# Patient Record
Sex: Female | Born: 1950 | Race: White | Hispanic: No | Smoking: Never smoker
Health system: Southern US, Community
[De-identification: ages and names within clinical notes are randomized; demographics above are authoritative.]

## PROBLEM LIST (undated history)

## (undated) DIAGNOSIS — T8859XA Other complications of anesthesia, initial encounter: Secondary | ICD-10-CM

## (undated) DIAGNOSIS — K5792 Diverticulitis of intestine, part unspecified, without perforation or abscess without bleeding: Secondary | ICD-10-CM

## (undated) DIAGNOSIS — M1990 Unspecified osteoarthritis, unspecified site: Secondary | ICD-10-CM

## (undated) DIAGNOSIS — Z780 Asymptomatic menopausal state: Secondary | ICD-10-CM

## (undated) DIAGNOSIS — R011 Cardiac murmur, unspecified: Secondary | ICD-10-CM

## (undated) DIAGNOSIS — Z8719 Personal history of other diseases of the digestive system: Secondary | ICD-10-CM

## (undated) DIAGNOSIS — M48061 Spinal stenosis, lumbar region without neurogenic claudication: Secondary | ICD-10-CM

## (undated) DIAGNOSIS — E669 Obesity, unspecified: Secondary | ICD-10-CM

## (undated) DIAGNOSIS — L719 Rosacea, unspecified: Secondary | ICD-10-CM

## (undated) DIAGNOSIS — I350 Nonrheumatic aortic (valve) stenosis: Secondary | ICD-10-CM

## (undated) DIAGNOSIS — G8929 Other chronic pain: Secondary | ICD-10-CM

## (undated) DIAGNOSIS — M25569 Pain in unspecified knee: Secondary | ICD-10-CM

## (undated) DIAGNOSIS — I2510 Atherosclerotic heart disease of native coronary artery without angina pectoris: Secondary | ICD-10-CM

## (undated) DIAGNOSIS — M797 Fibromyalgia: Secondary | ICD-10-CM

## (undated) HISTORY — PX: TONSILLECTOMY: SUR1361

## (undated) HISTORY — PX: OTHER SURGICAL HISTORY: SHX169

## (undated) HISTORY — PX: ORTHOPEDIC SURGERY: SHX850

## (undated) HISTORY — DX: Rosacea, unspecified: L71.9

## (undated) HISTORY — DX: Obesity, unspecified: E66.9

## (undated) HISTORY — DX: Fibromyalgia: M79.7

## (undated) HISTORY — DX: Other chronic pain: G89.29

## (undated) HISTORY — PX: ROTATOR CUFF REPAIR: SHX139

## (undated) HISTORY — DX: Unspecified osteoarthritis, unspecified site: M19.90

## (undated) HISTORY — PX: TOTAL KNEE ARTHROPLASTY: SHX125

## (undated) HISTORY — DX: Pain in unspecified knee: M25.569

## (undated) HISTORY — DX: Asymptomatic menopausal state: Z78.0

## (undated) HISTORY — DX: Spinal stenosis, lumbar region without neurogenic claudication: M48.061

## (undated) HISTORY — DX: Diverticulitis of intestine, part unspecified, without perforation or abscess without bleeding: K57.92

---

## 1982-05-18 HISTORY — PX: DILATION AND CURETTAGE OF UTERUS: SHX78

## 1985-05-18 HISTORY — PX: ANKLE SURGERY: SHX546

## 1999-12-24 ENCOUNTER — Other Ambulatory Visit: Admission: RE | Admit: 1999-12-24 | Discharge: 1999-12-24 | Payer: Self-pay | Admitting: *Deleted

## 2004-12-15 ENCOUNTER — Ambulatory Visit: Payer: Self-pay | Admitting: Internal Medicine

## 2005-03-16 ENCOUNTER — Ambulatory Visit: Payer: Self-pay | Admitting: Internal Medicine

## 2005-04-29 ENCOUNTER — Ambulatory Visit: Payer: Self-pay | Admitting: Internal Medicine

## 2005-06-03 ENCOUNTER — Other Ambulatory Visit: Admission: RE | Admit: 2005-06-03 | Discharge: 2005-06-03 | Payer: Self-pay | Admitting: Obstetrics and Gynecology

## 2005-06-26 ENCOUNTER — Ambulatory Visit: Payer: Self-pay | Admitting: Internal Medicine

## 2005-10-26 ENCOUNTER — Ambulatory Visit: Payer: Self-pay | Admitting: Internal Medicine

## 2005-10-29 ENCOUNTER — Ambulatory Visit: Payer: Self-pay | Admitting: Internal Medicine

## 2006-03-02 ENCOUNTER — Ambulatory Visit: Payer: Self-pay | Admitting: Internal Medicine

## 2006-09-03 ENCOUNTER — Ambulatory Visit: Payer: Self-pay | Admitting: Internal Medicine

## 2006-09-05 ENCOUNTER — Encounter: Admission: RE | Admit: 2006-09-05 | Discharge: 2006-09-05 | Payer: Self-pay | Admitting: Internal Medicine

## 2007-03-16 DIAGNOSIS — Z8739 Personal history of other diseases of the musculoskeletal system and connective tissue: Secondary | ICD-10-CM | POA: Insufficient documentation

## 2007-03-16 DIAGNOSIS — N951 Menopausal and female climacteric states: Secondary | ICD-10-CM | POA: Insufficient documentation

## 2007-03-16 DIAGNOSIS — L719 Rosacea, unspecified: Secondary | ICD-10-CM | POA: Insufficient documentation

## 2007-03-21 ENCOUNTER — Ambulatory Visit: Payer: Self-pay | Admitting: Internal Medicine

## 2007-03-21 DIAGNOSIS — M25569 Pain in unspecified knee: Secondary | ICD-10-CM | POA: Insufficient documentation

## 2007-09-14 ENCOUNTER — Telehealth: Payer: Self-pay | Admitting: Internal Medicine

## 2007-11-09 ENCOUNTER — Ambulatory Visit: Payer: Self-pay | Admitting: Internal Medicine

## 2007-11-09 DIAGNOSIS — M1990 Unspecified osteoarthritis, unspecified site: Secondary | ICD-10-CM | POA: Insufficient documentation

## 2007-12-29 ENCOUNTER — Encounter: Payer: Self-pay | Admitting: Internal Medicine

## 2007-12-29 ENCOUNTER — Telehealth: Payer: Self-pay | Admitting: Internal Medicine

## 2008-01-03 ENCOUNTER — Ambulatory Visit: Payer: Self-pay | Admitting: Internal Medicine

## 2008-01-04 ENCOUNTER — Telehealth (INDEPENDENT_AMBULATORY_CARE_PROVIDER_SITE_OTHER): Payer: Self-pay | Admitting: *Deleted

## 2008-01-11 ENCOUNTER — Ambulatory Visit: Payer: Self-pay | Admitting: Internal Medicine

## 2008-01-11 ENCOUNTER — Ambulatory Visit: Payer: Self-pay | Admitting: Cardiovascular Disease

## 2008-01-11 LAB — CONVERTED CEMR LAB
BUN: 16 mg/dL (ref 6–23)
CO2: 27 meq/L (ref 19–32)
Calcium: 9.4 mg/dL (ref 8.4–10.5)
Chloride: 108 meq/L (ref 96–112)
Creatinine, Ser: 0.6 mg/dL (ref 0.4–1.2)
GFR calc Af Amer: 133 mL/min
GFR calc non Af Amer: 110 mL/min
Glucose, Bld: 73 mg/dL (ref 70–99)
Potassium: 4.4 meq/L (ref 3.5–5.1)
Sodium: 142 meq/L (ref 135–145)

## 2008-01-12 ENCOUNTER — Telehealth (INDEPENDENT_AMBULATORY_CARE_PROVIDER_SITE_OTHER): Payer: Self-pay | Admitting: *Deleted

## 2008-01-24 ENCOUNTER — Ambulatory Visit: Payer: Self-pay | Admitting: Internal Medicine

## 2008-01-25 ENCOUNTER — Encounter: Admission: RE | Admit: 2008-01-25 | Discharge: 2008-04-24 | Payer: Self-pay | Admitting: Internal Medicine

## 2008-02-02 ENCOUNTER — Telehealth (INDEPENDENT_AMBULATORY_CARE_PROVIDER_SITE_OTHER): Payer: Self-pay | Admitting: *Deleted

## 2008-02-03 ENCOUNTER — Encounter: Payer: Self-pay | Admitting: Internal Medicine

## 2008-02-10 ENCOUNTER — Telehealth (INDEPENDENT_AMBULATORY_CARE_PROVIDER_SITE_OTHER): Payer: Self-pay | Admitting: *Deleted

## 2008-03-13 ENCOUNTER — Telehealth (INDEPENDENT_AMBULATORY_CARE_PROVIDER_SITE_OTHER): Payer: Self-pay | Admitting: *Deleted

## 2008-03-14 ENCOUNTER — Encounter: Payer: Self-pay | Admitting: Internal Medicine

## 2008-04-02 ENCOUNTER — Ambulatory Visit: Payer: Self-pay | Admitting: Internal Medicine

## 2008-04-02 DIAGNOSIS — K219 Gastro-esophageal reflux disease without esophagitis: Secondary | ICD-10-CM | POA: Insufficient documentation

## 2008-04-18 ENCOUNTER — Telehealth (INDEPENDENT_AMBULATORY_CARE_PROVIDER_SITE_OTHER): Payer: Self-pay | Admitting: *Deleted

## 2008-04-26 ENCOUNTER — Encounter: Payer: Self-pay | Admitting: Internal Medicine

## 2008-04-26 ENCOUNTER — Encounter: Admission: RE | Admit: 2008-04-26 | Discharge: 2008-06-11 | Payer: Self-pay | Admitting: Internal Medicine

## 2008-04-30 ENCOUNTER — Encounter: Payer: Self-pay | Admitting: Internal Medicine

## 2008-05-04 ENCOUNTER — Encounter: Payer: Self-pay | Admitting: Internal Medicine

## 2008-05-09 ENCOUNTER — Ambulatory Visit: Payer: Self-pay | Admitting: Internal Medicine

## 2008-05-10 ENCOUNTER — Ambulatory Visit: Payer: Self-pay | Admitting: Internal Medicine

## 2008-05-14 ENCOUNTER — Encounter (INDEPENDENT_AMBULATORY_CARE_PROVIDER_SITE_OTHER): Payer: Self-pay | Admitting: *Deleted

## 2008-05-17 ENCOUNTER — Telehealth (INDEPENDENT_AMBULATORY_CARE_PROVIDER_SITE_OTHER): Payer: Self-pay | Admitting: *Deleted

## 2008-05-17 LAB — CONVERTED CEMR LAB
ALT: 15 units/L (ref 0–35)
AST: 21 units/L (ref 0–37)
Albumin: 3.5 g/dL (ref 3.5–5.2)
Alkaline Phosphatase: 69 units/L (ref 39–117)
Basophils Absolute: 0 10*3/uL (ref 0.0–0.1)
Basophils Relative: 0.2 % (ref 0.0–3.0)
Bilirubin, Direct: 0.1 mg/dL (ref 0.0–0.3)
Cholesterol: 187 mg/dL (ref 0–200)
Eosinophils Absolute: 0.2 10*3/uL (ref 0.0–0.7)
Eosinophils Relative: 2.9 % (ref 0.0–5.0)
HCT: 41.5 % (ref 36.0–46.0)
HDL: 51.9 mg/dL (ref >39.0)
Hemoglobin: 14.4 g/dL (ref 12.0–15.0)
LDL Cholesterol: 114 mg/dL — ABNORMAL HIGH (ref 0–99)
Lymphocytes Relative: 48.6 % — ABNORMAL HIGH (ref 12.0–46.0)
MCHC: 34.8 g/dL (ref 30.0–36.0)
MCV: 88.9 fL (ref 78.0–100.0)
Monocytes Absolute: 0.4 10*3/uL (ref 0.1–1.0)
Monocytes Relative: 6.9 % (ref 3.0–12.0)
Neutro Abs: 2.5 10*3/uL (ref 1.4–7.7)
Neutrophils Relative %: 41.4 % — ABNORMAL LOW (ref 43.0–77.0)
Platelets: 195 10*3/uL (ref 150–400)
RBC: 4.67 M/uL (ref 3.87–5.11)
RDW: 12 % (ref 11.5–14.6)
TSH: 2.45 microintl units/mL (ref 0.35–5.50)
Total Bilirubin: 0.7 mg/dL (ref 0.3–1.2)
Total CHOL/HDL Ratio: 3.6
Total Protein: 6.4 g/dL (ref 6.0–8.3)
Triglycerides: 106 mg/dL (ref 0–149)
VLDL: 21 mg/dL (ref 0–40)
Vit D, 1,25-Dihydroxy: 15 — ABNORMAL LOW (ref 30–89)
WBC: 6.1 10*3/uL (ref 4.5–10.5)

## 2008-05-25 ENCOUNTER — Telehealth (INDEPENDENT_AMBULATORY_CARE_PROVIDER_SITE_OTHER): Payer: Self-pay | Admitting: *Deleted

## 2008-05-28 ENCOUNTER — Encounter (INDEPENDENT_AMBULATORY_CARE_PROVIDER_SITE_OTHER): Payer: Self-pay | Admitting: *Deleted

## 2008-06-01 ENCOUNTER — Telehealth (INDEPENDENT_AMBULATORY_CARE_PROVIDER_SITE_OTHER): Payer: Self-pay | Admitting: *Deleted

## 2008-06-08 ENCOUNTER — Ambulatory Visit: Payer: Self-pay | Admitting: Internal Medicine

## 2008-07-06 ENCOUNTER — Telehealth (INDEPENDENT_AMBULATORY_CARE_PROVIDER_SITE_OTHER): Payer: Self-pay | Admitting: *Deleted

## 2008-08-14 ENCOUNTER — Ambulatory Visit: Payer: Self-pay | Admitting: Internal Medicine

## 2008-09-28 ENCOUNTER — Telehealth (INDEPENDENT_AMBULATORY_CARE_PROVIDER_SITE_OTHER): Payer: Self-pay | Admitting: *Deleted

## 2008-10-04 ENCOUNTER — Telehealth: Payer: Self-pay | Admitting: Internal Medicine

## 2008-10-31 ENCOUNTER — Telehealth (INDEPENDENT_AMBULATORY_CARE_PROVIDER_SITE_OTHER): Payer: Self-pay | Admitting: *Deleted

## 2008-12-06 ENCOUNTER — Telehealth (INDEPENDENT_AMBULATORY_CARE_PROVIDER_SITE_OTHER): Payer: Self-pay | Admitting: *Deleted

## 2009-01-15 ENCOUNTER — Telehealth (INDEPENDENT_AMBULATORY_CARE_PROVIDER_SITE_OTHER): Payer: Self-pay | Admitting: *Deleted

## 2009-03-22 ENCOUNTER — Telehealth (INDEPENDENT_AMBULATORY_CARE_PROVIDER_SITE_OTHER): Payer: Self-pay | Admitting: *Deleted

## 2009-04-19 ENCOUNTER — Telehealth (INDEPENDENT_AMBULATORY_CARE_PROVIDER_SITE_OTHER): Payer: Self-pay | Admitting: *Deleted

## 2009-05-03 ENCOUNTER — Ambulatory Visit: Payer: Self-pay | Admitting: Internal Medicine

## 2009-07-23 ENCOUNTER — Telehealth (INDEPENDENT_AMBULATORY_CARE_PROVIDER_SITE_OTHER): Payer: Self-pay | Admitting: *Deleted

## 2009-08-27 ENCOUNTER — Telehealth: Payer: Self-pay | Admitting: Internal Medicine

## 2009-09-13 ENCOUNTER — Ambulatory Visit: Payer: Self-pay | Admitting: Internal Medicine

## 2009-09-24 ENCOUNTER — Telehealth: Payer: Self-pay | Admitting: Internal Medicine

## 2009-10-28 ENCOUNTER — Encounter: Admission: RE | Admit: 2009-10-28 | Discharge: 2010-01-07 | Payer: Self-pay | Admitting: Sports Medicine

## 2009-10-31 ENCOUNTER — Telehealth: Payer: Self-pay | Admitting: Internal Medicine

## 2010-01-17 ENCOUNTER — Telehealth: Payer: Self-pay | Admitting: Internal Medicine

## 2010-02-24 ENCOUNTER — Telehealth: Payer: Self-pay | Admitting: Internal Medicine

## 2010-04-14 ENCOUNTER — Telehealth: Payer: Self-pay | Admitting: Internal Medicine

## 2010-05-05 ENCOUNTER — Encounter: Payer: Self-pay | Admitting: Internal Medicine

## 2010-05-05 ENCOUNTER — Ambulatory Visit: Payer: Self-pay | Admitting: Internal Medicine

## 2010-05-05 DIAGNOSIS — E559 Vitamin D deficiency, unspecified: Secondary | ICD-10-CM | POA: Insufficient documentation

## 2010-05-07 ENCOUNTER — Encounter (INDEPENDENT_AMBULATORY_CARE_PROVIDER_SITE_OTHER): Payer: Self-pay | Admitting: *Deleted

## 2010-05-13 ENCOUNTER — Ambulatory Visit
Admission: RE | Admit: 2010-05-13 | Discharge: 2010-05-13 | Payer: Self-pay | Source: Home / Self Care | Attending: Internal Medicine | Admitting: Internal Medicine

## 2010-05-14 ENCOUNTER — Ambulatory Visit: Payer: Self-pay | Admitting: Internal Medicine

## 2010-05-20 LAB — CONVERTED CEMR LAB: Fecal Occult Bld: NEGATIVE

## 2010-05-26 ENCOUNTER — Encounter: Payer: Self-pay | Admitting: Internal Medicine

## 2010-05-26 ENCOUNTER — Other Ambulatory Visit: Payer: Self-pay | Admitting: Internal Medicine

## 2010-05-26 LAB — BASIC METABOLIC PANEL
BUN: 14 mg/dL (ref 6–23)
CO2: 29 mEq/L (ref 19–32)
Calcium: 9.6 mg/dL (ref 8.4–10.5)
Chloride: 106 mEq/L (ref 96–112)
Creatinine, Ser: 0.5 mg/dL (ref 0.4–1.2)
GFR: 131.2 mL/min (ref >60.00)
Glucose, Bld: 100 mg/dL — ABNORMAL HIGH (ref 70–99)
Potassium: 4.7 mEq/L (ref 3.5–5.1)
Sodium: 142 mEq/L (ref 135–145)

## 2010-05-26 LAB — LIPID PANEL
Cholesterol: 204 mg/dL — ABNORMAL HIGH (ref 0–200)
HDL: 59.2 mg/dL (ref >39.00)
Total CHOL/HDL Ratio: 3
Triglycerides: 68 mg/dL (ref 0.0–149.0)
VLDL: 13.6 mg/dL (ref 0.0–40.0)

## 2010-05-26 LAB — CBC WITH DIFFERENTIAL/PLATELET
Basophils Absolute: 0 10*3/uL (ref 0.0–0.1)
Basophils Relative: 0.3 % (ref 0.0–3.0)
Eosinophils Absolute: 0.2 10*3/uL (ref 0.0–0.7)
Eosinophils Relative: 2.6 % (ref 0.0–5.0)
HCT: 45.7 % (ref 36.0–46.0)
Hemoglobin: 15.3 g/dL — ABNORMAL HIGH (ref 12.0–15.0)
Lymphocytes Relative: 27.3 % (ref 12.0–46.0)
Lymphs Abs: 1.8 10*3/uL (ref 0.7–4.0)
MCHC: 33.4 g/dL (ref 30.0–36.0)
MCV: 90.6 fl (ref 78.0–100.0)
Monocytes Absolute: 0.4 10*3/uL (ref 0.1–1.0)
Monocytes Relative: 5.9 % (ref 3.0–12.0)
Neutro Abs: 4.3 10*3/uL (ref 1.4–7.7)
Neutrophils Relative %: 63.9 % (ref 43.0–77.0)
Platelets: 205 10*3/uL (ref 150.0–400.0)
RBC: 5.04 Mil/uL (ref 3.87–5.11)
RDW: 13.1 % (ref 11.5–14.6)
WBC: 6.7 10*3/uL (ref 4.5–10.5)

## 2010-05-26 LAB — CONVERTED CEMR LAB: Vit D, 25-Hydroxy: 27 ng/mL — ABNORMAL LOW (ref 30–89)

## 2010-05-26 LAB — LDL CHOLESTEROL, DIRECT: Direct LDL: 143.4 mg/dL

## 2010-06-09 ENCOUNTER — Ambulatory Visit (HOSPITAL_COMMUNITY)
Admission: RE | Admit: 2010-06-09 | Discharge: 2010-06-09 | Payer: Self-pay | Source: Home / Self Care | Attending: Internal Medicine | Admitting: Internal Medicine

## 2010-06-09 ENCOUNTER — Encounter: Payer: Self-pay | Admitting: Internal Medicine

## 2010-06-17 NOTE — Letter (Signed)
Summary: Handicapped Placard/[facility]CDMV  Handicapped Placard/[facility]CD[facility]   Imported By: Lanelle Bal 09/18/2009 09:28:11  _____________________________________________________________________  External Attachment:    Type:   Image     Comment:   External Document

## 2010-06-17 NOTE — Letter (Signed)
Summary: Disability Statement for IRS  Disability Statement for IRS   Imported By: Lanelle Bal 09/18/2009 09:26:15  _____________________________________________________________________  External Attachment:    Type:   Image     Comment:   External Document

## 2010-06-17 NOTE — Progress Notes (Signed)
Summary: Refill Request  Phone Note Refill Request Call back at [phone] Message from:  Pharmacy on April 14, 2010 8:49 AM  Refills Requested: Medication #1:  FLEXERIL 10 MG  TABS take 1 tablet two times a day as needed by mouth   Dosage confirmed as above?Dosage Confirmed   Supply Requested: 1 month   Last Refilled: 02/24/2010  Medication #2:  HYDROCODONE-IBUPROFEN 7.5-200 MG  TABS 1 by mouth q 4hour as needed   Dosage confirmed as above?Dosage Confirmed   Supply Requested: 1 month   Last Refilled: [facility]/14/2010 [facility] [address]   Next Appointment Scheduled: 12.19.11 Initial call taken by: Harold Barban,  April 14, 2010 8:51 AM  Follow-up for Phone Call        ok 60 and 3 RF advise patient: she is due for a followup, please arrange  Follow-up by: [facility] E. Paz MD,  April 14, 2010 3:32 PM  Additional Follow-up for Phone Call Additional follow up Details #1::        I have not faxed yet, do you want me to do 60 and 3 rf on the Hydrocodone also? Additional Follow-up by: [name] CMA,  April 14, 2010 3:35 PM    Additional Follow-up for Phone Call Additional follow up Details #2::    60 and 1 RF on hydrocodone Jose E. Paz MD  April 14, 2010 5:01 PM    Prescriptions: FLEXERIL 10 MG  TABS (CYCLOBENZAPRINE HCL) take 1 tablet two times a day as needed by mouth  #60 x 3   Entered by:   [name] CMA   Authorized by:   Nolon Rod. Paz MD   Signed by:   [name] CMA on 04/15/2010   Method used:   Reprint   RxID:   [id number] HYDROCODONE-IBUPROFEN 7.5-200 MG  TABS (HYDROCODONE-IBUPROFEN) 1 by mouth q 4hour as needed  #60 x 1   Entered by:   [name] CMA   Authorized by:   Nolon Rod. Paz MD   Signed by:   [name] CMA on 04/15/2010   Method used:   Reprint   RxID:   [id number] HYDROCODONE-IBUPROFEN 7.5-200 MG  TABS (HYDROCODONE-IBUPROFEN) 1 by mouth q 4hour as needed  #60 x 3   Entered by:   [name] CMA  Authorized by:   Nolon Rod. Paz MD   Signed by:   [name] CMA on 04/14/2010   Method used:   Printed then faxed to ...       [facility] (retail)       [address]       [place], [place]  [address]       Ph: [phone]       Fax: [phone]   RxID:   [id number] FLEXERIL 10 MG  TABS (CYCLOBENZAPRINE HCL) take 1 tablet two times a day as needed by mouth  #60 x 3   Entered by:   [name] CMA   Authorized by:   Nolon Rod. Paz MD   Signed by:   [name] CMA on 04/14/2010   Method used:   Printed then faxed to ...       [facility] (retail)       [address]       [place], [place]  [address]  Ph: [phone]       Fax: [phone]   RxID:   [id number]

## 2010-06-17 NOTE — Progress Notes (Signed)
Summary: Refill Request  Phone Note Refill Request Call back at [phone] Message from:  Pharmacy on Sep 24, 2009 10:50 AM  Refills Requested: Medication #1:  HYDROCODONE-IBUPROFEN 7.5-200 MG  TABS 1 by mouth q 4hour as needed   Dosage confirmed as above?Dosage Confirmed   Supply Requested: 3 months   Last Refilled: 08/27/2009 [name] [address].   Next Appointment Scheduled: 10.31.11 Initial call taken by: Harold Barban,  Sep 24, 2009 10:54 AM  Follow-up for Phone Call        #100 witn no refills on 08/27/09 Shary Decamp  Sep 24, 2009 11:51 AM ok 100, no Rf [name] E. Burnie Hank MD  Sep 24, 2009 1:16 PM     Prescriptions: HYDROCODONE-IBUPROFEN 7.5-200 MG  TABS (HYDROCODONE-IBUPROFEN) 1 by mouth q 4hour as needed  #100 x 0   Entered by:   Shary Decamp   Authorized by:   Nolon Rod. Harlene Petralia MD   Signed by:   Shary Decamp on 09/24/2009   Method used:   Printed then faxed to ...       [name] (retail)       [address]       [place], [place]  [address]       Ph: [phone]       Fax: [phone]   RxID:   [id number]

## 2010-06-17 NOTE — Progress Notes (Signed)
Summary: refill  Phone Note Refill Request Message from:  Fax from Pharmacy on February 24, 2010 10:44 AM  Refills Requested: Medication #1:  FLEXERIL 10 MG  TABS take 1 tablet two times a day as needed by mouth [facility] - fax [phone]  Initial call taken by: Okey Regal Spring,  February 24, 2010 11:10 AM  Follow-up for Phone Call        last refilled 01/21/10. Follow-up by: [name] CMA,  February 24, 2010 11:27 AM  Additional Follow-up for Phone Call Additional follow up Details #1::        ok 60, no RF Additional Follow-up by: [facility] E. Mordechai Matuszak MD,  February 24, 2010 1:03 PM    Prescriptions: FLEXERIL 10 MG  TABS (CYCLOBENZAPRINE HCL) take 1 tablet two times a day as needed by mouth  #60 x 0   Entered by:   [name] CMA   Authorized by:   Nolon Rod. Aasim Restivo MD   Signed by:   [name] CMA on 02/24/2010   Method used:   Electronically to        [facility] (retail)       [address]       [place], [place]  [address]       Ph: [phone]       Fax: [phone]   RxID:   [id number]

## 2010-06-17 NOTE — Progress Notes (Signed)
Summary: Rx Refill (lmom 9/2, 9/6)  Phone Note Call from Patient Call back at Home Phone [phone]   Caller: Patient Summary of Call: Patient called and said she needed a refill on her cyclobenzaprine and wanted to know why the pharmacy would not refill it. I informed her that we had denied it because she should still have 1 refill left on it. She said the pharmacy had told her she did not. I told her we had given her a new rx in April of 2011 with 6 refills on it. She said she thinks the pharmacy had lost it because she was told they were still filling off the rx from 04/2008. I told her I would confirm with the pharmacist.  Initial call taken by: Harold Barban,  January 17, 2010 11:14 AM  Follow-up for Phone Call        Sp[facility] with the [facility]ist [facility] and he confirmed that they had been filling the med off of the 04/2008 script. She ran out last month. He said she never brought in another rx so it was possible she lost it or is having it filled somewhere else.   Spoke with patient again and she said that she believes that she took the new rx in and the pharmacy lost it.  Follow-up by: Harold Barban,  January 17, 2010 11:14 AM  Additional Follow-up for Phone Call Additional follow up Details #1::        When on the phone with the pharmacist, gave pt 1 month 1  only until more refills authorized by dr. [name] CMA  January 17, 2010 11:16 AM     Additional Follow-up for Phone Call Additional follow up Details #2::    patient is responsible for the prescriptions she gets. Will prescribe only when she runs out according to our records. Kaegan Hettich E. Nixon Kolton MD  January 17, 2010 2:19 PM    Left message for pt to call back. [name] CMA  January 17, 2010 2:35 PM  lmtcb.Harold Barban  January 21, 2010 11:42 AM   Prescriptions: FLEXERIL 10 MG  TABS (CYCLOBENZAPRINE HCL) take 1 tablet two times a day as needed by mouth  #60 x 0   Entered by:    Nolon Rod. Marvene Strohm MD   Authorized by:   [name] CMA   Signed by:   Nolon Rod. Artice Holohan MD on 01/21/2010   Method used:   Telephoned to ...       [facility] (retail)       [address]       [place], [place]  [address]       Ph: [phone]       Fax: [phone]   RxID:   [id number]

## 2010-06-17 NOTE — Progress Notes (Signed)
Summary: Refill Request  Phone Note Refill Request Message from:  [facility] on [facility] [address] Fax #: [phone]  Refills Requested: Medication #1:  HYDROCODONE-IBUPROFEN 7.5-200 MG  TABS 1 by mouth q 4hour as needed   Dosage confirmed as above?Dosage Confirmed   Supply Requested: 1 month   Last Refilled: 06/19/2009   Notes: was rx'd # 100 with 1 refill on 05/03/09 Next Appointment Scheduled: last ov 04/26/2009 - next ov is scheduled for 09/13/09 Initial call taken by: Shary Decamp,  July 23, 2009 10:00 AM  Follow-up for Phone Call        100, no RF  Follow-up by: Nolon Rod. Paz MD,  July 23, 2009 5:08 PM    Prescriptions: HYDROCODONE-IBUPROFEN 7.5-200 MG  TABS (HYDROCODONE-IBUPROFEN) 1 by mouth q 4hour as needed  #100 x 0   Entered by:   Kandice Hams   Authorized by:   Nolon Rod. Paz MD   Signed by:   Kandice Hams on 07/24/2009   Method used:   Printed then faxed to ...       [facility] (retail)       [address]       [place], [place]  [address]       Ph: [phone]       Fax: [phone]   RxID:   [id number]

## 2010-06-17 NOTE — Progress Notes (Signed)
Summary: REFILL  Phone Note Refill Request Message from:  Fax from Pharmacy on August 27, 2009 11:40 AM  Refills Requested: Medication #1:  HYDROCODONE-IBUPROFEN 7.5-200 MG  TABS 1 by mouth q 4hour as needed   Last Refilled: 07/24/2009   Notes: [address] FAX [phone]   Method Requested: Fax to Local Pharmacy Next Appointment Scheduled: 09/13/2009 Initial call taken by: Shary Decamp,  August 27, 2009 1:19 PM  Follow-up for Phone Call        100, no RF Butch Otterson E. Janthony Holleman MD  August 27, 2009 2:32 PM     Prescriptions: HYDROCODONE-IBUPROFEN 7.5-200 MG  TABS (HYDROCODONE-IBUPROFEN) 1 by mouth q 4hour as needed  #100 x 0   Entered by:   Shary Decamp   Authorized by:   Nolon Rod. Kiyomi Pallo MD   Signed by:   Shary Decamp on 08/27/2009   Method used:   Printed then faxed to ...       [facility] (retail)       [address]       [place], [place]  [address]       Ph: [phone]       Fax: [phone]   RxID:   [id number]

## 2010-06-17 NOTE — Progress Notes (Signed)
Summary: refill  Phone Note Refill Request Message from:  Fax from Pharmacy on October 31, 2009 10:47 AM  Refills Requested: Medication #1:  HYDROCODONE-IBUPROFEN 7.5-200 MG  TABS 1 by mouth q 4hour as needed [facility] - fax [phone] - p [phone]   Initial call taken by: Okey Regal Spring,  October 31, 2009 10:48 AM  Follow-up for Phone Call        last filled 09-24-09 #100, last OV 09-13-09..............Marland KitchenFelecia Deloach CMA  October 31, 2009 12:52 PM   ok 100, 1 RF Tamra Koos E. Steve Youngberg MD  November 01, 2009 9:06 AM     Prescriptions: HYDROCODONE-IBUPROFEN 7.5-200 MG  TABS (HYDROCODONE-IBUPROFEN) 1 by mouth q 4hour as needed  #100 x 1   Entered by:   Jeremy Johann CMA   Authorized by:   Nolon Rod. Alexiya Franqui MD   Signed by:   Jeremy Johann CMA on 11/01/2009   Method used:   Printed then faxed to ...       [facility] ([facility]ail)       [address]       [place], [place]  [address]       Ph: [phone]       Fax: [phone]   RxID:   [id number]
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
## 2010-06-19 NOTE — Letter (Signed)
Summary: [facility]: Immunoassay Fecal Occult Blood (iFOB) Order Form  [facility] [place]  [address] [place], [place] [address]   Phone: [phone]  Fax: [phone]      [facility]: Immunoassay Fecal Occult Blood (iFOB) Order Form   May 07, 2010 MRN: [id number]   [name] [birth year]   Physicican Name:____jose paz,md ____________________  Diagnosis Code:______v76.51____________________      [name] CMA
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
## 2010-07-11 ENCOUNTER — Encounter: Payer: Self-pay | Admitting: Internal Medicine

## 2010-07-11 ENCOUNTER — Ambulatory Visit (INDEPENDENT_AMBULATORY_CARE_PROVIDER_SITE_OTHER): Payer: Medicare Other | Admitting: Internal Medicine

## 2010-07-11 DIAGNOSIS — M899 Disorder of bone, unspecified: Secondary | ICD-10-CM

## 2010-07-11 DIAGNOSIS — M8580 Other specified disorders of bone density and structure, unspecified site: Secondary | ICD-10-CM | POA: Insufficient documentation
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
## 2010-07-24 NOTE — Letter (Signed)
Summary: Medical Statement of Disability  Medical Statement of Disability   Imported By: Maryln Gottron 07/15/2010 09:54:21  _____________________________________________________________________  External Attachment:    Type:   Image     Comment:   External Document

## 2010-09-16 ENCOUNTER — Other Ambulatory Visit: Payer: Self-pay | Admitting: *Deleted

## 2010-09-16 NOTE — Telephone Encounter (Signed)
90, 2 RF

## 2010-09-17 MED ORDER — HYDROCODONE-IBUPROFEN 7.5-200 MG PO TABS: 1.0000 | ORAL_TABLET | ORAL | Status: DC | PRN

## 2010-10-01 ENCOUNTER — Ambulatory Visit (INDEPENDENT_AMBULATORY_CARE_PROVIDER_SITE_OTHER): Payer: Medicare Other | Admitting: Internal Medicine

## 2010-10-01 ENCOUNTER — Encounter: Payer: Self-pay | Admitting: Internal Medicine

## 2010-10-01 DIAGNOSIS — M949 Disorder of cartilage, unspecified: Secondary | ICD-10-CM

## 2010-10-01 DIAGNOSIS — R059 Cough, unspecified: Secondary | ICD-10-CM

## 2010-10-01 DIAGNOSIS — R05 Cough: Secondary | ICD-10-CM | POA: Insufficient documentation

## 2010-10-01 DIAGNOSIS — M899 Disorder of bone, unspecified: Secondary | ICD-10-CM
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
## 2010-10-01 NOTE — Assessment & Plan Note (Signed)
Doing great, has lost several pounds. Encouraged to continue with her diet. She likes to do some exercise and will call for a written prescription so she can use the hospital facilities.

## 2010-10-01 NOTE — Assessment & Plan Note (Signed)
Currently on calcium, vitamin D. Her last vitamin D was low, she took ergocalciferol.

## 2010-10-01 NOTE — Assessment & Plan Note (Signed)
Had cough for 2 weeks, currently better. Has developed pain in the chest, see history of present illness. Pain is likely musculoskeletal from cough. Recommend observation for now. Will call if no better in few days.

## 2010-11-13 ENCOUNTER — Other Ambulatory Visit: Payer: Self-pay | Admitting: Internal Medicine

## 2010-11-13 NOTE — Telephone Encounter (Signed)
60, 3 RF 

## 2010-11-14 ENCOUNTER — Ambulatory Visit: Payer: Self-pay | Admitting: Internal Medicine

## 2011-02-09 ENCOUNTER — Other Ambulatory Visit: Payer: Self-pay | Admitting: Internal Medicine

## 2011-02-09 MED ORDER — HYDROCODONE-IBUPROFEN 7.5-200 MG PO TABS: 1.0000 | ORAL_TABLET | ORAL | Status: DC | PRN

## 2011-02-09 NOTE — Telephone Encounter (Signed)
Vicoprofen 7.5/200 mg request [last refill 09/17/10 #90x2 last OV 10/01/10]

## 2011-02-09 NOTE — Telephone Encounter (Signed)
Ok 90 and 3 RF 

## 2011-02-09 NOTE — Telephone Encounter (Signed)
Done

## 2011-04-27 ENCOUNTER — Other Ambulatory Visit: Payer: Self-pay | Admitting: Internal Medicine

## 2011-05-15 ENCOUNTER — Encounter: Payer: Medicare Other | Admitting: Internal Medicine

## 2011-06-12 ENCOUNTER — Encounter: Payer: Medicare Other | Admitting: Internal Medicine

## 2011-07-06 ENCOUNTER — Other Ambulatory Visit: Payer: Self-pay | Admitting: Internal Medicine

## 2011-07-06 NOTE — Telephone Encounter (Signed)
Refill request vicoprofen 7.5-200mg  #90 with 2 refills. Last refilled 9.24.12. OK to refill?

## 2011-07-08 NOTE — Telephone Encounter (Signed)
Tell pt ok #90, no RF, no further RF w/o OV

## 2011-07-08 NOTE — Telephone Encounter (Signed)
Refill done.  

## 2011-07-10 ENCOUNTER — Encounter: Payer: Medicare Other | Admitting: Internal Medicine

## 2011-07-17 ENCOUNTER — Encounter: Payer: Medicare Other | Admitting: Internal Medicine

## 2011-08-07 ENCOUNTER — Encounter: Payer: Medicare Other | Admitting: Internal Medicine

## 2011-08-10 ENCOUNTER — Other Ambulatory Visit: Payer: Self-pay | Admitting: Internal Medicine

## 2011-08-10 NOTE — Telephone Encounter (Signed)
Refill request Vicoprofen 7.5-200mg . #90 with zero refills. Last refilled on 2.18.13. Ok to refill?

## 2011-08-11 ENCOUNTER — Other Ambulatory Visit: Payer: Self-pay | Admitting: Internal Medicine

## 2011-08-11 NOTE — Telephone Encounter (Signed)
Refill request Vicoprofen 7.5-200mg #90 with zero refills. Last refilled on 2.18.13 OK to refill? 

## 2011-08-13 ENCOUNTER — Other Ambulatory Visit: Payer: Self-pay | Admitting: Internal Medicine

## 2011-08-13 NOTE — Telephone Encounter (Signed)
Refill request Vicoprofen 7.5-200mg  #90 with zero refills. Last refilled on 2.18.13 OK to refill?

## 2011-08-13 NOTE — Telephone Encounter (Signed)
Patient called and requested RX. She has an appending apt in April 2013 advise    KP

## 2011-08-13 NOTE — Telephone Encounter (Signed)
duplicate

## 2011-08-13 NOTE — Telephone Encounter (Signed)
90, 0 RF 

## 2011-09-03 NOTE — Telephone Encounter (Signed)
She has an appt scheduled for 4.22.13. OK to go ahead and refill?

## 2011-09-07 ENCOUNTER — Encounter: Payer: Self-pay | Admitting: Internal Medicine

## 2011-09-07 ENCOUNTER — Ambulatory Visit (INDEPENDENT_AMBULATORY_CARE_PROVIDER_SITE_OTHER): Payer: Medicare Other | Admitting: Internal Medicine

## 2011-09-07 VITALS — BP 122/84 | HR 94 | Temp 98.8°F | Ht 68.0 in | Wt 274.2 lb

## 2011-09-07 DIAGNOSIS — M949 Disorder of cartilage, unspecified: Secondary | ICD-10-CM

## 2011-09-07 DIAGNOSIS — M899 Disorder of bone, unspecified: Secondary | ICD-10-CM

## 2011-09-07 DIAGNOSIS — E559 Vitamin D deficiency, unspecified: Secondary | ICD-10-CM

## 2011-09-07 DIAGNOSIS — M1990 Unspecified osteoarthritis, unspecified site: Secondary | ICD-10-CM

## 2011-09-07 DIAGNOSIS — Z0000 Encounter for general adult medical examination without abnormal findings: Secondary | ICD-10-CM

## 2011-09-07 DIAGNOSIS — IMO0001 Reserved for inherently not codable concepts without codable children: Secondary | ICD-10-CM

## 2011-09-07 MED ORDER — ZOSTER VACCINE LIVE 19400 UNT/0.65ML ~~LOC~~ SOLR: 0.6500 mL | Freq: Once | SUBCUTANEOUS | Status: DC

## 2011-09-07 MED ORDER — HYDROCODONE-IBUPROFEN 7.5-200 MG PO TABS: 1.0000 | ORAL_TABLET | ORAL | Status: DC | PRN

## 2011-09-07 NOTE — Assessment & Plan Note (Signed)
Bone density test  1- 23-2012 -------> T score -2.2 On ca and vit d

## 2011-09-07 NOTE — Assessment & Plan Note (Signed)
Sees Dr. Thamas Jaegers orthopedic surgeon routinely. Pain is managed with pain medicine, TENS unit, local heat, stretching, chiropractor manipulation, Rohlfing twice a month.

## 2011-09-07 NOTE — Patient Instructions (Signed)
Please come back fasting for the following glands FLP, BMP, TSH---- dx  278.01 Vitamin D--- dx  vitamin D deficiency

## 2011-09-07 NOTE — Assessment & Plan Note (Signed)
Labs , on OTC supplements

## 2011-09-07 NOTE — Assessment & Plan Note (Addendum)
Doing well with diet, has lost several pounds. As far as exercise, she would like to do more physical therapy or simply exercise  at [facility], states will call for a referral in the summer

## 2011-09-07 NOTE — Assessment & Plan Note (Addendum)
Td-- 2009  zostavax immunization discussed, explained the benefits--- rx provided   Las saw Dr. Edward Jolly 06-2010, reports a MMG 2012,due for a visit , encouraged to call them   never colonoscopy, neg  iFOB before  discussed the benefits of a colonoscopy, a family member had com[plications from a Cscope, she is afraid. Counseled, those are rare events, will refer to GI  Diet discuss, encouraged to be as active as possible  labs.
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
## 2011-09-07 NOTE — Assessment & Plan Note (Signed)
Pain is managed with pain medicine, TENS unit, local heat, stretching, chiropractor manipulation, Rohlfing twice a month.

## 2011-09-08 ENCOUNTER — Encounter: Payer: Self-pay | Admitting: Internal Medicine

## 2011-09-09 ENCOUNTER — Encounter: Payer: Self-pay | Admitting: Internal Medicine

## 2011-09-11 ENCOUNTER — Encounter: Payer: Medicare Other | Admitting: Internal Medicine

## 2011-09-18 ENCOUNTER — Other Ambulatory Visit: Payer: Medicare Other

## 2011-09-28 ENCOUNTER — Other Ambulatory Visit (INDEPENDENT_AMBULATORY_CARE_PROVIDER_SITE_OTHER): Payer: Medicare Other

## 2011-09-28 DIAGNOSIS — E559 Vitamin D deficiency, unspecified: Secondary | ICD-10-CM

## 2011-09-28 DIAGNOSIS — Z79899 Other long term (current) drug therapy: Secondary | ICD-10-CM

## 2011-09-28 LAB — BASIC METABOLIC PANEL
BUN: 17 mg/dL (ref 6–23)
CO2: 26 mEq/L (ref 19–32)
GFR: 89.15 mL/min (ref >60.00)
Glucose, Bld: 92 mg/dL (ref 70–99)
Potassium: 4.2 mEq/L (ref 3.5–5.1)

## 2011-09-28 LAB — CBC WITH DIFFERENTIAL/PLATELET
Basophils Absolute: 0 10*3/uL (ref 0.0–0.1)
Basophils Relative: 0.4 % (ref 0.0–3.0)
Eosinophils Absolute: 0.2 10*3/uL (ref 0.0–0.7)
Hemoglobin: 14.5 g/dL (ref 12.0–15.0)
Lymphocytes Relative: 31.5 % (ref 12.0–46.0)
Lymphs Abs: 2.5 10*3/uL (ref 0.7–4.0)
MCHC: 33.2 g/dL (ref 30.0–36.0)
MCV: 89 fl (ref 78.0–100.0)
Monocytes Absolute: 0.5 10*3/uL (ref 0.1–1.0)
Neutro Abs: 4.6 10*3/uL (ref 1.4–7.7)
RBC: 4.9 Mil/uL (ref 3.87–5.11)
RDW: 13.5 % (ref 11.5–14.6)

## 2011-09-28 LAB — LIPID PANEL
Cholesterol: 193 mg/dL (ref 0–200)
HDL: 72.2 mg/dL (ref >39.00)
VLDL: 14.4 mg/dL (ref 0.0–40.0)

## 2011-09-28 NOTE — Progress Notes (Signed)
Labs only

## 2011-09-29 ENCOUNTER — Encounter: Payer: Self-pay | Admitting: *Deleted

## 2011-10-01 LAB — VITAMIN D 1,25 DIHYDROXY
Vitamin D 1, 25 (OH)2 Total: 52 pg/mL (ref 18–72)
Vitamin D2 1, 25 (OH)2: <8 pg/mL
Vitamin D3 1, 25 (OH)2: 52 pg/mL

## 2011-10-27 ENCOUNTER — Encounter: Payer: Medicare Other | Admitting: Internal Medicine

## 2011-12-01 ENCOUNTER — Other Ambulatory Visit: Payer: Self-pay | Admitting: Internal Medicine

## 2011-12-01 NOTE — Telephone Encounter (Signed)
Last OV 08-10-11 #60 1, last OV 09-07-11

## 2011-12-02 NOTE — Telephone Encounter (Signed)
Ok 60, 5 RF 

## 2011-12-02 NOTE — Telephone Encounter (Signed)
Refill done.  

## 2012-01-11 ENCOUNTER — Other Ambulatory Visit: Payer: Self-pay | Admitting: Internal Medicine

## 2012-01-11 NOTE — Telephone Encounter (Signed)
Ok to refill 

## 2012-01-11 NOTE — Telephone Encounter (Signed)
90, 2 RF 

## 2012-01-12 NOTE — Telephone Encounter (Signed)
Refill done.  

## 2012-05-09 ENCOUNTER — Other Ambulatory Visit: Payer: Self-pay | Admitting: Internal Medicine

## 2012-05-09 ENCOUNTER — Encounter: Payer: Self-pay | Admitting: *Deleted

## 2012-05-09 MED ORDER — HYDROCODONE-IBUPROFEN 7.5-200 MG PO TABS: 1.0000 | ORAL_TABLET | Freq: Four times a day (QID) | ORAL | Status: DC | PRN

## 2012-05-09 NOTE — Telephone Encounter (Signed)
Last OV 4.22.13 OK to refill?

## 2012-05-09 NOTE — Telephone Encounter (Signed)
refill Hydrocodone-Ibuprofen (Tab) 7.5-200 MG TAKE 1 TABLET EVERY 4 HOURS AS NEEDED #90 last fill 11.23.13

## 2012-05-09 NOTE — Telephone Encounter (Signed)
Refill done.  Left detailed msg on pt's vmail advising her that she is due for an OV.

## 2012-05-09 NOTE — Telephone Encounter (Signed)
Advise patient: Needs ov, please arrange. Ok 90, no RF

## 2012-05-20 ENCOUNTER — Telehealth: Payer: Self-pay | Admitting: Internal Medicine

## 2012-05-20 NOTE — Telephone Encounter (Signed)
per pt refill, pt called coming in for meds mgmt 1.13.14 and scheduled CPE for 4.28.14 Just an FYI--no need to callback

## 2012-05-30 ENCOUNTER — Ambulatory Visit (INDEPENDENT_AMBULATORY_CARE_PROVIDER_SITE_OTHER): Payer: Medicare Other | Admitting: Internal Medicine

## 2012-05-30 VITALS — BP 138/82 | HR 92 | Temp 98.0°F | Wt 288.0 lb

## 2012-05-30 DIAGNOSIS — M1990 Unspecified osteoarthritis, unspecified site: Secondary | ICD-10-CM

## 2012-05-30 DIAGNOSIS — IMO0001 Reserved for inherently not codable concepts without codable children: Secondary | ICD-10-CM

## 2012-05-30 MED ORDER — CYCLOBENZAPRINE HCL 10 MG PO TABS: 10.0000 mg | ORAL_TABLET | Freq: Two times a day (BID) | ORAL | Status: DC | PRN

## 2012-05-30 MED ORDER — HYDROCODONE-IBUPROFEN 7.5-200 MG PO TABS: 1.0000 | ORAL_TABLET | Freq: Four times a day (QID) | ORAL | Status: DC | PRN
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
## 2012-05-30 NOTE — Assessment & Plan Note (Addendum)
Recent exacerbation of back pain with radicular symptoms (L4?), reports several episodes like this one in the last few years. Chart reviewed, in 2008 had a back MRI:  IMPRESSION:  1. 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 Combined with milder multilevel disc degeneration results in moderate spinal stenosis at L3-L4 and mild spinal stenosis at L2-L3 and L4-L5.  2. Moderate left neural foraminal stenosis at L4-L5 due to combined changes.   At this point, she is doing better, she will see orthopedist Dr Thamas Jaegers in April. Recommend to see him ASAP if symptoms resurface. Had a toxicology screen December 2013, low risk it will be scan

## 2012-05-30 NOTE — Assessment & Plan Note (Signed)
Seems stable 

## 2012-05-30 NOTE — Patient Instructions (Addendum)
Schedule an appointment for a physical for April 2014

## 2012-05-31 ENCOUNTER — Encounter: Payer: Self-pay | Admitting: Internal Medicine

## 2012-06-10 ENCOUNTER — Encounter: Payer: Self-pay | Admitting: Internal Medicine

## 2012-07-12 ENCOUNTER — Telehealth: Payer: Self-pay | Admitting: Internal Medicine

## 2012-07-12 MED ORDER — HYDROCODONE-IBUPROFEN 7.5-200 MG PO TABS: 1.0000 | ORAL_TABLET | Freq: Four times a day (QID) | ORAL | Status: DC | PRN

## 2012-07-12 NOTE — Telephone Encounter (Signed)
done

## 2012-07-12 NOTE — Telephone Encounter (Signed)
Ok to refill? Last OV 1.13.14 Last filled 1.13.14

## 2012-07-12 NOTE — Telephone Encounter (Signed)
refill  Hydrocodone-Ibuprofen (Tab) 7.5-200 MG Take 1 tablet by mouth every 6 (six) hours as needed for pain. no qty or last fill date listed

## 2012-07-12 NOTE — Telephone Encounter (Signed)
Faxed rx

## 2012-09-10 ENCOUNTER — Telehealth: Payer: Self-pay | Admitting: Internal Medicine

## 2012-09-12 ENCOUNTER — Encounter: Payer: Medicare Other | Admitting: Internal Medicine

## 2012-09-12 NOTE — Telephone Encounter (Signed)
Done, be sure she has a screening urine test

## 2012-09-12 NOTE — Telephone Encounter (Signed)
Ok to refill? Last OV 1.13.14 Last filled 2.25.14

## 2012-10-24 ENCOUNTER — Encounter: Payer: Medicare Other | Admitting: Internal Medicine

## 2012-10-27 ENCOUNTER — Telehealth: Payer: Self-pay | Admitting: General Practice

## 2012-10-27 MED ORDER — HYDROCODONE-IBUPROFEN 7.5-200 MG PO TABS: ORAL_TABLET | ORAL | Status: DC

## 2012-10-27 NOTE — Telephone Encounter (Signed)
Low risk UDS 12-13 Ok RF x 1 month due for OV, if she doesn't have one schedule, please arrange

## 2012-10-27 NOTE — Telephone Encounter (Signed)
Pt has appt scheduled 6.17.14 rx faxed.

## 2012-10-27 NOTE — Telephone Encounter (Signed)
Hydrocodone refill Last OV 05-30-2012 Med filled 09-10-2012 #90 with 1 refill  [facility]

## 2012-11-01 ENCOUNTER — Encounter: Payer: Self-pay | Admitting: Internal Medicine

## 2012-11-01 ENCOUNTER — Ambulatory Visit (INDEPENDENT_AMBULATORY_CARE_PROVIDER_SITE_OTHER): Payer: Medicare Other | Admitting: Internal Medicine

## 2012-11-01 VITALS — BP 124/80 | HR 79 | Temp 98.3°F | Ht 68.5 in | Wt 280.0 lb

## 2012-11-01 DIAGNOSIS — Z1231 Encounter for screening mammogram for malignant neoplasm of breast: Secondary | ICD-10-CM

## 2012-11-01 DIAGNOSIS — M899 Disorder of bone, unspecified: Secondary | ICD-10-CM

## 2012-11-01 DIAGNOSIS — Z0000 Encounter for general adult medical examination without abnormal findings: Secondary | ICD-10-CM

## 2012-11-01 DIAGNOSIS — Z13220 Encounter for screening for lipoid disorders: Secondary | ICD-10-CM

## 2012-11-01 DIAGNOSIS — M1990 Unspecified osteoarthritis, unspecified site: Secondary | ICD-10-CM

## 2012-11-01 DIAGNOSIS — K219 Gastro-esophageal reflux disease without esophagitis: Secondary | ICD-10-CM

## 2012-11-01 DIAGNOSIS — M949 Disorder of cartilage, unspecified: Secondary | ICD-10-CM

## 2012-11-01 LAB — CBC WITH DIFFERENTIAL/PLATELET
Basophils Absolute: 0 10*3/uL (ref 0.0–0.1)
Eosinophils Absolute: 0.2 10*3/uL (ref 0.0–0.7)
HCT: 48.9 % — ABNORMAL HIGH (ref 36.0–46.0)
Hemoglobin: 16.3 g/dL — ABNORMAL HIGH (ref 12.0–15.0)
Lymphs Abs: 1.9 10*3/uL (ref 0.7–4.0)
MCHC: 33.3 g/dL (ref 30.0–36.0)
MCV: 88.9 fl (ref 78.0–100.0)
Monocytes Absolute: 0.4 10*3/uL (ref 0.1–1.0)
Monocytes Relative: 6.1 % (ref 3.0–12.0)
Neutro Abs: 3.6 10*3/uL (ref 1.4–7.7)
Platelets: 148 10*3/uL — ABNORMAL LOW (ref 150.0–400.0)
RDW: 13 % (ref 11.5–14.6)

## 2012-11-01 MED ORDER — AMBULATORY NON FORMULARY MEDICATION: Status: DC

## 2012-11-01 MED ORDER — CYCLOBENZAPRINE HCL 10 MG PO TABS: 10.0000 mg | ORAL_TABLET | Freq: Two times a day (BID) | ORAL | Status: DC | PRN

## 2012-11-01 MED ORDER — ZOSTER VACCINE LIVE 19400 UNT/0.65ML ~~LOC~~ SOLR: 0.6500 mL | Freq: Once | SUBCUTANEOUS | Status: AC

## 2012-11-01 MED ORDER — HYDROCODONE-IBUPROFEN 7.5-200 MG PO TABS: ORAL_TABLET | ORAL | Status: DC

## 2012-11-01 NOTE — Assessment & Plan Note (Signed)
Asymptomatic, on no medications

## 2012-11-01 NOTE — Assessment & Plan Note (Signed)
Symptoms well controlled with Vicoprofen, refill as needed come back in 6 months

## 2012-11-01 NOTE — Assessment & Plan Note (Addendum)
Plan to do another bone density test ~ 05-2013. Continue calcium and vitamin D.
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
## 2012-11-01 NOTE — Assessment & Plan Note (Addendum)
Td-- 2009  zostavax  rx provided last year, did not get the shot, new rx provided  Las saw Dr. Edward Jolly 06-2010, reports a MMG 2012, did not see them in 2013: referral done  never colonoscopy benefits discussed (see previous entry); states she turned in the IFOB last year  , I don't see a report. Plan: IFOB provided today   Diet discuss, encouraged to be as active as possible Labs reviewed:

## 2012-11-01 NOTE — Patient Instructions (Signed)

## 2012-11-02 LAB — LIPID PANEL
Cholesterol: 201 mg/dL — ABNORMAL HIGH (ref 0–200)
HDL: 65.7 mg/dL (ref >39.00)
Total CHOL/HDL Ratio: 3
VLDL: 20.8 mg/dL (ref 0.0–40.0)

## 2012-11-02 LAB — COMPREHENSIVE METABOLIC PANEL
ALT: 23 U/L (ref 0–35)
Alkaline Phosphatase: 76 U/L (ref 39–117)
CO2: 23 mEq/L (ref 19–32)
Creatinine, Ser: 0.6 mg/dL (ref 0.4–1.2)
GFR: 103.87 mL/min (ref >60.00)
Sodium: 146 mEq/L — ABNORMAL HIGH (ref 135–145)
Total Bilirubin: 1.1 mg/dL (ref 0.3–1.2)
Total Protein: 7.4 g/dL (ref 6.0–8.3)

## 2012-11-02 LAB — LDL CHOLESTEROL, DIRECT: Direct LDL: 136.3 mg/dL

## 2012-11-07 ENCOUNTER — Encounter: Payer: Self-pay | Admitting: *Deleted

## 2012-11-07 ENCOUNTER — Telehealth: Payer: Self-pay | Admitting: *Deleted

## 2012-11-07 DIAGNOSIS — E875 Hyperkalemia: Secondary | ICD-10-CM

## 2012-11-07 NOTE — Telephone Encounter (Signed)
Letter mailed, lab orders entered.

## 2012-11-07 NOTE — Telephone Encounter (Signed)
Message copied by Nada Maclachlan on Mon Nov 07, 2012  9:22 AM ------      Message from: [name]      Created: Sun Nov 06, 2012 11:28 AM      Regarding: BMP, low K diet       Mail the letter with a low potassium diet.      Arrange for a BMP to be done in one month, DX hyperkalemia ------

## 2012-11-11 ENCOUNTER — Other Ambulatory Visit (INDEPENDENT_AMBULATORY_CARE_PROVIDER_SITE_OTHER): Payer: Medicare Other

## 2012-11-11 ENCOUNTER — Other Ambulatory Visit: Payer: Self-pay | Admitting: *Deleted

## 2012-11-11 DIAGNOSIS — Z1211 Encounter for screening for malignant neoplasm of colon: Secondary | ICD-10-CM

## 2012-11-11 LAB — FECAL OCCULT BLOOD, IMMUNOCHEMICAL: Fecal Occult Bld: NEGATIVE

## 2012-11-15 ENCOUNTER — Encounter: Payer: Self-pay | Admitting: Internal Medicine

## 2013-01-06 ENCOUNTER — Ambulatory Visit (INDEPENDENT_AMBULATORY_CARE_PROVIDER_SITE_OTHER): Payer: Medicare Other | Admitting: Internal Medicine

## 2013-01-06 ENCOUNTER — Encounter: Payer: Self-pay | Admitting: Internal Medicine

## 2013-01-06 VITALS — BP 120/70 | HR 78 | Temp 98.2°F | Wt 283.4 lb

## 2013-01-06 DIAGNOSIS — K5732 Diverticulitis of large intestine without perforation or abscess without bleeding: Secondary | ICD-10-CM

## 2013-01-06 DIAGNOSIS — H5702 Anisocoria: Secondary | ICD-10-CM

## 2013-01-06 NOTE — Patient Instructions (Addendum)
Please  talk to your eye doctor about your pupils, let me know if I need to send you to a neurologist Come back in December for a regular check up
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
## 2013-01-06 NOTE — Assessment & Plan Note (Addendum)
Outpatient treatment for first episode of diverticulitis documented by CT 11/22/2012. She took Levaquin and Flagyl , currently asymptomatic.  Already following a high fiber diet.  Never had a colonoscopy, offered a referral but she is not ready for that yet. We agreed we'll discuss that in December when she comes back. Recommend to call me if symptoms resurface.

## 2013-01-08 ENCOUNTER — Encounter: Payer: Self-pay | Admitting: Internal Medicine

## 2013-01-15 ENCOUNTER — Other Ambulatory Visit: Payer: Self-pay | Admitting: Internal Medicine

## 2013-01-18 NOTE — Telephone Encounter (Signed)
Refill request for Hydrocodone-ibuprofen Last filled by MD on - 11/01/12 #90 x0 Last Seen- 01/06/13 Next Appt: none Please advise refill?

## 2013-01-20 NOTE — Telephone Encounter (Signed)
Done  Low risk UDS 10-2012

## 2013-02-17 ENCOUNTER — Encounter: Payer: Self-pay | Admitting: Internal Medicine

## 2013-02-20 ENCOUNTER — Telehealth: Payer: Self-pay | Admitting: *Deleted

## 2013-02-20 NOTE — Telephone Encounter (Signed)
Too early, has enough RFs  until 04-2013

## 2013-02-20 NOTE — Telephone Encounter (Signed)
Pt is calling to refill hydrocodone-ibuprofen and flexeril. Patient was last seen on 11/01/2012. Flexeril was last filled on 11/01/2012 and hydrocodone-ibuprofen was last filled on 01/15/2013. UDS on 10/31/2012-low risk, contract signed. Please advise.

## 2013-02-21 ENCOUNTER — Other Ambulatory Visit: Payer: Self-pay | Admitting: *Deleted

## 2013-02-21 DIAGNOSIS — G894 Chronic pain syndrome: Secondary | ICD-10-CM

## 2013-02-21 MED ORDER — HYDROCODONE-IBUPROFEN 7.5-200 MG PO TABS: ORAL_TABLET | ORAL | Status: DC

## 2013-02-21 NOTE — Telephone Encounter (Signed)
Rx printed and placed on ledge for MD signature.

## 2013-02-21 NOTE — Telephone Encounter (Signed)
Patient's pharmacy is calling about Hydrocodone rx they received via fax. They cannot accept via fax. Patient will need to bring in paper copy.

## 2013-02-21 NOTE — Telephone Encounter (Signed)
Karen Prime, MA that pt needs to pick up script.

## 2013-02-21 NOTE — Telephone Encounter (Signed)
Pt states that she takes at least 2-3 tabs a day depending on her pain. She is not sure how many she has left. She states that [facility] says that she does have refills left but because of the new law she would need a whole new prescription. She is okay on the flexeril.  Please advise. SW

## 2013-02-21 NOTE — Telephone Encounter (Signed)
She got hydrocodone #90 and 4 refills 01/15/2013. Please call the pharmacy  and see what's going on.

## 2013-02-24 ENCOUNTER — Telehealth: Payer: Self-pay | Admitting: *Deleted

## 2013-02-24 NOTE — Telephone Encounter (Signed)
Patient called and was informed that her prescription for hydrocodone is ready for pick up at our front desk. She also requested a medical release of information form so she can get her notes from her obgyn sent to [place].

## 2013-03-23 ENCOUNTER — Other Ambulatory Visit: Payer: Self-pay

## 2013-03-27 ENCOUNTER — Telehealth: Payer: Self-pay | Admitting: *Deleted

## 2013-03-27 DIAGNOSIS — G894 Chronic pain syndrome: Secondary | ICD-10-CM

## 2013-03-27 MED ORDER — HYDROCODONE-IBUPROFEN 7.5-200 MG PO TABS: ORAL_TABLET | ORAL | Status: DC

## 2013-03-27 NOTE — Telephone Encounter (Signed)
Done

## 2013-03-27 NOTE — Telephone Encounter (Signed)
Patient is calling for refill request for hydrocodone-ibuprofen.  Last seen-01/06/2013  Last filled-02/21/2013  UDS-10/31/2012-low risk, contract signed  Please advise. SW

## 2013-03-29 ENCOUNTER — Ambulatory Visit (INDEPENDENT_AMBULATORY_CARE_PROVIDER_SITE_OTHER): Payer: Medicare Other | Admitting: Internal Medicine

## 2013-03-29 ENCOUNTER — Encounter: Payer: Self-pay | Admitting: Internal Medicine

## 2013-03-29 VITALS — BP 130/85 | HR 84 | Temp 98.4°F | Wt 283.0 lb

## 2013-03-29 DIAGNOSIS — M1990 Unspecified osteoarthritis, unspecified site: Secondary | ICD-10-CM

## 2013-03-29 MED ORDER — PREDNISONE 10 MG PO TABS: ORAL_TABLET | ORAL | Status: DC

## 2013-03-29 NOTE — Patient Instructions (Signed)
Ice twice a day Prednisone as prescribed Call if no better  See your orthopedic MD
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
## 2013-03-29 NOTE — Assessment & Plan Note (Signed)
Current  hip pain likely throcanteric bursitis, recommend ice, prednisone and continue hydrocodone. Has an appointment pending to see orthopedic surgery.

## 2013-03-29 NOTE — Progress Notes (Signed)
Pre visit review using our clinic review tool, if applicable. No additional management support is needed unless otherwise documented below in the visit note. 

## 2013-04-24 ENCOUNTER — Telehealth: Payer: Self-pay | Admitting: *Deleted

## 2013-04-24 DIAGNOSIS — G894 Chronic pain syndrome: Secondary | ICD-10-CM

## 2013-04-24 MED ORDER — HYDROCODONE-IBUPROFEN 7.5-200 MG PO TABS: ORAL_TABLET | ORAL | Status: DC

## 2013-04-24 NOTE — Telephone Encounter (Signed)
HYDROcodone-ibuprofen (VICOPROFEN) 7.5-200 MG per tablet Last refill: 03/27/13, #90 Last OV: 03/29/13 UDS up-to-date, low risk

## 2013-04-24 NOTE — Telephone Encounter (Signed)
Tell pt: i did 2 prescriptions, "to be fill in December 2014" and "to be filled  in January 2015" thus has two month supply

## 2013-04-25 NOTE — Telephone Encounter (Signed)
Called and spoke with patient informing her that her prescriptions for Hydrocodone are ready for pick up at our front desk. She stated she would probably be by Friday to pick them up.

## 2013-05-19 ENCOUNTER — Encounter: Payer: Self-pay | Admitting: Internal Medicine

## 2013-06-26 ENCOUNTER — Telehealth: Payer: Self-pay | Admitting: *Deleted

## 2013-06-26 ENCOUNTER — Encounter: Payer: Self-pay | Admitting: Internal Medicine

## 2013-06-26 ENCOUNTER — Ambulatory Visit (INDEPENDENT_AMBULATORY_CARE_PROVIDER_SITE_OTHER): Payer: Medicare Other | Admitting: Internal Medicine

## 2013-06-26 VITALS — BP 106/68 | HR 84 | Temp 99.0°F | Wt 271.0 lb

## 2013-06-26 DIAGNOSIS — K5732 Diverticulitis of large intestine without perforation or abscess without bleeding: Secondary | ICD-10-CM

## 2013-06-26 DIAGNOSIS — G894 Chronic pain syndrome: Secondary | ICD-10-CM

## 2013-06-26 DIAGNOSIS — R1030 Lower abdominal pain, unspecified: Secondary | ICD-10-CM

## 2013-06-26 DIAGNOSIS — R109 Unspecified abdominal pain: Secondary | ICD-10-CM

## 2013-06-26 LAB — POCT URINALYSIS DIPSTICK
BILIRUBIN UA: NEGATIVE
GLUCOSE UA: NEGATIVE
Nitrite, UA: NEGATIVE
Protein, UA: NEGATIVE
SPEC GRAV UA: 1.01
Urobilinogen, UA: 0.2
pH, UA: 6

## 2013-06-26 LAB — URINALYSIS, ROUTINE W REFLEX MICROSCOPIC
BILIRUBIN URINE: NEGATIVE
Nitrite: NEGATIVE
Specific Gravity, Urine: 1.01 (ref 1.000–1.030)
Total Protein, Urine: NEGATIVE
UROBILINOGEN UA: 0.2 (ref 0.0–1.0)
Urine Glucose: NEGATIVE
pH: 6 (ref 5.0–8.0)

## 2013-06-26 MED ORDER — AMOXICILLIN-POT CLAVULANATE 875-125 MG PO TABS: 1.0000 | ORAL_TABLET | Freq: Two times a day (BID) | ORAL | Status: DC

## 2013-06-26 MED ORDER — HYDROCODONE-IBUPROFEN 7.5-200 MG PO TABS
ORAL_TABLET | ORAL | Status: DC
Start: 2013-06-26 — End: 2013-08-15

## 2013-06-26 NOTE — Assessment & Plan Note (Signed)
Left lower quadrant abdominal pain, Symptoms are suggestive of diverticulitis, vital signs are stable, she does not look toxic. Urinalyses + blood ddx-- diverticulitis vs UTI Plan: UCX augmentin Rest RTC if sx increase

## 2013-06-26 NOTE — Telephone Encounter (Signed)
Patient wanted to request a refill for HYDROcodone-ibuprofen (VICOPROFEN) 7.5-200 MG per tablet

## 2013-06-26 NOTE — Telephone Encounter (Signed)
Hydrocodone-ibuprofen 7.5-200 prn q6h Last OV: 06/26/13 Last refill: 04/24/13 #90, 0 refill UDS due, low risk
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
## 2013-06-26 NOTE — Progress Notes (Signed)
Pre visit review using our clinic review tool, if applicable. No additional management support is needed unless otherwise documented below in the visit note. 

## 2013-06-26 NOTE — Telephone Encounter (Signed)
Done

## 2013-06-26 NOTE — Patient Instructions (Signed)
Take antibiotics as prescribed MiraLax 17 g daily with fluids to prevent constipation Call anytime if you have fever, chills, nausea, increased abdominal pain or if you're not improving in the next few days.

## 2013-06-27 LAB — URINE CULTURE
COLONY COUNT: NO GROWTH
ORGANISM ID, BACTERIA: NO GROWTH

## 2013-08-15 ENCOUNTER — Telehealth: Payer: Self-pay | Admitting: Internal Medicine

## 2013-08-15 DIAGNOSIS — G894 Chronic pain syndrome: Secondary | ICD-10-CM

## 2013-08-15 MED ORDER — HYDROCODONE-IBUPROFEN 7.5-200 MG PO TABS: ORAL_TABLET | ORAL | Status: DC

## 2013-08-15 NOTE — Telephone Encounter (Signed)
Due for UDS [facility] RF

## 2013-08-15 NOTE — Telephone Encounter (Signed)
Pt notified. rx rdy for pick up at front desk aswell as UDS.

## 2013-08-15 NOTE — Telephone Encounter (Signed)
Hydrocodone-ibuprofen 7.5-200 prn q6h  Last OV: 06/26/13  Last refill: 06/26/2013, #90, 0 refill  UDS 10/2012, low risk

## 2013-08-15 NOTE — Telephone Encounter (Signed)
08/15/13  Pt is requesting a refill on rx HYDROcodone-ibuprofen (VICOPROFEN) 7.5-200 MG

## 2013-09-08 ENCOUNTER — Telehealth: Payer: Self-pay

## 2013-09-08 NOTE — Telephone Encounter (Signed)
UDS: 08/21/2013 Positive for hydrocodone Per Dr Larose Kells low risk

## 2013-09-19 ENCOUNTER — Telehealth: Payer: Self-pay | Admitting: *Deleted

## 2013-09-19 DIAGNOSIS — G894 Chronic pain syndrome: Secondary | ICD-10-CM

## 2013-09-19 MED ORDER — HYDROCODONE-IBUPROFEN 7.5-200 MG PO TABS: ORAL_TABLET | ORAL | Status: DC

## 2013-09-19 NOTE — Telephone Encounter (Signed)
HYDROcodone-ibuprofen (VICOPROFEN) 7.5-200 MG per tablet  Last filled 08/15/2013, #90, no refills  Last OV 06/26/2013 UDS- 08/21/13 LOW risk

## 2013-09-19 NOTE — Telephone Encounter (Signed)
done

## 2013-09-19 NOTE — Telephone Encounter (Signed)
Caller name: Suetta Relation to pt: self Call back number: [phone] Pharmacy: [facility], [place]  Reason for call:  Pt request refill on the following:  HYDROcodone-ibuprofen (VICOPROFEN) 7.5-200 MG per tablet  Last filled 08/15/2013, #90, no refills Last OV 06/26/2013

## 2013-09-19 NOTE — Telephone Encounter (Signed)
lmovm rx rdy for pick up at our front desk.  

## 2013-10-02 ENCOUNTER — Encounter: Payer: Self-pay | Admitting: Internal Medicine

## 2013-10-23 ENCOUNTER — Telehealth: Payer: Self-pay | Admitting: Internal Medicine

## 2013-10-23 ENCOUNTER — Encounter: Payer: Self-pay | Admitting: Internal Medicine

## 2013-10-23 ENCOUNTER — Ambulatory Visit (INDEPENDENT_AMBULATORY_CARE_PROVIDER_SITE_OTHER): Payer: Medicare Other | Admitting: Internal Medicine

## 2013-10-23 VITALS — BP 112/79 | HR 76 | Temp 97.8°F | Wt 274.0 lb

## 2013-10-23 DIAGNOSIS — R002 Palpitations: Secondary | ICD-10-CM

## 2013-10-23 NOTE — Telephone Encounter (Signed)
Call returned to patient at [phone] to triage patient for complaints of "heart palpitations. No answer voicemail message left for patient to return call to office number. Attempt to reach patient at alternate number, [phone], unsuccessful-no answer.

## 2013-10-23 NOTE — Telephone Encounter (Signed)
Tried calling the patient as well. No answer on either numbers listed. Left messages for call back on both numbers.

## 2013-10-23 NOTE — Telephone Encounter (Signed)
C/o:  Heart palpitations described as "a flutter" or "extra beat" since the beginning of May.  Palpitations would appear intermittently and have become more pronounced over the weekend.  She notices them more during rest.  When she is up and walking she does not notice them as much.  She denies chest pain, shortness of breath, nausea/vomiting, dizziness/feeling faint, weakness and fatigue.  States that she only drinks water and drinks approximately 6 - 8 glasses of water per day.  She did however express that she has not been sleeping well lately.  She averages about 4 hours of sleep a night and as result she feels exhausted.  She has also been working in the yard for the past 2 weeks, but states that she works slowly.   Advice given: Appointment with Dr. Larose Kells today at 4:15 pm and to report to the ER or call EMS if her symptoms worsen before her appointment.  Pt stated understanding and agreed.

## 2013-10-23 NOTE — Progress Notes (Signed)
Pre visit review using our clinic review tool, if applicable. No additional management support is needed unless otherwise documented below in the visit note. 

## 2013-10-23 NOTE — Patient Instructions (Signed)
Will schedule a Echocardiogram and a holter monitor  If you have severe symptoms: go to the ER or call us   Next visit in 6 weeks
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
## 2013-10-24 DIAGNOSIS — R002 Palpitations: Secondary | ICD-10-CM | POA: Insufficient documentation

## 2013-10-24 LAB — CBC WITH DIFFERENTIAL/PLATELET
BASOS ABS: 0 10*3/uL (ref 0.0–0.1)
Basophils Relative: 0.4 % (ref 0.0–3.0)
EOS ABS: 0.1 10*3/uL (ref 0.0–0.7)
Eosinophils Relative: 1.8 % (ref 0.0–5.0)
HCT: 43.1 % (ref 36.0–46.0)
Hemoglobin: 14.4 g/dL (ref 12.0–15.0)
LYMPHS PCT: 29.5 % (ref 12.0–46.0)
Lymphs Abs: 2.4 10*3/uL (ref 0.7–4.0)
MCHC: 33.3 g/dL (ref 30.0–36.0)
MCV: 89.1 fl (ref 78.0–100.0)
Monocytes Absolute: 0.6 10*3/uL (ref 0.1–1.0)
Monocytes Relative: 6.8 % (ref 3.0–12.0)
Neutro Abs: 5 10*3/uL (ref 1.4–7.7)
Neutrophils Relative %: 61.5 % (ref 43.0–77.0)
Platelets: 198 10*3/uL (ref 150.0–400.0)
RBC: 4.84 Mil/uL (ref 3.87–5.11)
RDW: 13.7 % (ref 11.5–15.5)
WBC: 8.2 10*3/uL (ref 4.0–10.5)

## 2013-10-24 LAB — BASIC METABOLIC PANEL
BUN: 19 mg/dL (ref 6–23)
CALCIUM: 9.4 mg/dL (ref 8.4–10.5)
CO2: 28 mEq/L (ref 19–32)
CREATININE: 0.6 mg/dL (ref 0.4–1.2)
Chloride: 102 mEq/L (ref 96–112)
GFR: 103.54 mL/min (ref >60.00)
GLUCOSE: 88 mg/dL (ref 70–99)
Potassium: 3.9 mEq/L (ref 3.5–5.1)
Sodium: 138 mEq/L (ref 135–145)

## 2013-10-24 LAB — TSH: TSH: 1.25 u[IU]/mL (ref 0.35–4.50)

## 2013-10-24 NOTE — Assessment & Plan Note (Signed)
Palpitations, systolic murmur. One-month history of palpitations but no red flag symptoms. No clear etiology. She also has a very mild systolic murmur that i don't recall hearing before. Plan: BMP, CBC, TSH Echocardiogram Holter monitor Call or go to the ER if symptoms increase.

## 2013-11-03 ENCOUNTER — Telehealth: Payer: Self-pay | Admitting: Internal Medicine

## 2013-11-03 DIAGNOSIS — G894 Chronic pain syndrome: Secondary | ICD-10-CM

## 2013-11-03 NOTE — Telephone Encounter (Signed)
Caller name: Christasia  Call back number:[phone]   Reason for call:  Pt wants a  Refill on RX HYDROcodone-ibuprofen (VICOPROFEN) 7.5-200 MG per tablet

## 2013-11-06 MED ORDER — HYDROCODONE-IBUPROFEN 7.5-200 MG PO TABS: ORAL_TABLET | ORAL | Status: DC

## 2013-11-06 NOTE — Telephone Encounter (Signed)
Done. Pt notified of rx pick up at front desk.

## 2013-11-06 NOTE — Telephone Encounter (Signed)
Done x 2 RFs

## 2013-11-06 NOTE — Telephone Encounter (Signed)
Patient is requesting a refill on Vicoprofen. Last office visit 10/23/13 Last filled 09/19/13 #90  UDS 08/21/13 low risk Okay to refill?

## 2013-11-08 ENCOUNTER — Ambulatory Visit (HOSPITAL_BASED_OUTPATIENT_CLINIC_OR_DEPARTMENT_OTHER)
Admission: RE | Admit: 2013-11-08 | Discharge: 2013-11-08 | Disposition: A | Discharge status: Home / Self Care | Payer: Medicare Other | Source: Ambulatory Visit | Attending: Internal Medicine | Admitting: Internal Medicine

## 2013-11-08 DIAGNOSIS — I359 Nonrheumatic aortic valve disorder, unspecified: Secondary | ICD-10-CM | POA: Insufficient documentation

## 2013-11-08 DIAGNOSIS — R002 Palpitations: Secondary | ICD-10-CM

## 2013-11-08 NOTE — Progress Notes (Signed)
Echocardiogram 2D Echocardiogram has been performed.  Joelene Millin 11/08/2013, 10:13 AM

## 2013-11-27 ENCOUNTER — Encounter: Payer: Self-pay | Admitting: Internal Medicine

## 2013-12-05 ENCOUNTER — Ambulatory Visit (INDEPENDENT_AMBULATORY_CARE_PROVIDER_SITE_OTHER): Payer: Medicare Other | Admitting: Internal Medicine

## 2013-12-05 ENCOUNTER — Encounter: Payer: Self-pay | Admitting: Internal Medicine

## 2013-12-05 VITALS — BP 130/84 | HR 86 | Temp 97.8°F | Wt 270.0 lb

## 2013-12-05 DIAGNOSIS — R002 Palpitations: Secondary | ICD-10-CM

## 2013-12-05 NOTE — Assessment & Plan Note (Signed)
Palpitations greatly decreased,  She does have a soft systolic murmur. Since her last visit, echocardiogram was normal, labs were normal. The patient declined a Holter monitor since she is doing better. Plan: Observation
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
## 2013-12-05 NOTE — Progress Notes (Signed)
Pre visit review using our clinic review tool, if applicable. No additional management support is needed unless otherwise documented below in the visit note. 

## 2013-12-05 NOTE — Patient Instructions (Signed)
  Next visit is for a physical exam at your convenience in the next 3 months, fasting Please make an appointment

## 2014-01-11 ENCOUNTER — Other Ambulatory Visit: Payer: Self-pay | Admitting: Internal Medicine

## 2014-01-12 ENCOUNTER — Telehealth: Payer: Self-pay

## 2014-01-12 DIAGNOSIS — G894 Chronic pain syndrome: Secondary | ICD-10-CM

## 2014-01-12 NOTE — Telephone Encounter (Signed)
LMOM for Pt that medication will be ready for pick up on Monday.

## 2014-01-12 NOTE — Telephone Encounter (Signed)
Pt is requesting Hydrocodone-Ibuprofen refill.   Last OV: 12/05/2013 Last Fill: 11/03/2013 Last UDS: 08/21/2013  Pt would like to see if she can get x2 RFs  For medication.   Please Advise.

## 2014-01-12 NOTE — Telephone Encounter (Signed)
Advise pt: will RF on Monday, just send the phone note back to me (we won't have printers till then)

## 2014-01-15 MED ORDER — HYDROCODONE-IBUPROFEN 7.5-200 MG PO TABS: ORAL_TABLET | ORAL | Status: DC

## 2014-01-15 NOTE — Telephone Encounter (Signed)
LMOM for Pt that medication is ready for pick up at front desk.

## 2014-03-15 ENCOUNTER — Other Ambulatory Visit: Payer: Self-pay

## 2014-03-23 ENCOUNTER — Encounter: Payer: Medicare Other | Admitting: Internal Medicine

## 2014-05-02 ENCOUNTER — Telehealth: Payer: Self-pay | Admitting: Internal Medicine

## 2014-05-02 DIAGNOSIS — G894 Chronic pain syndrome: Secondary | ICD-10-CM

## 2014-05-02 MED ORDER — HYDROCODONE-IBUPROFEN 7.5-200 MG PO TABS: ORAL_TABLET | ORAL | Status: DC

## 2014-05-02 NOTE — Telephone Encounter (Signed)
Error/gd °

## 2014-05-02 NOTE — Telephone Encounter (Signed)
done

## 2014-05-02 NOTE — Addendum Note (Signed)
Addended by: Kathlene November E on: 05/02/2014 05:11 PM   Modules accepted: Orders

## 2014-05-02 NOTE — Telephone Encounter (Signed)
Caller name:Hoban, Remo Lipps Relation to ER:DEYC Call back [phone] Pharmacy:  Reason for call: pt is needing rx HYDROcodone-ibuprofen (VICOPROFEN) 7.5-200 MG per, please call when available for pick up.

## 2014-05-02 NOTE — Telephone Encounter (Signed)
Pt is requesting refill on Hydrocodone.  Last OV: 12/05/2013, appt scheduled for 06/12/2014 at 3 Last Fill: 01/15/2014 # 13 0RF UDS: 08/21/2013 Low risk  Please advise.

## 2014-05-03 NOTE — Telephone Encounter (Signed)
Spoke with Pt, informed her that rx is ready for pick up at front desk. Pt informed she will be by office tomorrow to pick up rx.

## 2014-05-07 ENCOUNTER — Encounter: Payer: Self-pay | Admitting: Internal Medicine

## 2014-05-08 ENCOUNTER — Other Ambulatory Visit: Payer: Self-pay

## 2014-05-08 MED ORDER — AMBULATORY NON FORMULARY MEDICATION: Status: DC

## 2014-06-12 ENCOUNTER — Encounter: Payer: Medicare Other | Admitting: Internal Medicine

## 2014-06-29 ENCOUNTER — Ambulatory Visit (INDEPENDENT_AMBULATORY_CARE_PROVIDER_SITE_OTHER): Payer: Medicare Other | Admitting: Internal Medicine

## 2014-06-29 ENCOUNTER — Other Ambulatory Visit: Payer: Self-pay

## 2014-06-29 ENCOUNTER — Encounter: Payer: Self-pay | Admitting: Internal Medicine

## 2014-06-29 ENCOUNTER — Telehealth: Payer: Self-pay | Admitting: Internal Medicine

## 2014-06-29 VITALS — BP 143/93 | HR 80 | Temp 98.7°F | Ht 69.0 in | Wt 282.2 lb

## 2014-06-29 DIAGNOSIS — M150 Primary generalized (osteo)arthritis: Secondary | ICD-10-CM

## 2014-06-29 DIAGNOSIS — M791 Myalgia, unspecified site: Secondary | ICD-10-CM

## 2014-06-29 DIAGNOSIS — M159 Polyosteoarthritis, unspecified: Secondary | ICD-10-CM

## 2014-06-29 DIAGNOSIS — G894 Chronic pain syndrome: Secondary | ICD-10-CM

## 2014-06-29 DIAGNOSIS — M1990 Unspecified osteoarthritis, unspecified site: Secondary | ICD-10-CM

## 2014-06-29 MED ORDER — HYDROCODONE-IBUPROFEN 7.5-200 MG PO TABS: ORAL_TABLET | ORAL | Status: DC

## 2014-06-29 NOTE — Telephone Encounter (Signed)
Caller name: Brandin Relation to pt: self Call back number: [phone] Pharmacy:  Reason for call:   Patient is requesting an rx for a lift chair. So she can use this on her taxes. Please fax to [phone] attn chris

## 2014-06-29 NOTE — Progress Notes (Signed)
Pre visit review using our clinic review tool, if applicable. No additional management support is needed unless otherwise documented below in the visit note. 

## 2014-06-29 NOTE — Patient Instructions (Signed)
  Please come back to the office  In 3 or 4 months  for a physical exam. Come back fasting

## 2014-06-29 NOTE — Telephone Encounter (Signed)
Please advise 
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
## 2014-06-30 NOTE — Assessment & Plan Note (Signed)
Pain management, Patient seems to be doing well with hydrocodone  Last UDS/2015 low risk. Plan: Prescription provided, UDS on return to the office

## 2014-07-01 NOTE — Telephone Encounter (Signed)
Send a prescription: Lift chair DX-- DJD, fibromyalgia

## 2014-07-02 NOTE — Telephone Encounter (Signed)
Rx printed awaiting Dr. Larose Kells signature.

## 2014-07-02 NOTE — Telephone Encounter (Signed)
Faxed to ATTN: Gerald Stabs to number given: [phone].

## 2014-07-09 DIAGNOSIS — M19019 Primary osteoarthritis, unspecified shoulder: Secondary | ICD-10-CM | POA: Insufficient documentation

## 2014-07-09 DIAGNOSIS — M7540 Impingement syndrome of unspecified shoulder: Secondary | ICD-10-CM | POA: Insufficient documentation

## 2014-07-09 DIAGNOSIS — M67919 Unspecified disorder of synovium and tendon, unspecified shoulder: Secondary | ICD-10-CM | POA: Insufficient documentation

## 2014-07-18 ENCOUNTER — Telehealth: Payer: Self-pay | Admitting: Internal Medicine

## 2014-07-18 NOTE — Telephone Encounter (Signed)
Caller name: Gyneth, Hubka Relation to pt: self  Call back number: [phone]   Reason for call:  Pt requesting clinical advice regarding  pain management and would like to inform MD of instructions on how to fill out pain med form.  Please advise

## 2014-07-18 NOTE — Telephone Encounter (Signed)
Please advise 

## 2014-07-18 NOTE — Telephone Encounter (Signed)
Please call the patient and clarify what her needs are

## 2014-07-19 NOTE — Telephone Encounter (Signed)
Prior authorization for HYDROcodone-ibuprofen (VICOPROFEN) 7.5-200 MG per tablet submitted to [facility]. Awaiting determination.

## 2014-07-20 ENCOUNTER — Telehealth: Payer: Self-pay | Admitting: Internal Medicine

## 2014-07-20 NOTE — Telephone Encounter (Signed)
Caller name: tonya from [facility]  Relation to pt: Call back number: [phone] Pharmacy:  Reason for call:   Hydrocodone has been approved for a year. eff till 07/19/15

## 2014-07-20 NOTE — Telephone Encounter (Signed)
[name] at 07/20/2014 3:13 PM     Status: Signed       Expand All Collapse All   Caller name: tonya from [facility]  Relation to pt: Call back number: [phone] Pharmacy:  Reason for call:   Hydrocodone has been approved for a year. eff till 07/19/15

## 2014-07-20 NOTE — Telephone Encounter (Signed)
Noted. [name] informed.

## 2014-07-31 ENCOUNTER — Encounter: Payer: Self-pay | Admitting: Internal Medicine

## 2014-09-25 ENCOUNTER — Telehealth: Payer: Self-pay | Admitting: Internal Medicine

## 2014-09-25 DIAGNOSIS — G894 Chronic pain syndrome: Secondary | ICD-10-CM

## 2014-09-25 MED ORDER — HYDROCODONE-IBUPROFEN 7.5-200 MG PO TABS: 1.0000 | ORAL_TABLET | Freq: Four times a day (QID) | ORAL | Status: DC | PRN

## 2014-09-25 NOTE — Telephone Encounter (Signed)
Pt is requesting refill on Hydrocodone-Ibuprofen.  Last OV: 06/29/2014 Last Fill: 06/29/2014 # [id number] UDS: 08/21/2013 Low risk  Please advise.

## 2014-09-25 NOTE — Telephone Encounter (Signed)
Caller name: Shacoya Relation to pt: self Call back number: [phone] Pharmacy:  Reason for call:   Requesting refill of hydrocodone and ibuprofen

## 2014-09-25 NOTE — Telephone Encounter (Signed)
Okay #90. 

## 2014-09-25 NOTE — Telephone Encounter (Signed)
Rx printed, awaiting MD signature.  

## 2014-09-26 NOTE — Telephone Encounter (Signed)
LMOM informing Pt that Rx is ready for pick up at front desk.

## 2014-10-01 ENCOUNTER — Telehealth: Payer: Self-pay | Admitting: *Deleted

## 2014-10-01 NOTE — Telephone Encounter (Signed)
Disability placard application dropped off by pt. Form filled out as much as possible and forwarded to Dr. Larose Kells. JG//CMA

## 2014-10-03 NOTE — Telephone Encounter (Signed)
Completed form placed up front for pt to pick up. Called and informed pt. Copy sent for scanning. JG//CMA

## 2014-10-11 ENCOUNTER — Telehealth: Payer: Self-pay

## 2014-10-11 NOTE — Telephone Encounter (Signed)
UDS: 10/01/2014  Positive for Hydrocodone   Low risk per Dr. Larose Kells 10/11/2014

## 2014-10-25 ENCOUNTER — Telehealth: Payer: Self-pay | Admitting: Internal Medicine

## 2014-10-25 NOTE — Telephone Encounter (Signed)
Pre visit letter sent  °

## 2014-11-13 ENCOUNTER — Telehealth: Payer: Self-pay | Admitting: *Deleted

## 2014-11-13 NOTE — Telephone Encounter (Signed)
Unable to reach patient at time of Pre-Visit Call.  Left message for patient to return call when available.    

## 2014-11-14 ENCOUNTER — Ambulatory Visit (INDEPENDENT_AMBULATORY_CARE_PROVIDER_SITE_OTHER): Payer: Medicare Other | Admitting: Internal Medicine

## 2014-11-14 ENCOUNTER — Encounter: Payer: Self-pay | Admitting: Internal Medicine

## 2014-11-14 VITALS — BP 126/74 | HR 66 | Temp 97.9°F | Ht 69.0 in | Wt 289.5 lb

## 2014-11-14 DIAGNOSIS — Z0000 Encounter for general adult medical examination without abnormal findings: Secondary | ICD-10-CM | POA: Diagnosis not present

## 2014-11-14 DIAGNOSIS — M159 Polyosteoarthritis, unspecified: Secondary | ICD-10-CM

## 2014-11-14 DIAGNOSIS — M797 Fibromyalgia: Secondary | ICD-10-CM | POA: Diagnosis not present

## 2014-11-14 DIAGNOSIS — M150 Primary generalized (osteo)arthritis: Secondary | ICD-10-CM

## 2014-11-14 DIAGNOSIS — G894 Chronic pain syndrome: Secondary | ICD-10-CM

## 2014-11-14 DIAGNOSIS — M8580 Other specified disorders of bone density and structure, unspecified site: Secondary | ICD-10-CM

## 2014-11-14 LAB — BASIC METABOLIC PANEL
BUN: 15 mg/dL (ref 6–23)
CO2: 29 mEq/L (ref 19–32)
Calcium: 9.6 mg/dL (ref 8.4–10.5)
Chloride: 103 mEq/L (ref 96–112)
Creatinine, Ser: 0.59 mg/dL (ref 0.40–1.20)
GFR: 109.26 mL/min (ref >60.00)
GLUCOSE: 89 mg/dL (ref 70–99)
Potassium: 4.5 mEq/L (ref 3.5–5.1)
Sodium: 139 mEq/L (ref 135–145)

## 2014-11-14 LAB — LIPID PANEL
Cholesterol: 193 mg/dL (ref 0–200)
HDL: 59 mg/dL (ref >39.00)
LDL Cholesterol: 112 mg/dL — ABNORMAL HIGH (ref 0–99)
NonHDL: 134
TRIGLYCERIDES: 108 mg/dL (ref 0.0–149.0)
Total CHOL/HDL Ratio: 3
VLDL: 21.6 mg/dL (ref 0.0–40.0)

## 2014-11-14 MED ORDER — HYDROCODONE-IBUPROFEN 7.5-200 MG PO TABS: 1.0000 | ORAL_TABLET | Freq: Four times a day (QID) | ORAL | Status: DC | PRN

## 2014-11-14 MED ORDER — CYCLOBENZAPRINE HCL 10 MG PO TABS: 10.0000 mg | ORAL_TABLET | Freq: Two times a day (BID) | ORAL | Status: DC | PRN

## 2014-11-14 NOTE — Assessment & Plan Note (Addendum)
Td-- 2009  Declined a pneumonia shot zostavax - had rx x 2, decided not to get  Last gyn visit 2014 , reports Dr Rogue Bussing did a PAP and it was ok  MMG 12-2012 ned @ [place]: never colonoscopy benefits discussed , discussed 2 other alternatives, elected a cologuard Diet discuss, encouraged to be as active as possible  Labs: BMP FLP
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
## 2014-11-14 NOTE — Assessment & Plan Note (Signed)
rx density test

## 2014-11-14 NOTE — Assessment & Plan Note (Signed)
We'll need a prescription for a tens unit and Rolfing

## 2014-11-14 NOTE — Patient Instructions (Signed)
Get your blood work before you leave    Sharon cause injuries and can affect all age groups. It is possible to use preventive measures to significantly decrease the likelihood of falls. There are many simple measures which can make your home safer and prevent falls. OUTDOORS  Repair cracks and edges of walkways and driveways.  Remove high doorway thresholds.  Trim shrubbery on the main path into your home.  Have good outside lighting.  Clear walkways of tools, rocks, debris, and clutter.  Check that handrails are not broken and are securely fastened. Both sides of steps should have handrails.  Have leaves, snow, and ice cleared regularly.  Use sand or salt on walkways during winter months.  In the garage, clean up grease or oil spills. BATHROOM  Install night lights.  Install grab bars by the toilet and in the tub and shower.  Use non-skid mats or decals in the tub or shower.  Place a plastic non-slip stool in the shower to sit on, if needed.  Keep floors dry and clean up all water on the floor immediately.  Remove soap buildup in the tub or shower on a regular basis.  Secure bath mats with non-slip, double-sided rug tape.  Remove throw rugs and tripping hazards from the floors. BEDROOMS  Install night lights.  Make sure a bedside light is easy to reach.  Do not use oversized bedding.  Keep a telephone by your bedside.  Have a firm chair with side arms to use for getting dressed.  Remove throw rugs and tripping hazards from the floor. KITCHEN  Keep handles on pots and pans turned toward the center of the stove. Use back burners when possible.  Clean up spills quickly and allow time for drying.  Avoid walking on wet floors.  Avoid hot utensils and knives.  Position shelves so they are not too high or low.  Place commonly used objects within easy reach.  If necessary, use a sturdy step stool with a grab bar when  reaching.  Keep electrical cables out of the way.  Do not use floor polish or wax that makes floors slippery. If you must use wax, use non-skid floor wax.  Remove throw rugs and tripping hazards from the floor. STAIRWAYS  Never leave objects on stairs.  Place handrails on both sides of stairways and use them. Fix any loose handrails. Make sure handrails on both sides of the stairways are as long as the stairs.  Check carpeting to make sure it is firmly attached along stairs. Make repairs to worn or loose carpet promptly.  Avoid placing throw rugs at the top or bottom of stairways, or properly secure the rug with carpet tape to prevent slippage. Get rid of throw rugs, if possible.  Have an electrician put in a light switch at the top and bottom of the stairs. OTHER FALL PREVENTION TIPS  Wear low-heel or rubber-soled shoes that are supportive and fit well. Wear closed toe shoes.  When using a stepladder, make sure it is fully opened and both spreaders are firmly locked. Do not climb a closed stepladder.  Add color or contrast paint or tape to grab bars and handrails in your home. Place contrasting color strips on first and last steps.  Learn and use mobility aids as needed. Install an electrical emergency response system.  Turn on lights to avoid dark areas. Replace light bulbs that burn out immediately. Get light switches that glow.  Arrange furniture  to create clear pathways. Keep furniture in the same place.  Firmly attach carpet with non-skid or double-sided tape.  Eliminate uneven floor surfaces.  Select a carpet pattern that does not visually hide the edge of steps.  Be aware of all pets. OTHER HOME SAFETY TIPS  Set the water temperature for 120 F (48.8 C).  Keep emergency numbers on or near the telephone.  Keep smoke detectors on every level of the home and near sleeping areas. Document Released: 04/24/2002 Document Revised: 11/03/2011 Document Reviewed:  07/24/2011 [facility] Patient Information 2015 [place], [place]. This information is not intended to replace advice given to you by your health care provider. Make sure you discuss any questions you have with your health care provider.   Preventive Care for Adults Ages 49 and over  Blood pressure check.** / Every 1 to 2 years.  Lipid and cholesterol check.**/ Every 5 years beginning at age 10.  Lung cancer screening. / Every year if you are aged 4-80 years and have a 30-pack-year history of smoking and currently smoke or have quit within the past 15 years. Yearly screening is stopped once you have quit smoking for at least 15 years or develop a health problem that would prevent you from having lung cancer treatment.  Fecal occult blood test (FOBT) of stool. / Every year beginning at age 39 and continuing until age 22. You may not have to do this test if you get a colonoscopy every 10 years.  Flexible sigmoidoscopy** or colonoscopy.** / Every 5 years for a flexible sigmoidoscopy or every 10 years for a colonoscopy beginning at age 54 and continuing until age 44.  Hepatitis C blood test.** / For all people born from [birth year] through 1964 and any individual with known risks for hepatitis C.  Abdominal aortic aneurysm (AAA) screening.** / A one-time screening for ages 39 to 93 years who are current or former smokers.  Skin self-exam. / Monthly.  Influenza vaccine. / Every year.  Tetanus, diphtheria, and acellular pertussis (Tdap/Td) vaccine.** / 1 dose of Td every 10 years.  Varicella vaccine.** / Consult your health care provider.  Zoster vaccine.** / 1 dose for adults aged 31 years or older.  Pneumococcal 13-valent conjugate (PCV13) vaccine.** / Consult your health care provider.  Pneumococcal polysaccharide (PPSV23) vaccine.** / 1 dose for all adults aged 69 years and older.  Meningococcal vaccine.** / Consult your health care provider.  Hepatitis A vaccine.** / Consult your health care  provider.  Hepatitis B vaccine.** / Consult your health care provider.  Haemophilus influenzae type b (Hib) vaccine.** / Consult your health care provider. **Family history and personal history of risk and conditions may change your health care provider's recommendations. Document Released: 06/30/2001 Document Revised: 05/09/2013 Document Reviewed: 09/29/2010 [facility] Patient Information 2015 [place], [place]. This information is not intended to replace advice given to you by your health care provider. Make sure you discuss any questions you have with your health care provider.

## 2014-11-14 NOTE — Assessment & Plan Note (Signed)
UDS 09/2014 low risk

## 2014-11-14 NOTE — Progress Notes (Signed)
Pre visit review using our clinic review tool, if applicable. No additional management support is needed unless otherwise documented below in the visit note. 

## 2014-11-15 ENCOUNTER — Telehealth: Payer: Self-pay

## 2014-11-15 NOTE — Telephone Encounter (Signed)
Cologuard order requisition completed and faxed to [facility]/Colog[facility]ard along with Pt's face sheet and insurance card.

## 2014-11-15 NOTE — Telephone Encounter (Signed)
Received fax confirmation on 11/15/2014 at 1:21PM. Order requisition sent for scanning into Pt's chart.

## 2014-12-05 ENCOUNTER — Other Ambulatory Visit (HOSPITAL_BASED_OUTPATIENT_CLINIC_OR_DEPARTMENT_OTHER): Payer: Medicare Other

## 2014-12-24 ENCOUNTER — Ambulatory Visit (HOSPITAL_BASED_OUTPATIENT_CLINIC_OR_DEPARTMENT_OTHER)
Admission: RE | Admit: 2014-12-24 | Discharge: 2014-12-24 | Disposition: A | Discharge status: Home / Self Care | Payer: Medicare Other | Source: Ambulatory Visit | Attending: Internal Medicine | Admitting: Internal Medicine

## 2014-12-24 ENCOUNTER — Other Ambulatory Visit: Payer: Self-pay | Admitting: Internal Medicine

## 2014-12-24 DIAGNOSIS — Z1231 Encounter for screening mammogram for malignant neoplasm of breast: Secondary | ICD-10-CM | POA: Diagnosis not present

## 2014-12-24 DIAGNOSIS — M8580 Other specified disorders of bone density and structure, unspecified site: Secondary | ICD-10-CM | POA: Diagnosis not present

## 2015-01-01 ENCOUNTER — Telehealth: Payer: Self-pay | Admitting: Internal Medicine

## 2015-01-01 DIAGNOSIS — G894 Chronic pain syndrome: Secondary | ICD-10-CM

## 2015-01-01 NOTE — Telephone Encounter (Signed)
Pt is requesting refill on Hydrocodone.  Last OV: 11/14/2014 Last Fill: 11/14/2014 #[id number] 0RF UDS: 10/01/2014 Low risk  Please advise.

## 2015-01-01 NOTE — Telephone Encounter (Signed)
Pt called for refill on hydrocodone. Has 8 left. Taking 2-3/day. Please call when RX is ready. Ph# [phone].

## 2015-01-02 MED ORDER — HYDROCODONE-IBUPROFEN 7.5-200 MG PO TABS: 1.0000 | ORAL_TABLET | Freq: Four times a day (QID) | ORAL | Status: DC | PRN

## 2015-01-02 NOTE — Telephone Encounter (Signed)
Please inform Pt that Rx has been placed at front desk for pick up at her convenience. Thank you.  

## 2015-01-02 NOTE — Telephone Encounter (Signed)
Rx's for August and September 2016 printed, awaiting MD signature.

## 2015-01-02 NOTE — Telephone Encounter (Signed)
Ok  #90, 2 prescriptions 

## 2015-01-02 NOTE — Telephone Encounter (Signed)
Notified pt RX is ready for pick up

## 2015-01-22 ENCOUNTER — Ambulatory Visit: Payer: Medicare Other | Admitting: Internal Medicine

## 2015-01-22 ENCOUNTER — Telehealth: Payer: Self-pay | Admitting: Internal Medicine

## 2015-01-23 ENCOUNTER — Ambulatory Visit (INDEPENDENT_AMBULATORY_CARE_PROVIDER_SITE_OTHER): Payer: Medicare Other | Admitting: Internal Medicine

## 2015-01-23 ENCOUNTER — Encounter: Payer: Self-pay | Admitting: Internal Medicine

## 2015-01-23 VITALS — BP 126/74 | HR 67 | Temp 98.4°F | Ht 69.0 in | Wt 289.5 lb

## 2015-01-23 DIAGNOSIS — R1032 Left lower quadrant pain: Secondary | ICD-10-CM

## 2015-01-23 DIAGNOSIS — Z09 Encounter for follow-up examination after completed treatment for conditions other than malignant neoplasm: Secondary | ICD-10-CM

## 2015-01-23 DIAGNOSIS — K5732 Diverticulitis of large intestine without perforation or abscess without bleeding: Secondary | ICD-10-CM

## 2015-01-23 MED ORDER — AMOXICILLIN-POT CLAVULANATE 875-125 MG PO TABS: 1.0000 | ORAL_TABLET | Freq: Two times a day (BID) | ORAL | Status: DC

## 2015-01-23 NOTE — Progress Notes (Signed)
Pre visit review using our clinic review tool, if applicable. No additional management support is needed unless otherwise documented below in the visit note. 
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
## 2015-01-23 NOTE — Patient Instructions (Signed)
Go to the lab him provide urine sample  Take Augmentin 1 tablet twice a day for 10 days  Rest, drink plenty of fluids  Once better, restarted your fiber supplements  Call anytime if you have severe symptoms, if not improving in the next 5 days or  if the symptoms come back after you finish the antibiotics.

## 2015-01-24 DIAGNOSIS — Z09 Encounter for follow-up examination after completed treatment for conditions other than malignant neoplasm: Secondary | ICD-10-CM | POA: Insufficient documentation

## 2015-01-24 LAB — URINALYSIS, ROUTINE W REFLEX MICROSCOPIC
BILIRUBIN URINE: NEGATIVE
Hgb urine dipstick: NEGATIVE
Ketones, ur: NEGATIVE
LEUKOCYTES UA: NEGATIVE
Nitrite: NEGATIVE
PH: 8 (ref 5.0–8.0)
Specific Gravity, Urine: 1.01 (ref 1.000–1.030)
TOTAL PROTEIN, URINE-UPE24: NEGATIVE
URINE GLUCOSE: NEGATIVE
UROBILINOGEN UA: 0.2 (ref 0.0–1.0)

## 2015-01-24 LAB — URINE CULTURE
Colony Count: NO GROWTH
Organism ID, Bacteria: NO GROWTH

## 2015-01-24 NOTE — Assessment & Plan Note (Signed)
Diverticulitis: Symptoms most likely due to diverticulitis, we discussed workup versus empiric treatment, we agreed on empiric treatment with Augmentin and check a UA, urine culture. See instructions.

## 2015-01-28 ENCOUNTER — Encounter: Payer: Self-pay | Admitting: Internal Medicine

## 2015-02-08 NOTE — Telephone Encounter (Signed)
Pt marked as no show but actually was late for appt 01/22/15 1:00pm, pt came in at 1:17pm and rescheduled for 01/23/15, charge for no show?

## 2015-02-08 NOTE — Telephone Encounter (Signed)
No, thx 

## 2015-03-06 ENCOUNTER — Other Ambulatory Visit: Payer: Self-pay | Admitting: Internal Medicine

## 2015-03-06 DIAGNOSIS — G894 Chronic pain syndrome: Secondary | ICD-10-CM

## 2015-03-07 MED ORDER — HYDROCODONE-IBUPROFEN 7.5-200 MG PO TABS: 1.0000 | ORAL_TABLET | Freq: Four times a day (QID) | ORAL | Status: DC | PRN

## 2015-03-07 NOTE — Telephone Encounter (Signed)
Ok 90, 2 Rxs

## 2015-03-07 NOTE — Telephone Encounter (Signed)
Rx's placed at front desk for pick-up, Pt informed via [place].

## 2015-03-07 NOTE — Telephone Encounter (Signed)
Rx's for October and November 2016 printed, awaiting MD signature.

## 2015-03-07 NOTE — Telephone Encounter (Signed)
Pt is requesting refill on Hydrocodone. Pt is requesting 2 prescriptions if possible.  Last OV: 01/23/2015 Last Fill: 01/02/2015 #90 tabs and 0 RF For September 2016 UDS: 10/01/2014 Low risk  Please advise.

## 2015-04-15 LAB — COLOGUARD: Cologuard: NEGATIVE

## 2015-05-06 ENCOUNTER — Ambulatory Visit: Payer: Medicare Other | Admitting: Internal Medicine

## 2015-05-17 ENCOUNTER — Ambulatory Visit: Payer: Medicare Other | Admitting: Internal Medicine

## 2015-05-22 ENCOUNTER — Encounter: Payer: Self-pay | Admitting: Internal Medicine

## 2015-05-22 ENCOUNTER — Ambulatory Visit (INDEPENDENT_AMBULATORY_CARE_PROVIDER_SITE_OTHER): Payer: Medicare Other | Admitting: Internal Medicine

## 2015-05-22 VITALS — BP 126/74 | HR 76 | Temp 97.8°F | Ht 69.0 in | Wt 276.0 lb

## 2015-05-22 DIAGNOSIS — K5732 Diverticulitis of large intestine without perforation or abscess without bleeding: Secondary | ICD-10-CM

## 2015-05-22 DIAGNOSIS — M797 Fibromyalgia: Secondary | ICD-10-CM

## 2015-05-22 DIAGNOSIS — J069 Acute upper respiratory infection, unspecified: Secondary | ICD-10-CM

## 2015-05-22 MED ORDER — HYDROCODONE-IBUPROFEN 5-200 MG PO TABS: 1.0000 | ORAL_TABLET | Freq: Three times a day (TID) | ORAL | Status: DC | PRN

## 2015-05-22 MED ORDER — AZELASTINE HCL 0.1 % NA SOLN: 2.0000 | Freq: Every evening | NASAL | Status: DC | PRN

## 2015-05-22 MED ORDER — HYDROCODONE-IBUPROFEN 2.5-200 MG PO TABS: 1.0000 | ORAL_TABLET | Freq: Three times a day (TID) | ORAL | Status: DC | PRN

## 2015-05-22 NOTE — Patient Instructions (Signed)
Rest, fluids , tylenol  For cough:  Take Mucinex DM twice a day as needed until better  For nasal congestion: Use OTC Nasocort or Flonase : 2 nasal sprays on each side of the nose in the morning until you feel better Use ASTELIN a prescribed spray : 2 nasal sprays on each side of the nose at night until you feel better   Avoid decongestants such as  Pseudoephedrine or phenylephrine     Call if not gradually better over the next 4-5 days  Call anytime if the symptoms are severe   Next visit: Complete physical exam by June 2017. Please make an appointment.

## 2015-05-22 NOTE — Progress Notes (Signed)
Pre visit review using our clinic review tool, if applicable. No additional management support is needed unless otherwise documented below in the visit note. 
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
## 2015-06-08 ENCOUNTER — Other Ambulatory Visit: Payer: Self-pay | Admitting: Internal Medicine

## 2015-06-10 MED ORDER — AMBULATORY NON FORMULARY MEDICATION: Status: DC

## 2015-09-03 ENCOUNTER — Ambulatory Visit (INDEPENDENT_AMBULATORY_CARE_PROVIDER_SITE_OTHER): Payer: Medicare Other | Admitting: Internal Medicine

## 2015-09-03 ENCOUNTER — Encounter: Payer: Self-pay | Admitting: Internal Medicine

## 2015-09-03 VITALS — BP 126/72 | HR 74 | Temp 97.6°F | Ht 69.0 in | Wt 283.4 lb

## 2015-09-03 DIAGNOSIS — M159 Polyosteoarthritis, unspecified: Secondary | ICD-10-CM

## 2015-09-03 DIAGNOSIS — M797 Fibromyalgia: Secondary | ICD-10-CM

## 2015-09-03 DIAGNOSIS — M150 Primary generalized (osteo)arthritis: Secondary | ICD-10-CM | POA: Diagnosis not present

## 2015-09-03 MED ORDER — TETANUS-DIPHTH-ACELL PERTUSSIS 5-2.5-18.5 LF-MCG/0.5 IM SUSP: 0.5000 mL | Freq: Once | INTRAMUSCULAR | Status: DC

## 2015-09-03 MED ORDER — HYDROCODONE-ACETAMINOPHEN 5-325 MG PO TABS: 1.0000 | ORAL_TABLET | Freq: Three times a day (TID) | ORAL | Status: DC | PRN

## 2015-09-03 NOTE — Progress Notes (Signed)
Pre visit review using our clinic review tool, if applicable. No additional management support is needed unless otherwise documented below in the visit note. 

## 2015-09-03 NOTE — Patient Instructions (Signed)
Take ibuprofen OTC 200 mg: One tablet 3 times a day as needed. Always take it with food because may cause gastritis and ulcers.  If you notice nausea, stomach pain, change in the color of stools --->  Stop the medicine and let us know  If you continue with pain, okay to take hydrocodone/acetaminophen as needed
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
## 2015-09-04 DIAGNOSIS — M1990 Unspecified osteoarthritis, unspecified site: Secondary | ICD-10-CM | POA: Insufficient documentation

## 2015-09-04 NOTE — Assessment & Plan Note (Signed)
DJD: Needs to switch  hydrocodone/ibuprofen to something else due to cost. Recommend ibuprofen OTC 3 times a day as needed, hydrocodone/acetaminophen as needed only. GI side effects discussed Fibromyalgia: Continue to be well-controlled Primary care: Requesting immunization for pertussis, after several phone calls we realized that will be less expensive for her to get it at the pharmacy, prescription provided RTC 11-2015 as scheduled

## 2015-09-20 ENCOUNTER — Encounter: Payer: Self-pay | Admitting: Internal Medicine

## 2015-09-20 ENCOUNTER — Ambulatory Visit (INDEPENDENT_AMBULATORY_CARE_PROVIDER_SITE_OTHER): Payer: Medicare Other | Admitting: Internal Medicine

## 2015-09-20 VITALS — BP 106/68 | HR 81 | Temp 98.0°F | Ht 69.0 in | Wt 274.8 lb

## 2015-09-20 DIAGNOSIS — R002 Palpitations: Secondary | ICD-10-CM

## 2015-09-20 DIAGNOSIS — Z09 Encounter for follow-up examination after completed treatment for conditions other than malignant neoplasm: Secondary | ICD-10-CM

## 2015-09-20 LAB — TSH: TSH: 1.43 u[IU]/mL (ref 0.35–4.50)

## 2015-09-20 LAB — CBC WITH DIFFERENTIAL/PLATELET
BASOS ABS: 0 10*3/uL (ref 0.0–0.1)
Basophils Relative: 0.2 % (ref 0.0–3.0)
Eosinophils Absolute: 0 10*3/uL (ref 0.0–0.7)
Eosinophils Relative: 0.4 % (ref 0.0–5.0)
HCT: 48.5 % — ABNORMAL HIGH (ref 36.0–46.0)
Hemoglobin: 16.4 g/dL — ABNORMAL HIGH (ref 12.0–15.0)
Lymphocytes Relative: 12.6 % (ref 12.0–46.0)
Lymphs Abs: 1.3 10*3/uL (ref 0.7–4.0)
MCHC: 33.8 g/dL (ref 30.0–36.0)
MCV: 87.2 fl (ref 78.0–100.0)
MONO ABS: 0.5 10*3/uL (ref 0.1–1.0)
MONOS PCT: 4.8 % (ref 3.0–12.0)
NEUTROS PCT: 82 % — AB (ref 43.0–77.0)
Neutro Abs: 8.7 10*3/uL — ABNORMAL HIGH (ref 1.4–7.7)
Platelets: 201 10*3/uL (ref 150.0–400.0)
RBC: 5.56 Mil/uL — AB (ref 3.87–5.11)
RDW: 13.4 % (ref 11.5–15.5)
WBC: 10.6 10*3/uL — AB (ref 4.0–10.5)

## 2015-09-20 LAB — BASIC METABOLIC PANEL
BUN: 15 mg/dL (ref 6–23)
CHLORIDE: 103 meq/L (ref 96–112)
CO2: 27 mEq/L (ref 19–32)
Calcium: 10 mg/dL (ref 8.4–10.5)
Creatinine, Ser: 0.66 mg/dL (ref 0.40–1.20)
GFR: 95.74 mL/min (ref >60.00)
Glucose, Bld: 99 mg/dL (ref 70–99)
POTASSIUM: 4.3 meq/L (ref 3.5–5.1)
Sodium: 139 mEq/L (ref 135–145)

## 2015-09-20 NOTE — Patient Instructions (Signed)
Will refer you to cardiology  Call or go to the ER if severe symptoms or you have chest pain, feeling fainty, difficulty breathing.

## 2015-09-20 NOTE — Assessment & Plan Note (Signed)
Palpitations: Similar sx 2015, echo showed first-degree diastolic dysfunction, patient declined a Holter. Asx since then. EKG today sinus rythm, no acute changes. Check a BMP, CBC and TSH Refer to cardiology. ER if severe sx or  associated with chest pain, difficulty breathing. RTC 11-2015 as scheduled

## 2015-09-20 NOTE — Progress Notes (Signed)
Pre visit review using our clinic review tool, if applicable. No additional management support is needed unless otherwise documented below in the visit note. 
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
## 2015-10-07 ENCOUNTER — Encounter: Payer: Self-pay | Admitting: Cardiology

## 2015-10-09 ENCOUNTER — Encounter: Payer: Self-pay | Admitting: Cardiology

## 2015-10-09 ENCOUNTER — Ambulatory Visit: Payer: Medicare Other | Attending: Family Medicine | Admitting: Physical Therapy

## 2015-10-09 ENCOUNTER — Ambulatory Visit (INDEPENDENT_AMBULATORY_CARE_PROVIDER_SITE_OTHER): Payer: Medicare Other | Admitting: Cardiology

## 2015-10-09 VITALS — BP 130/78 | HR 73 | Ht 69.0 in | Wt 273.4 lb

## 2015-10-09 DIAGNOSIS — M25671 Stiffness of right ankle, not elsewhere classified: Secondary | ICD-10-CM | POA: Diagnosis present

## 2015-10-09 DIAGNOSIS — M25571 Pain in right ankle and joints of right foot: Secondary | ICD-10-CM | POA: Diagnosis present

## 2015-10-09 DIAGNOSIS — R262 Difficulty in walking, not elsewhere classified: Secondary | ICD-10-CM | POA: Diagnosis present

## 2015-10-09 DIAGNOSIS — R002 Palpitations: Secondary | ICD-10-CM

## 2015-10-09 DIAGNOSIS — R0989 Other specified symptoms and signs involving the circulatory and respiratory systems: Secondary | ICD-10-CM

## 2015-10-09 DIAGNOSIS — R011 Cardiac murmur, unspecified: Secondary | ICD-10-CM | POA: Diagnosis not present
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
## 2015-10-09 NOTE — Patient Instructions (Signed)
Medication Instructions:   NO CHANGE  Testing/Procedures:  Your physician has requested that you have a carotid duplex. This test is an ultrasound of the carotid arteries in your neck. It looks at blood flow through these arteries that supply the brain with blood. Allow one hour for this exam. There are no restrictions or special instructions.    Follow-Up:  Your physician wants you to follow-up in: ONE YEAR WITH DR CRENSHAW You will receive a reminder letter in the mail two months in advance. If you don't receive a letter, please call our office to schedule the follow-up appointment.   If you need a refill on your cardiac medications before your next appointment, please call your pharmacy.    

## 2015-10-09 NOTE — Assessment & Plan Note (Signed)
We discussed need for exercise.She is limited by ankle pain but will consider water aerobics.

## 2015-10-09 NOTE — Assessment & Plan Note (Signed)
Patient has an aortic sclerosis murmur.Echocardiogram 2 years ago showed calcified aortic valve with mild aortic insufficiency. We will likely repeat her echocardiogram when she returns in 1 year.

## 2015-10-09 NOTE — Assessment & Plan Note (Signed)
Patient had an episode of palpitations of uncertain etiology.Her electrocardiogram recently showed sinus rhythm. I explained that in the future if she has another episode she should be seen for an electrocardiogram rhythm strip. We discussed a monitor but given the infrequency of symptoms I will not pursue this at present. We will consider in the future if she has more frequent episodes. Further recommendations if significant arrhythmia documented.

## 2015-10-09 NOTE — Assessment & Plan Note (Signed)
Schedule carotid Dopplers. 
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
## 2015-10-10 ENCOUNTER — Ambulatory Visit (HOSPITAL_BASED_OUTPATIENT_CLINIC_OR_DEPARTMENT_OTHER): Payer: Medicare Other

## 2015-10-16 ENCOUNTER — Ambulatory Visit (HOSPITAL_BASED_OUTPATIENT_CLINIC_OR_DEPARTMENT_OTHER)
Admission: RE | Admit: 2015-10-16 | Discharge: 2015-10-16 | Disposition: A | Discharge status: Home / Self Care | Payer: Medicare Other | Source: Ambulatory Visit | Attending: Cardiology | Admitting: Cardiology

## 2015-10-16 ENCOUNTER — Ambulatory Visit: Payer: Medicare Other

## 2015-10-16 DIAGNOSIS — R262 Difficulty in walking, not elsewhere classified: Secondary | ICD-10-CM

## 2015-10-16 DIAGNOSIS — M25671 Stiffness of right ankle, not elsewhere classified: Secondary | ICD-10-CM | POA: Diagnosis not present

## 2015-10-16 DIAGNOSIS — M25571 Pain in right ankle and joints of right foot: Secondary | ICD-10-CM

## 2015-10-16 DIAGNOSIS — R0989 Other specified symptoms and signs involving the circulatory and respiratory systems: Secondary | ICD-10-CM | POA: Diagnosis not present
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
## 2015-10-22 ENCOUNTER — Ambulatory Visit: Payer: Medicare Other | Attending: Family Medicine | Admitting: Physical Therapy

## 2015-10-22 DIAGNOSIS — R262 Difficulty in walking, not elsewhere classified: Secondary | ICD-10-CM

## 2015-10-22 DIAGNOSIS — M25671 Stiffness of right ankle, not elsewhere classified: Secondary | ICD-10-CM

## 2015-10-22 DIAGNOSIS — M25571 Pain in right ankle and joints of right foot: Secondary | ICD-10-CM
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
## 2015-10-24 ENCOUNTER — Ambulatory Visit: Payer: Medicare Other

## 2015-10-25 ENCOUNTER — Ambulatory Visit: Payer: Medicare Other

## 2015-10-25 DIAGNOSIS — R262 Difficulty in walking, not elsewhere classified: Secondary | ICD-10-CM

## 2015-10-25 DIAGNOSIS — M25571 Pain in right ankle and joints of right foot: Secondary | ICD-10-CM

## 2015-10-25 DIAGNOSIS — M25671 Stiffness of right ankle, not elsewhere classified: Secondary | ICD-10-CM | POA: Diagnosis not present
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
## 2015-10-29 ENCOUNTER — Ambulatory Visit: Payer: Medicare Other | Admitting: Physical Therapy

## 2015-10-29 DIAGNOSIS — R262 Difficulty in walking, not elsewhere classified: Secondary | ICD-10-CM

## 2015-10-29 DIAGNOSIS — M25671 Stiffness of right ankle, not elsewhere classified: Secondary | ICD-10-CM | POA: Diagnosis not present

## 2015-10-29 DIAGNOSIS — M25571 Pain in right ankle and joints of right foot: Secondary | ICD-10-CM
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
## 2015-10-30 ENCOUNTER — Other Ambulatory Visit: Payer: Self-pay | Admitting: Internal Medicine

## 2015-10-31 ENCOUNTER — Ambulatory Visit: Payer: Medicare Other

## 2015-10-31 DIAGNOSIS — M25671 Stiffness of right ankle, not elsewhere classified: Secondary | ICD-10-CM | POA: Diagnosis not present

## 2015-10-31 DIAGNOSIS — R262 Difficulty in walking, not elsewhere classified: Secondary | ICD-10-CM

## 2015-10-31 DIAGNOSIS — M25571 Pain in right ankle and joints of right foot: Secondary | ICD-10-CM

## 2015-10-31 MED ORDER — HYDROCODONE-ACETAMINOPHEN 5-325 MG PO TABS: 1.0000 | ORAL_TABLET | Freq: Three times a day (TID) | ORAL | Status: DC | PRN

## 2015-10-31 NOTE — Telephone Encounter (Signed)
Rx's for June and July 2017 printed. Pt informed via MyChart that Rx's have been placed at front desk for pick up.

## 2015-10-31 NOTE — Telephone Encounter (Signed)
Pt is requesting refill on Hydrocodone. Pt requesting 2 prescriptions if possible.  Last OV: 09/20/2015 Last Fill: 09/03/2015 #90 and 0RF UDS: 10/01/2014 Low risk  Please advise.

## 2015-10-31 NOTE — Addendum Note (Signed)
Addended byDamita Dunnings D on: 10/31/2015 09:49 AM   Modules accepted: Orders

## 2015-10-31 NOTE — Telephone Encounter (Signed)
Okay two prescriptions 
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
## 2015-11-05 ENCOUNTER — Ambulatory Visit: Payer: Medicare Other

## 2015-11-05 DIAGNOSIS — R262 Difficulty in walking, not elsewhere classified: Secondary | ICD-10-CM

## 2015-11-05 DIAGNOSIS — M25571 Pain in right ankle and joints of right foot: Secondary | ICD-10-CM

## 2015-11-05 DIAGNOSIS — M25671 Stiffness of right ankle, not elsewhere classified: Secondary | ICD-10-CM
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
## 2015-11-07 ENCOUNTER — Ambulatory Visit: Payer: Medicare Other | Admitting: Physical Therapy

## 2015-11-07 DIAGNOSIS — M25571 Pain in right ankle and joints of right foot: Secondary | ICD-10-CM

## 2015-11-07 DIAGNOSIS — M25671 Stiffness of right ankle, not elsewhere classified: Secondary | ICD-10-CM

## 2015-11-07 DIAGNOSIS — R262 Difficulty in walking, not elsewhere classified: Secondary | ICD-10-CM
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
## 2015-11-12 ENCOUNTER — Telehealth: Payer: Self-pay | Admitting: Internal Medicine

## 2015-11-12 ENCOUNTER — Ambulatory Visit: Payer: Medicare Other

## 2015-11-12 DIAGNOSIS — M25571 Pain in right ankle and joints of right foot: Secondary | ICD-10-CM

## 2015-11-12 DIAGNOSIS — M25671 Stiffness of right ankle, not elsewhere classified: Secondary | ICD-10-CM | POA: Diagnosis not present

## 2015-11-12 DIAGNOSIS — R262 Difficulty in walking, not elsewhere classified: Secondary | ICD-10-CM
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
## 2015-11-14 ENCOUNTER — Telehealth: Payer: Self-pay

## 2015-11-14 ENCOUNTER — Ambulatory Visit: Payer: Medicare Other

## 2015-11-14 DIAGNOSIS — M25671 Stiffness of right ankle, not elsewhere classified: Secondary | ICD-10-CM | POA: Diagnosis not present

## 2015-11-14 DIAGNOSIS — R262 Difficulty in walking, not elsewhere classified: Secondary | ICD-10-CM

## 2015-11-14 DIAGNOSIS — M25571 Pain in right ankle and joints of right foot: Secondary | ICD-10-CM

## 2015-11-14 NOTE — Telephone Encounter (Signed)
UDS: 10/31/2015  Positive for Hydrocodone   Low risk per Dr. Larose Kells 11/14/2015
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
## 2015-11-20 ENCOUNTER — Ambulatory Visit: Payer: Medicare Other

## 2015-11-25 ENCOUNTER — Encounter: Payer: Medicare Other | Admitting: Internal Medicine

## 2015-11-27 ENCOUNTER — Ambulatory Visit: Payer: Medicare Other | Attending: Family Medicine

## 2015-11-27 DIAGNOSIS — M25571 Pain in right ankle and joints of right foot: Secondary | ICD-10-CM | POA: Diagnosis present

## 2015-11-27 DIAGNOSIS — M25671 Stiffness of right ankle, not elsewhere classified: Secondary | ICD-10-CM | POA: Diagnosis present

## 2015-11-27 DIAGNOSIS — R262 Difficulty in walking, not elsewhere classified: Secondary | ICD-10-CM | POA: Diagnosis present
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
## 2015-12-03 NOTE — Telephone Encounter (Signed)
Completed.

## 2015-12-06 ENCOUNTER — Ambulatory Visit: Payer: Medicare Other

## 2015-12-06 DIAGNOSIS — M25671 Stiffness of right ankle, not elsewhere classified: Secondary | ICD-10-CM | POA: Diagnosis not present

## 2015-12-06 DIAGNOSIS — M25571 Pain in right ankle and joints of right foot: Secondary | ICD-10-CM

## 2015-12-06 DIAGNOSIS — R262 Difficulty in walking, not elsewhere classified: Secondary | ICD-10-CM
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
## 2015-12-12 ENCOUNTER — Ambulatory Visit: Payer: Medicare Other | Admitting: Physical Therapy

## 2015-12-12 DIAGNOSIS — M25671 Stiffness of right ankle, not elsewhere classified: Secondary | ICD-10-CM | POA: Diagnosis not present

## 2015-12-12 DIAGNOSIS — M25571 Pain in right ankle and joints of right foot: Secondary | ICD-10-CM

## 2015-12-12 DIAGNOSIS — R262 Difficulty in walking, not elsewhere classified: Secondary | ICD-10-CM
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
## 2015-12-16 ENCOUNTER — Ambulatory Visit (INDEPENDENT_AMBULATORY_CARE_PROVIDER_SITE_OTHER): Payer: Medicare Other | Admitting: Internal Medicine

## 2015-12-16 ENCOUNTER — Encounter: Payer: Self-pay | Admitting: Internal Medicine

## 2015-12-16 VITALS — BP 122/78 | HR 78 | Temp 98.7°F | Resp 14 | Ht 69.0 in | Wt 275.1 lb

## 2015-12-16 DIAGNOSIS — Z0000 Encounter for general adult medical examination without abnormal findings: Secondary | ICD-10-CM

## 2015-12-16 DIAGNOSIS — Z01419 Encounter for gynecological examination (general) (routine) without abnormal findings: Secondary | ICD-10-CM

## 2015-12-16 DIAGNOSIS — Z114 Encounter for screening for human immunodeficiency virus [HIV]: Secondary | ICD-10-CM

## 2015-12-16 DIAGNOSIS — R0989 Other specified symptoms and signs involving the circulatory and respiratory systems: Secondary | ICD-10-CM

## 2015-12-16 LAB — LIPID PANEL
CHOLESTEROL: 198 mg/dL (ref 0–200)
HDL: 65.6 mg/dL (ref >39.00)
LDL Cholesterol: 116 mg/dL — ABNORMAL HIGH (ref 0–99)
NONHDL: 131.96
Total CHOL/HDL Ratio: 3
Triglycerides: 81 mg/dL (ref 0.0–149.0)
VLDL: 16.2 mg/dL (ref 0.0–40.0)

## 2015-12-16 LAB — HIV ANTIBODY (ROUTINE TESTING W REFLEX): HIV: NONREACTIVE

## 2015-12-16 MED ORDER — CYCLOBENZAPRINE HCL 10 MG PO TABS: 10.0000 mg | ORAL_TABLET | Freq: Two times a day (BID) | ORAL | 1 refills | Status: DC | PRN

## 2015-12-16 MED ORDER — HYDROCODONE-ACETAMINOPHEN 5-325 MG PO TABS: 1.0000 | ORAL_TABLET | Freq: Three times a day (TID) | ORAL | 0 refills | Status: DC | PRN

## 2015-12-16 NOTE — Assessment & Plan Note (Signed)
Morbid obesity: Diet discussed. Mobility quite limited MSK: Refill hydrocodone and Flexeril Palpitations: Seen by cardiology 10/09/2015, no further testing recommended. Now nearly asx. Occasionally has "extra heartbeat" H/o vit D def, on OTCs, last level ok RTC  6 months

## 2015-12-16 NOTE — Assessment & Plan Note (Addendum)
Td-- 2009 ; zostavax - had rx x 2, decided not to get  Last gyn visit 2014 , reports Dr Rogue Bussing, referral sent  MMG 12-2014 neg   CCS:  cologuard (-) 03-2015, next 3 years  Diet discussed  Labs: FLP, HIV

## 2015-12-16 NOTE — Assessment & Plan Note (Deleted)
Morbid obesity: Diet discussed. Mobility quite limited MSK: Refill hydrocodone and Flexeril Palpitations: Seen by cardiology 10/09/2015, no further testing recommended. Now nearly asx. Occasionally has "extra heartbeat" H/o vit D def, on OTCs, last level ok RTC  6 months
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
## 2015-12-16 NOTE — Progress Notes (Signed)
Pre visit review using our clinic review tool, if applicable. No additional management support is needed unless otherwise documented below in the visit note. 

## 2015-12-16 NOTE — Patient Instructions (Signed)
Get your blood work before you leave   Next visit in 6 months.    Fall Prevention and Home Safety Falls cause injuries and can affect all age groups. It is possible to use preventive measures to significantly decrease the likelihood of falls. There are many simple measures which can make your home safer and prevent falls. OUTDOORS  Repair cracks and edges of walkways and driveways.  Remove high doorway thresholds.  Trim shrubbery on the main path into your home.  Have good outside lighting.  Clear walkways of tools, rocks, debris, and clutter.  Check that handrails are not broken and are securely fastened. Both sides of steps should have handrails.  Have leaves, snow, and ice cleared regularly.  Use sand or salt on walkways during winter months.  In the garage, clean up grease or oil spills. BATHROOM  Install night lights.  Install grab bars by the toilet and in the tub and shower.  Use non-skid mats or decals in the tub or shower.  Place a plastic non-slip stool in the shower to sit on, if needed.  Keep floors dry and clean up all water on the floor immediately.  Remove soap buildup in the tub or shower on a regular basis.  Secure bath mats with non-slip, double-sided rug tape.  Remove throw rugs and tripping hazards from the floors. BEDROOMS  Install night lights.  Make sure a bedside light is easy to reach.  Do not use oversized bedding.  Keep a telephone by your bedside.  Have a firm chair with side arms to use for getting dressed.  Remove throw rugs and tripping hazards from the floor. KITCHEN  Keep handles on pots and pans turned toward the center of the stove. Use back burners when possible.  Clean up spills quickly and allow time for drying.  Avoid walking on wet floors.  Avoid hot utensils and knives.  Position shelves so they are not too high or low.  Place commonly used objects within easy reach.  If necessary, use a sturdy step stool  with a grab bar when reaching.  Keep electrical cables out of the way.  Do not use floor polish or wax that makes floors slippery. If you must use wax, use non-skid floor wax.  Remove throw rugs and tripping hazards from the floor. STAIRWAYS  Never leave objects on stairs.  Place handrails on both sides of stairways and use them. Fix any loose handrails. Make sure handrails on both sides of the stairways are as long as the stairs.  Check carpeting to make sure it is firmly attached along stairs. Make repairs to worn or loose carpet promptly.  Avoid placing throw rugs at the top or bottom of stairways, or properly secure the rug with carpet tape to prevent slippage. Get rid of throw rugs, if possible.  Have an electrician put in a light switch at the top and bottom of the stairs. OTHER FALL PREVENTION TIPS  Wear low-heel or rubber-soled shoes that are supportive and fit well. Wear closed toe shoes.  When using a stepladder, make sure it is fully opened and both spreaders are firmly locked. Do not climb a closed stepladder.  Add color or contrast paint or tape to grab bars and handrails in your home. Place contrasting color strips on first and last steps.  Learn and use mobility aids as needed. Install an electrical emergency response system.  Turn on lights to avoid dark areas. Replace light bulbs that burn out immediately. Get  Get light switches that glow.  Arrange furniture to create clear pathways. Keep furniture in the same place.  Firmly attach carpet with non-skid or double-sided tape.  Eliminate uneven floor surfaces.  Select a carpet pattern that does not visually hide the edge of steps.  Be aware of all pets. OTHER HOME SAFETY TIPS  Set the water temperature for 120 F (48.8 C).  Keep emergency numbers on or near the telephone.  Keep smoke detectors on every level of the home and near sleeping areas. Document Released: 04/24/2002 Document Revised: 11/03/2011  Document Reviewed: 07/24/2011 ExitCare Patient Information 2015 [employer]. This information is not intended to replace advice given to you by your health care provider. Make sure you discuss any questions you have with your health care provider.   Preventive Care for Adults Ages 65 and over  Blood pressure check.** / Every 1 to 2 years.  Lipid and cholesterol check.**/ Every 5 years beginning at age 20.  Lung cancer screening. / Every year if you are aged 55-80 years and have a 30-pack-year history of smoking and currently smoke or have quit within the past 15 years. Yearly screening is stopped once you have quit smoking for at least 15 years or develop a health problem that would prevent you from having lung cancer treatment.  Fecal occult blood test (FOBT) of stool. / Every year beginning at age 50 and continuing until age 75. You may not have to do this test if you get a colonoscopy every 10 years.  Flexible sigmoidoscopy** or colonoscopy.** / Every 5 years for a flexible sigmoidoscopy or every 10 years for a colonoscopy beginning at age 50 and continuing until age 75.  Hepatitis C blood test.** / For all people born from 1945 through 1965 and any individual with known risks for hepatitis C.  Abdominal aortic aneurysm (AAA) screening.** / A one-time screening for ages 65 to 75 years who are current or former smokers.  Skin self-exam. / Monthly.  Influenza vaccine. / Every year.  Tetanus, diphtheria, and acellular pertussis (Tdap/Td) vaccine.** / 1 dose of Td every 10 years.  Varicella vaccine.** / Consult your health care provider.  Zoster vaccine.** / 1 dose for adults aged 60 years or older.  Pneumococcal 13-valent conjugate (PCV13) vaccine.** / Consult your health care provider.  Pneumococcal polysaccharide (PPSV23) vaccine.** / 1 dose for all adults aged 65 years and older.  Meningococcal vaccine.** / Consult your health care provider.  Hepatitis A vaccine.** /  Consult your health care provider.  Hepatitis B vaccine.** / Consult your health care provider.  Haemophilus influenzae type b (Hib) vaccine.** / Consult your health care provider. **Family history and personal history of risk and conditions may change your health care provider's recommendations. Document Released: 06/30/2001 Document Revised: 05/09/2013 Document Reviewed: 09/29/2010 ExitCare Patient Information 2015 [employer]. This information is not intended to replace advice given to you by your health care provider. Make sure you discuss any questions you have with your health care provider.   

## 2015-12-19 ENCOUNTER — Ambulatory Visit: Payer: Medicare Other | Attending: Family Medicine

## 2015-12-19 DIAGNOSIS — M25571 Pain in right ankle and joints of right foot: Secondary | ICD-10-CM | POA: Insufficient documentation

## 2015-12-19 DIAGNOSIS — R262 Difficulty in walking, not elsewhere classified: Secondary | ICD-10-CM | POA: Diagnosis present

## 2015-12-19 DIAGNOSIS — M25671 Stiffness of right ankle, not elsewhere classified: Secondary | ICD-10-CM | POA: Insufficient documentation
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
## 2015-12-26 ENCOUNTER — Ambulatory Visit: Payer: Medicare Other

## 2015-12-26 DIAGNOSIS — M25571 Pain in right ankle and joints of right foot: Secondary | ICD-10-CM

## 2015-12-26 DIAGNOSIS — M25671 Stiffness of right ankle, not elsewhere classified: Secondary | ICD-10-CM

## 2015-12-26 DIAGNOSIS — R262 Difficulty in walking, not elsewhere classified: Secondary | ICD-10-CM
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
## 2015-12-30 ENCOUNTER — Ambulatory Visit: Payer: Medicare Other

## 2015-12-30 DIAGNOSIS — M25671 Stiffness of right ankle, not elsewhere classified: Secondary | ICD-10-CM | POA: Diagnosis not present

## 2015-12-30 DIAGNOSIS — R262 Difficulty in walking, not elsewhere classified: Secondary | ICD-10-CM

## 2015-12-30 DIAGNOSIS — M25571 Pain in right ankle and joints of right foot: Secondary | ICD-10-CM
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
## 2016-01-02 ENCOUNTER — Ambulatory Visit: Payer: Medicare Other | Admitting: Physical Therapy

## 2016-01-10 ENCOUNTER — Ambulatory Visit: Payer: Medicare Other | Admitting: Physical Therapy

## 2016-01-10 DIAGNOSIS — M25671 Stiffness of right ankle, not elsewhere classified: Secondary | ICD-10-CM | POA: Diagnosis not present

## 2016-01-10 DIAGNOSIS — R262 Difficulty in walking, not elsewhere classified: Secondary | ICD-10-CM

## 2016-01-10 DIAGNOSIS — M25571 Pain in right ankle and joints of right foot: Secondary | ICD-10-CM
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
## 2016-03-25 LAB — HM PAP SMEAR

## 2016-03-30 ENCOUNTER — Encounter: Payer: Self-pay | Admitting: Internal Medicine

## 2016-04-03 ENCOUNTER — Encounter: Payer: Self-pay | Admitting: Internal Medicine

## 2016-06-23 ENCOUNTER — Other Ambulatory Visit: Payer: Self-pay | Admitting: Internal Medicine

## 2016-06-23 ENCOUNTER — Encounter: Payer: Self-pay | Admitting: Internal Medicine

## 2016-06-23 MED ORDER — AMBULATORY NON FORMULARY MEDICATION: 0 refills | Status: AC

## 2016-06-23 MED ORDER — HYDROCODONE-ACETAMINOPHEN 5-325 MG PO TABS
1.0000 | ORAL_TABLET | Freq: Three times a day (TID) | ORAL | 0 refills | Status: DC | PRN
Start: 2016-06-23 — End: 2016-08-18

## 2016-06-23 NOTE — Telephone Encounter (Signed)
Pt informed that Rx has been placed at front desk for pick up at her convenience.  

## 2016-06-23 NOTE — Telephone Encounter (Signed)
Ok 90 and no RF 

## 2016-06-23 NOTE — Telephone Encounter (Signed)
Pt is requesting refill on Hydrocodone.  Last OV: 12/16/2015 Last Fill: 12/16/2015 #90 and 0RF  (For August and September 2017) UDS: 10/31/2015 Low risk  Please advise.

## 2016-06-23 NOTE — Telephone Encounter (Signed)
Rx printed, awaiting MD signature.  

## 2016-07-08 ENCOUNTER — Telehealth: Payer: Self-pay | Admitting: Internal Medicine

## 2016-07-08 NOTE — Telephone Encounter (Signed)
This is okay with me, thx

## 2016-07-08 NOTE — Telephone Encounter (Signed)
Pt is asking to transfer care from Dr. Larose Kells to Elyn Aquas, please advise ok to schedule.

## 2016-07-08 NOTE — Telephone Encounter (Signed)
Called pt and LMOVM to return call.  °

## 2016-07-08 NOTE — Telephone Encounter (Signed)
Ok with me 

## 2016-07-10 ENCOUNTER — Encounter: Payer: Self-pay | Admitting: Physician Assistant

## 2016-07-10 ENCOUNTER — Ambulatory Visit (INDEPENDENT_AMBULATORY_CARE_PROVIDER_SITE_OTHER): Payer: Medicare Other | Admitting: Physician Assistant

## 2016-07-10 VITALS — BP 122/76 | HR 68 | Temp 98.5°F | Resp 14 | Ht 69.0 in | Wt 274.0 lb

## 2016-07-10 DIAGNOSIS — M159 Polyosteoarthritis, unspecified: Secondary | ICD-10-CM

## 2016-07-10 DIAGNOSIS — M150 Primary generalized (osteo)arthritis: Secondary | ICD-10-CM

## 2016-07-10 MED ORDER — METHYLPREDNISOLONE 4 MG PO TBPK: ORAL_TABLET | ORAL | 0 refills | Status: DC

## 2016-07-10 NOTE — Patient Instructions (Signed)
Please take steroid pack as directed. Ok to take tumeric with this.  Do not take Motrin or other non-steroidal anti-inflammatories with the steroid.  Please give thought to the Physical Therapy places below. Let me know who you pick so I can get you in for assessment. I am concerned about chronic nerve compression giving weakness. We may need repeat MRI very soon if symptoms are not improving with steroid.  Follow-up with me in 2 weeks.  [facility]. [facility] [place].

## 2016-07-10 NOTE — Progress Notes (Signed)
Patient presents to clinic today as a transfer of care from our [facility] office.   Osteoarthritis -- with fibromyalgia. Significant OA of shoulders, back, hips, knees bilaterally s/p TKF . Patient is currently prescribed chronic pain medications through her previous PCP. Is on Hydrocodone 5-325 mg daily. Has been on this regimen for quite some time. Patient has noted some increased pain in her R shoulder lately without trauma or injury. Has history of R rotator cuff repair. Denies numbness, tingling or weakness. Denies swelling or bruising. Notes arthritic pains are a dull and constant 4/10. Has noted increased left lower back pain with some weakness with lifting of left leg over the past few months. Notes radiation of pain into LLE on occasion. No bowel changes, nausea/vomiting, fever, chills, malaise/fatigue.  GYN -- Dr. Rogue Bussing  Past Medical History:  Diagnosis Date  . Asthma   . Chronic knee pain   . Diverticulitis   . Fibromyalgia   . Obesity   . Osteoarthritis   . Postmenopausal   . Rosacea     Current Outpatient Prescriptions on File Prior to Visit  Medication Sig Dispense Refill  . AMBULATORY NON FORMULARY MEDICATION Continue Rolfing therapy once weekly or as needed for joint pain and/or stabilizing gait. 1 Package 0  . cyclobenzaprine (FLEXERIL) 10 MG tablet Take 1 tablet (10 mg total) by mouth 2 (two) times daily as needed for muscle spasms. 90 tablet 1  . HYDROcodone-acetaminophen (NORCO/VICODIN) 5-325 MG tablet Take 1 tablet by mouth every 8 (eight) hours as needed. 90 tablet 0  . ibuprofen (ADVIL,MOTRIN) 200 MG tablet Take 200 mg by mouth every 8 (eight) hours as needed.    . neomycin-polymyxin b-dexamethasone (MAXITROL) 3.5-10000-0.1 SUSP Place 1 drop into both eyes as needed.      No current facility-administered medications on file prior to visit.     Allergies  Allergen Reactions  . Meloxicam Other (See Comments)    Per patient "Intestional cramps"    Family  History  Problem Relation Age of Onset  . Heart disease Father     MVP  . Colon cancer Neg Hx   . Breast cancer Neg Hx   . Heart attack Neg Hx   . Diabetes Neg Hx     Social History   Social History  . Marital status: Widowed    Spouse name: N/A  . Number of children: 3  . Years of education: N/A   Occupational History  . on disability Unemployed   Social History Main Topics  . Smoking status: Never Smoker  . Smokeless tobacco: Never Used  . Alcohol use No  . Drug use: No  . Sexual activity: Not Asked   Other Topics Concern  . 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  All other ROS are negative.  BP 122/76   Pulse 68   Temp 98.5 F (36.9 C) (Oral)   Resp 14   Ht 5\' 9"  (1.753 m)   Wt 274 lb (124.3 kg)   SpO2 98%   BMI 40.46 kg/m   Physical Exam  Constitutional: She is oriented to person, place, and time and  well-developed, well-nourished, and in no distress.  HENT:  Head: Normocephalic and atraumatic.  Eyes: Conjunctivae are normal.  Neck: Neck supple.  Cardiovascular: Normal rate, regular rhythm, normal heart sounds and intact distal pulses.   Pulmonary/Chest: Effort normal and breath sounds normal. No respiratory distress. She has no wheezes. She has no rales. She exhibits no tenderness.  Abdominal: Soft. Bowel sounds are normal. She exhibits no distension. There is no tenderness.  Musculoskeletal:       Right shoulder: Normal. She exhibits normal range of motion and no tenderness.       Right hip: She exhibits normal range of motion and normal strength.       Left hip: She exhibits decreased strength. She exhibits normal range of motion.       Thoracic back: She exhibits pain. She exhibits no tenderness, no bony tenderness and no spasm.       Lumbar back: She  exhibits pain. She exhibits no tenderness, no bony tenderness and no spasm.  Strength 3/5  Neurological: She is alert and oriented to person, place, and time.  Skin: Skin is warm and dry. No rash noted.  Psychiatric: Affect normal.  Vitals reviewed.  Assessment/Plan: 1. Primary osteoarthritis involving multiple joints Exacerbated with weather and due to weight. Continue pain medications. Recommended physical therapy. Patient will give some thought.  2. Lumbar back pain with sciatica Giving history, concerned for chronic nerve compression. PT recommended. Recommend MRI. Rx Medrol pack. Patient wishes to she how symptoms do with steroid before scheduling MRI. Follow-up scheduled.    Leeanne Rio, PA-C

## 2016-07-10 NOTE — Progress Notes (Signed)
Pre visit review using our clinic review tool, if applicable. No additional management support is needed unless otherwise documented below in the visit note. 

## 2016-07-22 ENCOUNTER — Ambulatory Visit: Payer: Medicare Other | Admitting: Internal Medicine

## 2016-07-28 ENCOUNTER — Other Ambulatory Visit: Payer: Self-pay | Admitting: Physician Assistant

## 2016-07-28 ENCOUNTER — Encounter: Payer: Self-pay | Admitting: Physician Assistant

## 2016-07-28 DIAGNOSIS — R2681 Unsteadiness on feet: Secondary | ICD-10-CM

## 2016-07-28 DIAGNOSIS — M545 Low back pain, unspecified: Secondary | ICD-10-CM

## 2016-07-28 DIAGNOSIS — G8929 Other chronic pain: Secondary | ICD-10-CM

## 2016-07-31 DIAGNOSIS — M62838 Other muscle spasm: Secondary | ICD-10-CM | POA: Diagnosis not present

## 2016-07-31 DIAGNOSIS — M25552 Pain in left hip: Secondary | ICD-10-CM | POA: Diagnosis not present

## 2016-08-04 DIAGNOSIS — M25552 Pain in left hip: Secondary | ICD-10-CM | POA: Diagnosis not present

## 2016-08-04 DIAGNOSIS — M62838 Other muscle spasm: Secondary | ICD-10-CM | POA: Diagnosis not present

## 2016-08-11 DIAGNOSIS — M62838 Other muscle spasm: Secondary | ICD-10-CM | POA: Diagnosis not present

## 2016-08-11 DIAGNOSIS — M25552 Pain in left hip: Secondary | ICD-10-CM | POA: Diagnosis not present

## 2016-08-13 DIAGNOSIS — M25552 Pain in left hip: Secondary | ICD-10-CM | POA: Diagnosis not present

## 2016-08-13 DIAGNOSIS — M62838 Other muscle spasm: Secondary | ICD-10-CM | POA: Diagnosis not present

## 2016-08-18 ENCOUNTER — Encounter: Payer: Self-pay | Admitting: Physician Assistant

## 2016-08-18 ENCOUNTER — Ambulatory Visit (INDEPENDENT_AMBULATORY_CARE_PROVIDER_SITE_OTHER): Payer: Medicare Other | Admitting: Physician Assistant

## 2016-08-18 VITALS — BP 130/78 | HR 76 | Temp 99.1°F | Resp 14 | Ht 69.0 in | Wt 275.0 lb

## 2016-08-18 DIAGNOSIS — J069 Acute upper respiratory infection, unspecified: Secondary | ICD-10-CM | POA: Diagnosis not present

## 2016-08-18 DIAGNOSIS — B9789 Other viral agents as the cause of diseases classified elsewhere: Secondary | ICD-10-CM

## 2016-08-18 MED ORDER — HYDROCODONE-ACETAMINOPHEN 5-325 MG PO TABS: 1.0000 | ORAL_TABLET | Freq: Three times a day (TID) | ORAL | 0 refills | Status: DC | PRN

## 2016-08-18 MED ORDER — BENZONATATE 100 MG PO CAPS: 100.0000 mg | ORAL_CAPSULE | Freq: Two times a day (BID) | ORAL | 0 refills | Status: DC | PRN

## 2016-08-18 NOTE — Progress Notes (Signed)
Patient presents to clinic today c/o 5 days of URI symptoms starting suddenly and gradually worsening. Notes starting with aches, chills and fatigue. Then developed dry cough, post-nasal drip. Tmax 100.4. Has been feeling better over the past 48 hours. Denies chest pain or shortness of breath. Twin sister is now having the same symptoms starting this morning. Has taken herbal supplements -- garlic and zinc, along with vitamins for symptom relief. Has also taken motrin for headache and aches.    Past Medical History:  Diagnosis Date  . Chronic knee pain   . Diverticulitis   . Fibromyalgia   . Obesity   . Osteoarthritis   . Postmenopausal   . Rosacea   . Spinal stenosis of lumbar region     Current Outpatient Prescriptions on File Prior to Visit  Medication Sig Dispense Refill  . AMBULATORY NON FORMULARY MEDICATION Continue Rolfing therapy once weekly or as needed for joint pain and/or stabilizing gait. 1 Package 0  . B Complex-C (B-COMPLEX WITH VITAMIN C) tablet Take 2 tablets by mouth daily.    Marland Kitchen BEE POLLEN PO Take 2 capsules by mouth 2 (two) times daily.    Marland Kitchen CALCIUM-MAGNESIUM-VITAMIN D PO Take 2 capsules by mouth 2 (two) times daily.    . Coenzyme Q10 (COQ10 PO) Take 1 capsule by mouth daily.    . cyclobenzaprine (FLEXERIL) 10 MG tablet Take 1 tablet (10 mg total) by mouth 2 (two) times daily as needed for muscle spasms. 90 tablet 1  . ELDERBERRY PO Take 2 capsules by mouth 2 (two) times daily.    Marland Kitchen HYDROcodone-acetaminophen (NORCO/VICODIN) 5-325 MG tablet Take 1 tablet by mouth every 8 (eight) hours as needed. 90 tablet 0  . ibuprofen (ADVIL,MOTRIN) 200 MG tablet Take 200 mg by mouth every 8 (eight) hours as needed.    Marland Kitchen MAGNESIUM HYDROXIDE PO Take 2 capsules by mouth at bedtime.    . Misc Natural Products [facility]) CAPS Take 1 capsule by mouth at bedtime as needed.    . Misc Natural Products (TURMERIC CURCUMIN) CAPS Take 2 capsules by mouth 2 (two) times daily.    Marland Kitchen  neomycin-polymyxin b-dexamethasone (MAXITROL) 3.5-10000-0.1 SUSP Place 1 drop into both eyes as needed.      No current facility-administered medications on file prior to visit.     Allergies  Allergen Reactions  . Meloxicam Other (See Comments)    Per patient "Intestional cramps"    Family History  Problem Relation Age of Onset  . Heart disease Father     MVP  . Colon cancer Neg Hx   . Breast cancer Neg Hx   . Heart attack Neg Hx   . Diabetes Neg Hx     Social History   Social History  . Marital status: Widowed    Spouse name: N/A  . Number of children: 3  . Years of education: N/A   Occupational History  . on disability Unemployed   Social History Main Topics  . Smoking status: Never Smoker  . Smokeless tobacco: Never Used  . Alcohol use No  . Drug use: No  . Sexual activity: Not Asked   Other Topics Concern  . 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  All other ROS are negative.  BP 130/78   Pulse 76   Temp 99.1 F (37.3 C) (Oral)   Resp 14   Ht 5\' 9"  (1.753 m)   Wt 275 lb (124.7 kg)   SpO2 98%   BMI 40.61 kg/m   Physical Exam  Constitutional: She is oriented to person, place, and time and well-developed, well-nourished, and in no distress.  HENT:  Head: Normocephalic and atraumatic.  Right Ear: Tympanic membrane and external ear normal.  Left Ear: Tympanic membrane and external ear normal.  Nose: Mucosal edema and rhinorrhea present. Right sinus exhibits no maxillary sinus tenderness and no frontal sinus tenderness. Left sinus exhibits no maxillary sinus tenderness and no frontal sinus tenderness.  Mouth/Throat: Oropharynx is clear and moist.  Eyes: Conjunctivae are normal.  Neck: Neck supple.  Cardiovascular: Normal rate, regular rhythm, normal heart  sounds and intact distal pulses.   Pulmonary/Chest: Effort normal and breath sounds normal. No respiratory distress. She has no wheezes. She has no rales. She exhibits no tenderness.  Lymphadenopathy:    She has no cervical adenopathy.  Neurological: She is alert and oriented to person, place, and time.  Skin: Skin is warm and dry. No rash noted.  Psychiatric: Affect normal.  Vitals reviewed.  Assessment/Plan: 1. Viral URI Likely flu but outside testing or treatment window. Symptoms resolving. Exam unremarkable. Supportive measures and OTC medications reviewed. Rx Tessalon for cough. - benzonatate (TESSALON) 100 MG capsule; Take 1 capsule (100 mg total) by mouth 2 (two) times daily as needed for cough.  Dispense: 20 capsule; Refill: 0   Leeanne Rio, [place]

## 2016-08-18 NOTE — Patient Instructions (Signed)
Please stay well-hydrated and get plenty of rest. Continue supplements. Place a humidifier in the bedroom.  Use the Tessalon as directed for cough.   Symptoms should continue to resolve.   Please keep your distance from [place] until symptoms are resolved. Lysol surfaces. Keep up washing those hands!

## 2016-08-18 NOTE — Progress Notes (Signed)
Pre visit review using our clinic review tool, if applicable. No additional management support is needed unless otherwise documented below in the visit note. 

## 2016-08-19 ENCOUNTER — Encounter: Payer: Self-pay | Admitting: Physician Assistant

## 2016-08-31 DIAGNOSIS — M62838 Other muscle spasm: Secondary | ICD-10-CM | POA: Diagnosis not present

## 2016-08-31 DIAGNOSIS — M25552 Pain in left hip: Secondary | ICD-10-CM | POA: Diagnosis not present

## 2016-09-03 DIAGNOSIS — M62838 Other muscle spasm: Secondary | ICD-10-CM | POA: Diagnosis not present

## 2016-09-03 DIAGNOSIS — M25552 Pain in left hip: Secondary | ICD-10-CM | POA: Diagnosis not present

## 2016-09-07 DIAGNOSIS — M62838 Other muscle spasm: Secondary | ICD-10-CM | POA: Diagnosis not present

## 2016-09-07 DIAGNOSIS — M25552 Pain in left hip: Secondary | ICD-10-CM | POA: Diagnosis not present

## 2016-09-10 DIAGNOSIS — M62838 Other muscle spasm: Secondary | ICD-10-CM | POA: Diagnosis not present

## 2016-09-10 DIAGNOSIS — M25552 Pain in left hip: Secondary | ICD-10-CM | POA: Diagnosis not present

## 2016-09-14 DIAGNOSIS — M62838 Other muscle spasm: Secondary | ICD-10-CM | POA: Diagnosis not present

## 2016-09-14 DIAGNOSIS — M25552 Pain in left hip: Secondary | ICD-10-CM | POA: Diagnosis not present

## 2016-09-17 DIAGNOSIS — M62838 Other muscle spasm: Secondary | ICD-10-CM | POA: Diagnosis not present

## 2016-09-17 DIAGNOSIS — M25552 Pain in left hip: Secondary | ICD-10-CM | POA: Diagnosis not present

## 2016-09-21 DIAGNOSIS — M25552 Pain in left hip: Secondary | ICD-10-CM | POA: Diagnosis not present

## 2016-09-21 DIAGNOSIS — M62838 Other muscle spasm: Secondary | ICD-10-CM | POA: Diagnosis not present

## 2016-09-24 DIAGNOSIS — M25552 Pain in left hip: Secondary | ICD-10-CM | POA: Diagnosis not present

## 2016-09-24 DIAGNOSIS — M62838 Other muscle spasm: Secondary | ICD-10-CM | POA: Diagnosis not present

## 2016-09-28 DIAGNOSIS — M25552 Pain in left hip: Secondary | ICD-10-CM | POA: Diagnosis not present

## 2016-09-28 DIAGNOSIS — M62838 Other muscle spasm: Secondary | ICD-10-CM | POA: Diagnosis not present

## 2016-10-01 DIAGNOSIS — M62838 Other muscle spasm: Secondary | ICD-10-CM | POA: Diagnosis not present

## 2016-10-01 DIAGNOSIS — M25552 Pain in left hip: Secondary | ICD-10-CM | POA: Diagnosis not present

## 2016-10-13 DIAGNOSIS — M25552 Pain in left hip: Secondary | ICD-10-CM | POA: Diagnosis not present

## 2016-10-13 DIAGNOSIS — M62838 Other muscle spasm: Secondary | ICD-10-CM | POA: Diagnosis not present

## 2016-10-16 DIAGNOSIS — M25552 Pain in left hip: Secondary | ICD-10-CM | POA: Diagnosis not present

## 2016-10-16 DIAGNOSIS — M62838 Other muscle spasm: Secondary | ICD-10-CM | POA: Diagnosis not present

## 2016-10-30 DIAGNOSIS — M25552 Pain in left hip: Secondary | ICD-10-CM | POA: Diagnosis not present

## 2016-10-30 DIAGNOSIS — M62838 Other muscle spasm: Secondary | ICD-10-CM | POA: Diagnosis not present

## 2016-11-24 DIAGNOSIS — D1801 Hemangioma of skin and subcutaneous tissue: Secondary | ICD-10-CM | POA: Diagnosis not present

## 2016-11-24 DIAGNOSIS — L905 Scar conditions and fibrosis of skin: Secondary | ICD-10-CM | POA: Diagnosis not present

## 2016-11-24 DIAGNOSIS — L821 Other seborrheic keratosis: Secondary | ICD-10-CM | POA: Diagnosis not present

## 2016-11-24 DIAGNOSIS — L304 Erythema intertrigo: Secondary | ICD-10-CM | POA: Diagnosis not present

## 2016-11-27 ENCOUNTER — Encounter: Payer: Self-pay | Admitting: Physician Assistant

## 2016-11-27 ENCOUNTER — Ambulatory Visit (INDEPENDENT_AMBULATORY_CARE_PROVIDER_SITE_OTHER): Payer: Medicare Other | Admitting: Physician Assistant

## 2016-11-27 VITALS — BP 118/76 | HR 62 | Temp 98.6°F | Resp 14 | Ht 69.0 in | Wt 277.0 lb

## 2016-11-27 DIAGNOSIS — M7521 Bicipital tendinitis, right shoulder: Secondary | ICD-10-CM | POA: Diagnosis not present

## 2016-11-27 NOTE — Progress Notes (Signed)
Pre visit review using our clinic review tool, if applicable. No additional management support is needed unless otherwise documented below in the visit note. 

## 2016-11-27 NOTE — Progress Notes (Signed)
Patient presents to clinic today c/o pain in the R shoulder since the weekend. Pain is worse with rotating the upper arm. Denies bruising, numbness, tingling. Has chronic weakness secondary to multiple prior shoulder surgeries. Has seen the Chiropractor 3 x week for adjustments. Was instructed to come see PCP to discuss medication for inflammation. Motrin helped some.   Past Medical History:  Diagnosis Date  . Chronic knee pain   . Diverticulitis   . Fibromyalgia   . Obesity   . Osteoarthritis   . Postmenopausal   . Rosacea   . Spinal stenosis of lumbar region     Current Outpatient Prescriptions on File Prior to Visit  Medication Sig Dispense Refill  . AMBULATORY NON FORMULARY MEDICATION Continue Rolfing therapy once weekly or as needed for joint pain and/or stabilizing gait. 1 Package 0  . B Complex-C (B-COMPLEX WITH VITAMIN C) tablet Take 2 tablets by mouth daily.    Marland Kitchen BEE POLLEN PO Take 2 capsules by mouth 2 (two) times daily.    Marland Kitchen CALCIUM-MAGNESIUM-VITAMIN D PO Take 2 capsules by mouth 2 (two) times daily.    . Coenzyme Q10 (COQ10 PO) Take 1 capsule by mouth daily.    . cyclobenzaprine (FLEXERIL) 10 MG tablet Take 1 tablet (10 mg total) by mouth 2 (two) times daily as needed for muscle spasms. 90 tablet 1  . ELDERBERRY PO Take 2 capsules by mouth 2 (two) times daily.    Marland Kitchen ibuprofen (ADVIL,MOTRIN) 200 MG tablet Take 200 mg by mouth every 8 (eight) hours as needed.    Marland Kitchen MAGNESIUM HYDROXIDE PO Take 2 capsules by mouth at bedtime.    . Misc Natural Products [facility]) CAPS Take 1 capsule by mouth at bedtime as needed.    . Misc Natural Products (TURMERIC CURCUMIN) CAPS Take 2 capsules by mouth 2 (two) times daily.    Marland Kitchen neomycin-polymyxin b-dexamethasone (MAXITROL) 3.5-10000-0.1 SUSP Place 1 drop into both eyes as needed.      No current facility-administered medications on file prior to visit.     Allergies  Allergen Reactions  . Meloxicam Other (See Comments)    Per  patient "Intestional cramps"    Family History  Problem Relation Age of Onset  . Heart disease Father        MVP  . Colon cancer Neg Hx   . Breast cancer Neg Hx   . Heart attack Neg Hx   . Diabetes Neg Hx     Social History   Social History  . Marital status: Widowed    Spouse name: N/A  . Number of children: 3  . Years of education: N/A   Occupational History  . on disability Unemployed   Social History Main Topics  . Smoking status: Never Smoker  . Smokeless tobacco: Never Used  . Alcohol use No  . Drug use: No  . Sexual activity: Not Asked   Other Topics Concern  . 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  All other ROS are negative.  BP 118/76   Pulse 62   Temp 98.6 F (37 C) (Oral)   Resp 14  Ht 5\' 9"  (1.753 m)   Wt 277 lb (125.6 kg)   SpO2 98%   BMI 40.91 kg/m   Physical Exam  Constitutional: She is oriented to person, place, and time and well-developed, well-nourished, and in no distress.  HENT:  Head: Normocephalic and atraumatic.  Eyes: Conjunctivae are normal.  Cardiovascular: Normal rate, regular rhythm, normal heart sounds and intact distal pulses.   Pulmonary/Chest: Effort normal and breath sounds normal. No respiratory distress. She has no wheezes. She has no rales. She exhibits no tenderness.  Musculoskeletal:       Right shoulder: She exhibits tenderness. She exhibits normal range of motion, no bony tenderness, no swelling, no spasm and normal pulse.       Arms: Neurological: She is alert and oriented to person, place, and time.  Skin: Skin is warm and dry. No rash noted.  Psychiatric: Affect normal.  Vitals reviewed.  Assessment/Plan: 1. Biceps tendinitis of right upper extremity Discussed cessation of resistance exercises for now. Alternate  Motrin and Tylenol as directed for pain. Topical Salon Pas discussed. If not improving may have to consider short course of steroid or sports medicine assessment.    Leeanne Rio, PA-C

## 2016-11-27 NOTE — Patient Instructions (Signed)
Please stop the resistance exercises over the next 4-5 days. This will help to calm down inflammation in the arm/shoulder.  Alternate Motrin 400-600 mg and Tylenol arthritis for pain. Take these with food.  If not improving, we will consider adding a steroid to the mix versus PT.

## 2017-01-07 IMAGING — US US CAROTID DUPLEX BILAT
1 series · 13 of 24 positions shown · non-contrast
Comparison: No prior

CLINICAL DATA: 65-year-old female with a history of bruit.

Cardiovascular risk factors are none
EXAM:
BILATERAL CAROTID DUPLEX ULTRASOUND
TECHNIQUE: Gray scale imaging, color Doppler and duplex ultrasound were
performed of bilateral carotid and vertebral arteries in the neck.

[Series 1: us carotid duplex bilat · 0.07mm/px · 13 of 64 slices shown]
[im 1/64]
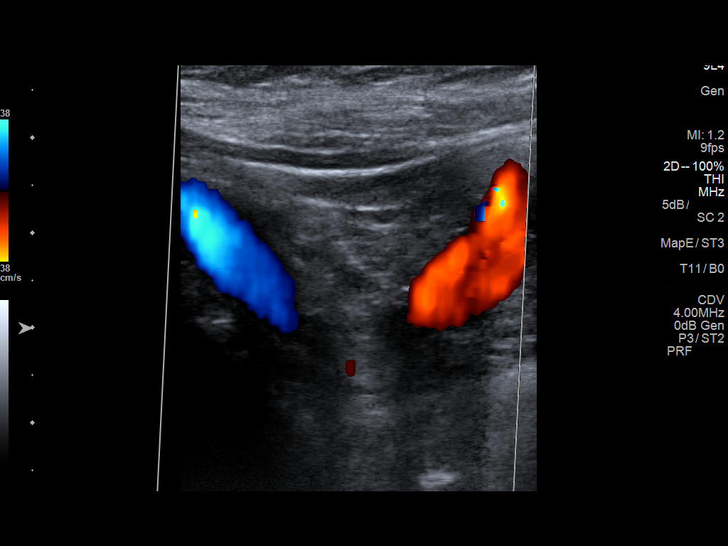
[im 6/64]
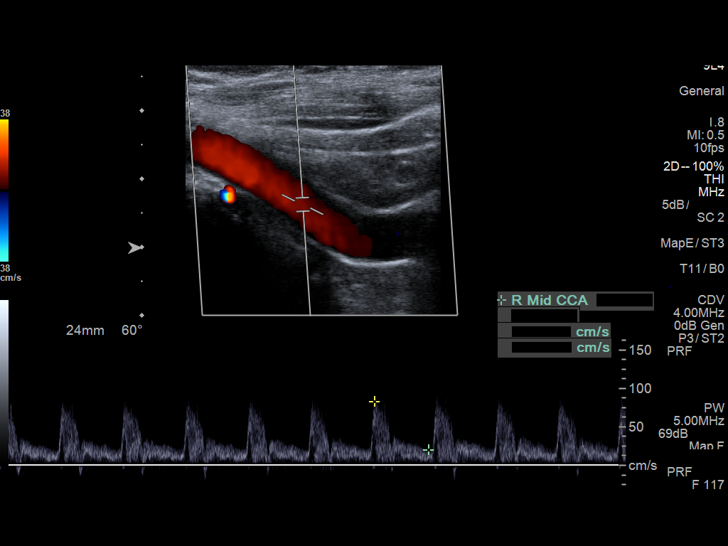
[im 11/64]
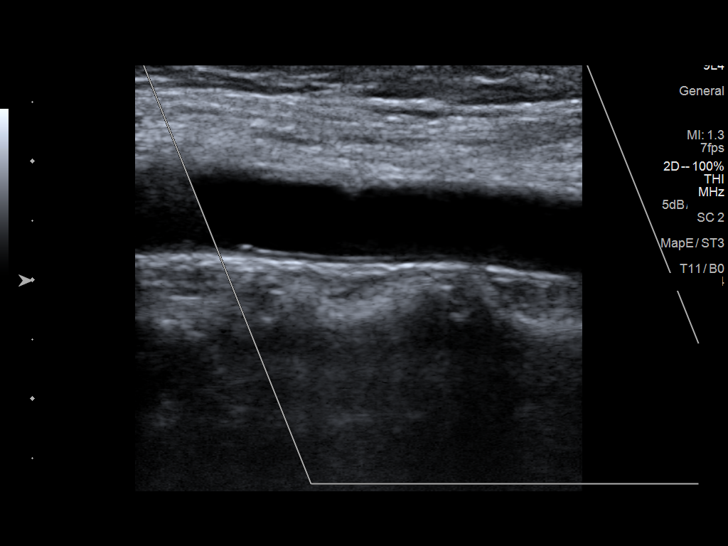
[im 17/64]
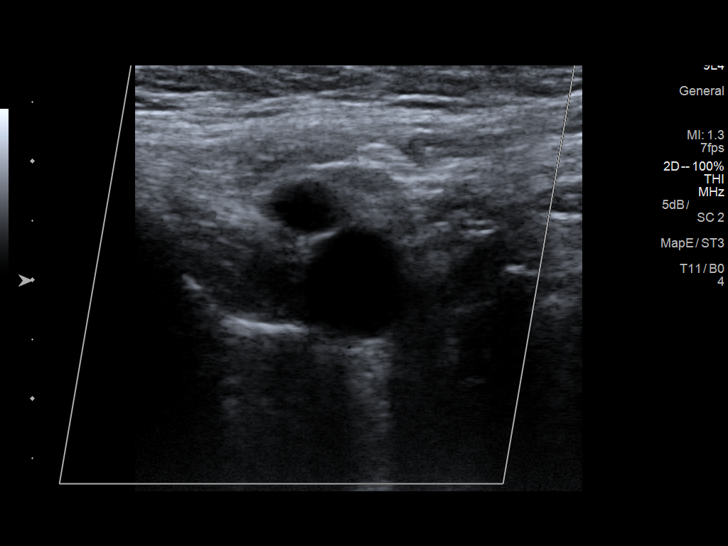
[im 22/64]
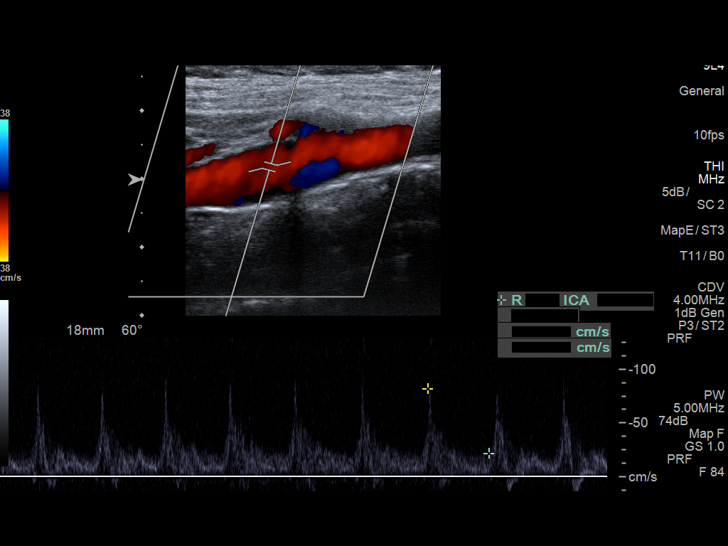
[im 28/64]
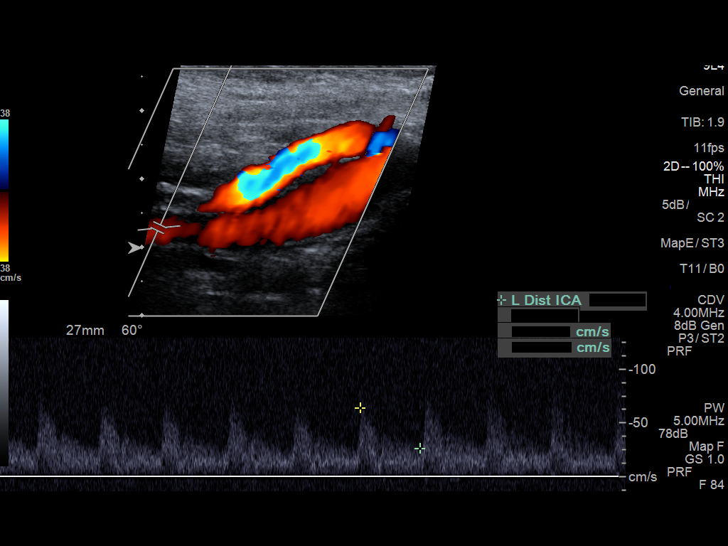
[im 33/64]
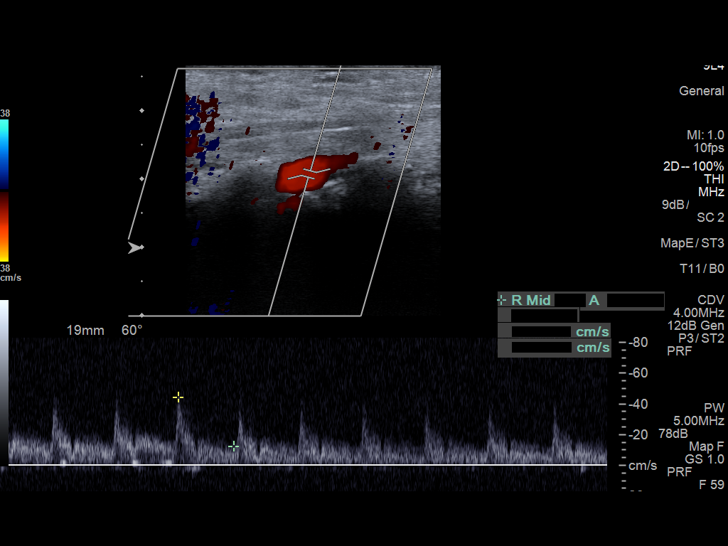
[im 36/64]
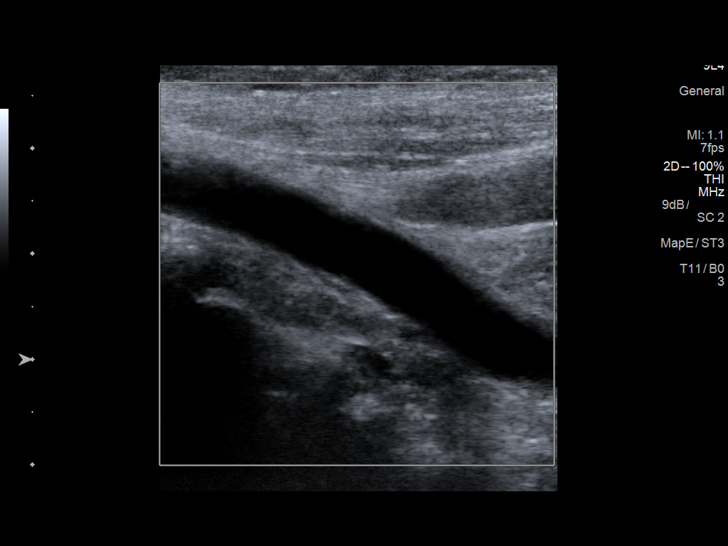
[im 42/64]
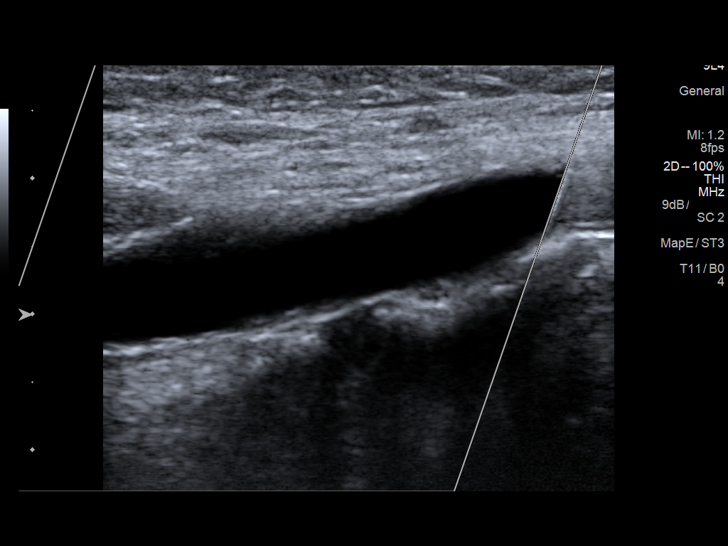
[im 47/64]
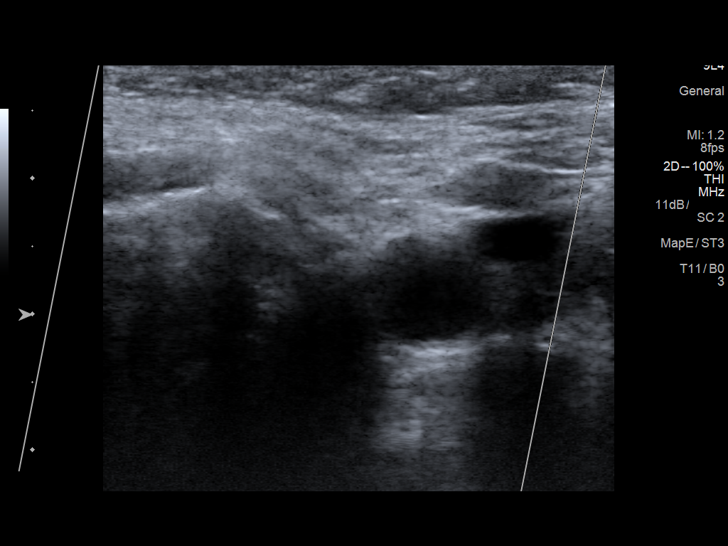
[im 53/64]
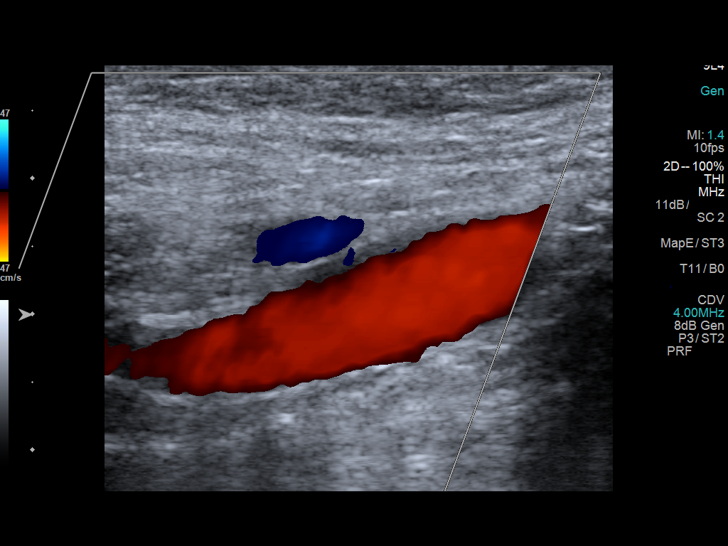
[im 58/64]
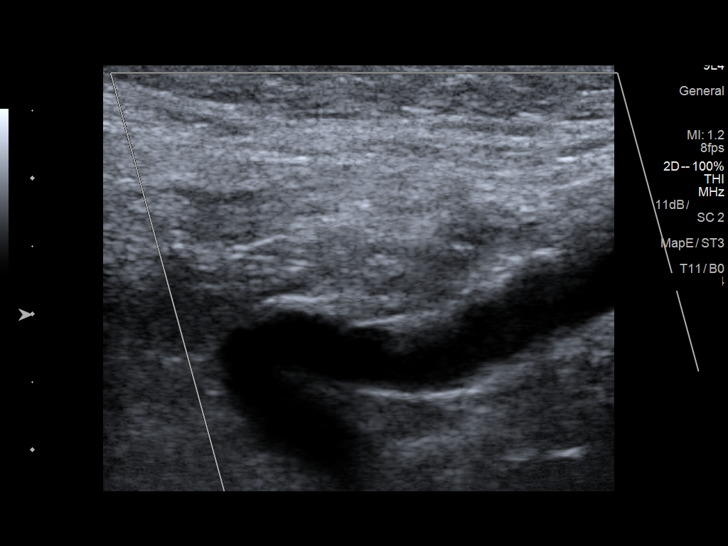
[im 64/64]
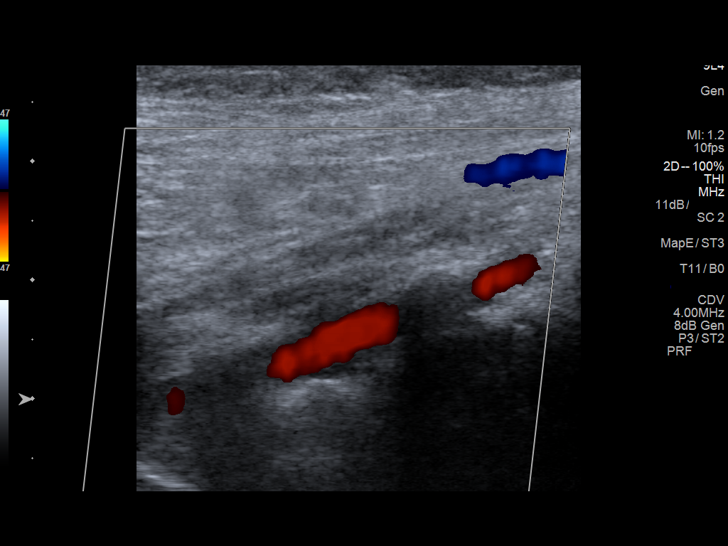

[13 of 24 positions shown; findings below may reference images not displayed]

FINDINGS: Criteria: Quantification of carotid stenosis is based on velocity
parameters that correlate the residual internal carotid diameter
with NASCET-based stenosis levels, using the diameter of the distal
internal carotid lumen as the denominator for stenosis measurement.

The following velocity measurements were obtained:

RIGHT

ICA:  Systolic 82 cm/sec, Diastolic 22 cm/sec

CCA:  83 cm/sec

SYSTOLIC ICA/CCA RATIO:

ECA:  59 cm/sec

LEFT

ICA:  Systolic 113 cm/sec, Diastolic 40 cm/sec

CCA:  87 cm/sec

SYSTOLIC ICA/CCA RATIO:

ECA:  77 cm/sec

Right Brachial SBP: 121

Left Brachial SBP: 129

RIGHT CAROTID ARTERY: No significant calcified disease of the right
common carotid artery. Intermediate waveform maintained.
Heterogeneous plaque without significant calcifications at the right
carotid bifurcation. Low resistance waveform of the right ICA.
Tortuosity.

RIGHT VERTEBRAL ARTERY: Antegrade flow with low resistance waveform.

LEFT CAROTID ARTERY: No significant calcified disease of the left
common carotid artery. Intermediate waveform maintained.
Heterogeneous plaque at the left carotid bifurcation without
significant calcifications. Low resistance waveform of the left ICA.
Tortuosity of the left system.

LEFT VERTEBRAL ARTERY:  Antegrade flow with low resistance waveform.
IMPRESSION: Color duplex indicates minimal heterogeneous plaque, with no
hemodynamically significant stenosis by duplex criteria in the
extracranial cerebrovascular circulation.

## 2017-01-11 DIAGNOSIS — H04123 Dry eye syndrome of bilateral lacrimal glands: Secondary | ICD-10-CM | POA: Diagnosis not present

## 2017-01-11 DIAGNOSIS — H01002 Unspecified blepharitis right lower eyelid: Secondary | ICD-10-CM | POA: Diagnosis not present

## 2017-01-11 DIAGNOSIS — H01005 Unspecified blepharitis left lower eyelid: Secondary | ICD-10-CM | POA: Diagnosis not present

## 2017-01-11 DIAGNOSIS — H524 Presbyopia: Secondary | ICD-10-CM | POA: Diagnosis not present

## 2017-01-11 DIAGNOSIS — D3131 Benign neoplasm of right choroid: Secondary | ICD-10-CM | POA: Diagnosis not present

## 2017-01-11 DIAGNOSIS — H01001 Unspecified blepharitis right upper eyelid: Secondary | ICD-10-CM | POA: Diagnosis not present

## 2017-01-11 DIAGNOSIS — H5213 Myopia, bilateral: Secondary | ICD-10-CM | POA: Diagnosis not present

## 2017-01-11 DIAGNOSIS — H52223 Regular astigmatism, bilateral: Secondary | ICD-10-CM | POA: Diagnosis not present

## 2017-01-11 DIAGNOSIS — H01004 Unspecified blepharitis left upper eyelid: Secondary | ICD-10-CM | POA: Diagnosis not present

## 2017-01-20 ENCOUNTER — Encounter: Payer: Self-pay | Admitting: Physician Assistant

## 2017-01-20 ENCOUNTER — Ambulatory Visit (INDEPENDENT_AMBULATORY_CARE_PROVIDER_SITE_OTHER): Payer: Medicare Other | Admitting: Physician Assistant

## 2017-01-20 VITALS — BP 116/72 | HR 72 | Temp 98.6°F | Resp 14 | Ht 69.0 in | Wt 283.0 lb

## 2017-01-20 DIAGNOSIS — R1032 Left lower quadrant pain: Secondary | ICD-10-CM | POA: Diagnosis not present

## 2017-01-20 LAB — CBC WITH DIFFERENTIAL/PLATELET
BASOS ABS: 0 10*3/uL (ref 0.0–0.1)
Basophils Relative: 0.3 % (ref 0.0–3.0)
EOS PCT: 1.6 % (ref 0.0–5.0)
Eosinophils Absolute: 0.2 10*3/uL (ref 0.0–0.7)
HEMATOCRIT: 45.4 % (ref 36.0–46.0)
Hemoglobin: 14.9 g/dL (ref 12.0–15.0)
LYMPHS PCT: 16.8 % (ref 12.0–46.0)
Lymphs Abs: 1.7 10*3/uL (ref 0.7–4.0)
MCHC: 32.9 g/dL (ref 30.0–36.0)
MCV: 90.6 fl (ref 78.0–100.0)
MONOS PCT: 6.3 % (ref 3.0–12.0)
Monocytes Absolute: 0.7 10*3/uL (ref 0.1–1.0)
NEUTROS ABS: 7.8 10*3/uL — AB (ref 1.4–7.7)
Neutrophils Relative %: 75 % (ref 43.0–77.0)
Platelets: 210 10*3/uL (ref 150.0–400.0)
RBC: 5.01 Mil/uL (ref 3.87–5.11)
RDW: 12.9 % (ref 11.5–15.5)
WBC: 10.3 10*3/uL (ref 4.0–10.5)

## 2017-01-20 LAB — POCT URINALYSIS DIPSTICK
Bilirubin, UA: NEGATIVE
GLUCOSE UA: NEGATIVE
Ketones, UA: NEGATIVE
NITRITE UA: NEGATIVE
Protein, UA: NEGATIVE
Spec Grav, UA: <=1.005 — AB (ref 1.010–1.025)
Urobilinogen, UA: 0.2 E.U./dL
pH, UA: 6.5 (ref 5.0–8.0)

## 2017-01-20 LAB — BASIC METABOLIC PANEL
BUN: 7 mg/dL (ref 6–23)
CALCIUM: 9.3 mg/dL (ref 8.4–10.5)
CO2: 29 meq/L (ref 19–32)
Chloride: 100 mEq/L (ref 96–112)
Creatinine, Ser: 0.54 mg/dL (ref 0.40–1.20)
GFR: 120.19 mL/min (ref >60.00)
Glucose, Bld: 86 mg/dL (ref 70–99)
POTASSIUM: 4.3 meq/L (ref 3.5–5.1)
SODIUM: 137 meq/L (ref 135–145)

## 2017-01-20 MED ORDER — AMOXICILLIN-POT CLAVULANATE 875-125 MG PO TABS: 1.0000 | ORAL_TABLET | Freq: Two times a day (BID) | ORAL | 0 refills | Status: DC

## 2017-01-20 NOTE — Progress Notes (Signed)
Patient presents to clinic today c/o 3 weeks of abdominal bloating with a few days of abdominal cramping with pain in LLQ. Has noted some urinary frequnecy and urgency without dysuria. Denies change to bowel habits. Has history of diverticulosis with diverticulitis. Says symptoms seem similar to prior episode. Denies fever, chills, nausea or vomiting.    Past Medical History:  Diagnosis Date  . Chronic knee pain   . Diverticulitis   . Fibromyalgia   . Obesity   . Osteoarthritis   . Postmenopausal   . Rosacea   . Spinal stenosis of lumbar region     Current Outpatient Prescriptions on File Prior to Visit  Medication Sig Dispense Refill  . AMBULATORY NON FORMULARY MEDICATION Continue Rolfing therapy once weekly or as needed for joint pain and/or stabilizing gait. 1 Package 0  . B Complex-C (B-COMPLEX WITH VITAMIN C) tablet Take 2 tablets by mouth daily.    Marland Kitchen BEE POLLEN PO Take 2 capsules by mouth 2 (two) times daily.    Marland Kitchen CALCIUM-MAGNESIUM-VITAMIN D PO Take 2 capsules by mouth 2 (two) times daily.    . Coenzyme Q10 (COQ10 PO) Take 1 capsule by mouth daily.    . cyclobenzaprine (FLEXERIL) 10 MG tablet Take 1 tablet (10 mg total) by mouth 2 (two) times daily as needed for muscle spasms. 90 tablet 1  . ELDERBERRY PO Take 2 capsules by mouth 2 (two) times daily.    Marland Kitchen ibuprofen (ADVIL,MOTRIN) 200 MG tablet Take 200 mg by mouth every 8 (eight) hours as needed.    Marland Kitchen MAGNESIUM HYDROXIDE PO Take 2 capsules by mouth at bedtime.    . Misc Natural Products [facility]) CAPS Take 1 capsule by mouth at bedtime as needed.    . Misc Natural Products (TURMERIC CURCUMIN) CAPS Take 2 capsules by mouth 2 (two) times daily.    Marland Kitchen neomycin-polymyxin b-dexamethasone (MAXITROL) 3.5-10000-0.1 SUSP Place 1 drop into both eyes as needed.      No current facility-administered medications on file prior to visit.     Allergies  Allergen Reactions  . Meloxicam Other (See Comments)    Per patient "Intestional  cramps"    Family History  Problem Relation Age of Onset  . Heart disease Father        MVP  . Colon cancer Neg Hx   . Breast cancer Neg Hx   . Heart attack Neg Hx   . Diabetes Neg Hx     Social History   Social History  . Marital status: Widowed    Spouse name: N/A  . Number of children: 3  . Years of education: N/A   Occupational History  . on disability Unemployed   Social History Main Topics  . Smoking status: Never Smoker  . Smokeless tobacco: Never Used  . Alcohol use No  . Drug use: No  . Sexual activity: Not Asked   Other Topics Concern  . 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  All other ROS are negative.  BP 116/72   Pulse 72   Temp 98.6 F (37 C) (Oral)   Resp 14   Ht  5\' 9"  (1.753 m)   Wt 283 lb (128.4 kg)   SpO2 100%   BMI 41.79 kg/m   Physical Exam  Constitutional: She is oriented to person, place, and time and well-developed, well-nourished, and in no distress.  HENT:  Head: Normocephalic and atraumatic.  Eyes: Conjunctivae are normal.  Neck: Neck supple.  Cardiovascular: Normal rate, regular rhythm, normal heart sounds and intact distal pulses.   Pulmonary/Chest: Effort normal and breath sounds normal. No respiratory distress. She has no wheezes. She has no rales. She exhibits no tenderness.  Abdominal: Soft. Normal appearance and bowel sounds are normal. There is tenderness in the left lower quadrant. There is negative Murphy's sign. No hernia.  Neurological: She is alert and oriented to person, place, and time.  Skin: Skin is warm and dry.  Vitals reviewed.  Assessment/Plan: 1. LLQ pain Concern for diverticular disease. No alarm signs/symptoms. Labs today. UA performed twice as initial reading showed LE and nitrites but coloration of strips was  off. Repeat with new tube of strips was unremarkable. Will send for culture. Will start Augmentin. Supportive measures reviewed. Will alter regimen based on labs.  - Basic metabolic panel - CBC w/Diff - POCT urinalysis dipstick - Urine Culture   Leeanne Rio, PA-C

## 2017-01-20 NOTE — Patient Instructions (Signed)
Please go to the lab for blood work. Stay well-hydrated and get plenty of rest.  Take medication as directed.   I will call with results and we will alter regimen accordingly.

## 2017-01-20 NOTE — Progress Notes (Signed)
Pre visit review using our clinic review tool, if applicable. No additional management support is needed unless otherwise documented below in the visit note. 

## 2017-01-21 LAB — URINE CULTURE
MICRO NUMBER: 80973875
Result:: NO GROWTH
SPECIMEN QUALITY:: ADEQUATE

## 2017-01-22 ENCOUNTER — Encounter: Payer: Self-pay | Admitting: Physician Assistant

## 2017-02-08 ENCOUNTER — Encounter: Payer: Self-pay | Admitting: Physician Assistant

## 2017-02-08 ENCOUNTER — Ambulatory Visit (INDEPENDENT_AMBULATORY_CARE_PROVIDER_SITE_OTHER): Payer: Medicare Other | Admitting: Physician Assistant

## 2017-02-08 ENCOUNTER — Ambulatory Visit (HOSPITAL_BASED_OUTPATIENT_CLINIC_OR_DEPARTMENT_OTHER)
Admission: RE | Admit: 2017-02-08 | Discharge: 2017-02-08 | Disposition: A | Discharge status: Home / Self Care | Payer: Medicare Other | Source: Ambulatory Visit | Attending: Physician Assistant | Admitting: Physician Assistant

## 2017-02-08 ENCOUNTER — Other Ambulatory Visit: Payer: Self-pay | Admitting: Physician Assistant

## 2017-02-08 VITALS — BP 118/76 | HR 76 | Temp 99.3°F | Resp 14 | Ht 69.0 in | Wt 283.0 lb

## 2017-02-08 DIAGNOSIS — K5792 Diverticulitis of intestine, part unspecified, without perforation or abscess without bleeding: Secondary | ICD-10-CM

## 2017-02-08 DIAGNOSIS — K6389 Other specified diseases of intestine: Secondary | ICD-10-CM | POA: Diagnosis not present

## 2017-02-08 DIAGNOSIS — R1032 Left lower quadrant pain: Secondary | ICD-10-CM

## 2017-02-08 DIAGNOSIS — K5730 Diverticulosis of large intestine without perforation or abscess without bleeding: Secondary | ICD-10-CM | POA: Diagnosis not present

## 2017-02-08 LAB — POCT URINALYSIS DIPSTICK
Bilirubin, UA: NEGATIVE
Glucose, UA: NEGATIVE
Ketones, UA: NEGATIVE
Leukocytes, UA: NEGATIVE
NITRITE UA: NEGATIVE
PH UA: 7 (ref 5.0–8.0)
Protein, UA: NEGATIVE
RBC UA: NEGATIVE
Spec Grav, UA: 1.02 (ref 1.010–1.025)
Urobilinogen, UA: 0.2 E.U./dL

## 2017-02-08 LAB — CBC WITH DIFFERENTIAL/PLATELET
BASOS ABS: 0 10*3/uL (ref 0.0–0.1)
Basophils Relative: 0.3 % (ref 0.0–3.0)
EOS ABS: 0 10*3/uL (ref 0.0–0.7)
Eosinophils Relative: 0.5 % (ref 0.0–5.0)
HEMATOCRIT: 45.5 % (ref 36.0–46.0)
Hemoglobin: 14.9 g/dL (ref 12.0–15.0)
LYMPHS ABS: 1.5 10*3/uL (ref 0.7–4.0)
LYMPHS PCT: 14.2 % (ref 12.0–46.0)
MCHC: 32.8 g/dL (ref 30.0–36.0)
MCV: 91 fl (ref 78.0–100.0)
MONOS PCT: 5.7 % (ref 3.0–12.0)
Monocytes Absolute: 0.6 10*3/uL (ref 0.1–1.0)
NEUTROS ABS: 8.2 10*3/uL — AB (ref 1.4–7.7)
NEUTROS PCT: 79.3 % — AB (ref 43.0–77.0)
PLATELETS: 195 10*3/uL (ref 150.0–400.0)
RBC: 5 Mil/uL (ref 3.87–5.11)
RDW: 12.9 % (ref 11.5–15.5)
WBC: 10.3 10*3/uL (ref 4.0–10.5)

## 2017-02-08 MED ORDER — METRONIDAZOLE 500 MG PO TABS: 500.0000 mg | ORAL_TABLET | Freq: Three times a day (TID) | ORAL | 0 refills | Status: DC

## 2017-02-08 MED ORDER — IOPAMIDOL (ISOVUE-300) INJECTION 61%
100.0000 mL | Freq: Once | INTRAVENOUS | Status: AC | PRN
  Administered 2017-02-08: 100 mL via INTRAVENOUS

## 2017-02-08 MED ORDER — CIPROFLOXACIN HCL 500 MG PO TABS: 500.0000 mg | ORAL_TABLET | Freq: Two times a day (BID) | ORAL | 0 refills | Status: DC

## 2017-02-08 NOTE — Progress Notes (Signed)
Pre visit review using our clinic review tool, if applicable. No additional management support is needed unless otherwise documented below in the visit note. 

## 2017-02-08 NOTE — Patient Instructions (Signed)
Please go to the lab for blood work. Then stop by the front desk so [name] can schedule for CT scan. I will call with results. We are getting this done ASAP. We will treat based on findings.  Keep hydrated. If anything acutely worsens, please go to the ER for assessment.

## 2017-02-08 NOTE — Progress Notes (Signed)
Patient presents to clinic today c/o recurrence of cramping and pain in LLQ s/p treatment for suspected diverticulitis with Augmentin. Patient endorses completing entire course of antibiotic with resolution in her symptoms. Completed over a week ago. Since last night has noted recurrent LLQ pain and abdominal cramping. Denies fever, chills, nausea or vomiting. Denies change in diet.   Past Medical History:  Diagnosis Date  . Chronic knee pain   . Diverticulitis   . Fibromyalgia   . Obesity   . Osteoarthritis   . Postmenopausal   . Rosacea   . Spinal stenosis of lumbar region     Current Outpatient Prescriptions on File Prior to Visit  Medication Sig Dispense Refill  . AMBULATORY NON FORMULARY MEDICATION Continue Rolfing therapy once weekly or as needed for joint pain and/or stabilizing gait. 1 Package 0  . B Complex-C (B-COMPLEX WITH VITAMIN C) tablet Take 2 tablets by mouth daily.    Marland Kitchen BEE POLLEN PO Take 2 capsules by mouth 2 (two) times daily.    Marland Kitchen CALCIUM-MAGNESIUM-VITAMIN D PO Take 2 capsules by mouth 2 (two) times daily.    . Coenzyme Q10 (COQ10 PO) Take 1 capsule by mouth daily.    . cyclobenzaprine (FLEXERIL) 10 MG tablet Take 1 tablet (10 mg total) by mouth 2 (two) times daily as needed for muscle spasms. 90 tablet 1  . ELDERBERRY PO Take 2 capsules by mouth 2 (two) times daily.    Marland Kitchen ibuprofen (ADVIL,MOTRIN) 200 MG tablet Take 200 mg by mouth every 8 (eight) hours as needed.    Marland Kitchen MAGNESIUM HYDROXIDE PO Take 2 capsules by mouth at bedtime.    . Misc Natural Products [facility]) CAPS Take 1 capsule by mouth at bedtime as needed.    . Misc Natural Products (TURMERIC CURCUMIN) CAPS Take 2 capsules by mouth 2 (two) times daily.    Marland Kitchen neomycin-polymyxin b-dexamethasone (MAXITROL) 3.5-10000-0.1 SUSP Place 1 drop into both eyes as needed.      No current facility-administered medications on file prior to visit.     Allergies  Allergen Reactions  . Meloxicam Other (See Comments)     Per patient "Intestional cramps"    Family History  Problem Relation Age of Onset  . Heart disease Father        MVP  . Colon cancer Neg Hx   . Breast cancer Neg Hx   . Heart attack Neg Hx   . Diabetes Neg Hx     Social History   Social History  . Marital status: Widowed    Spouse name: N/A  . Number of children: 3  . Years of education: N/A   Occupational History  . on disability Unemployed   Social History Main Topics  . Smoking status: Never Smoker  . Smokeless tobacco: Never Used  . Alcohol use No  . Drug use: No  . Sexual activity: Not Asked   Other Topics Concern  . 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  All other ROS are negative.  BP 118/76   Temp 99.3 F (37.4 C) (Oral)   Resp 14   Ht 5\' 9"  (1.753  m)   Wt 283 lb (128.4 kg)   BMI 41.79 kg/m   Physical Exam  Constitutional: She is oriented to person, place, and time and well-developed, well-nourished, and in no distress.  HENT:  Head: Normocephalic and atraumatic.  Eyes: Conjunctivae are normal.  Neck: Neck supple.  Cardiovascular: Normal rate, regular rhythm, normal heart sounds and intact distal pulses.   Pulmonary/Chest: Effort normal and breath sounds normal. No respiratory distress. She has no wheezes. She has no rales. She exhibits no tenderness.  Abdominal: Soft. Bowel sounds are normal. There is tenderness in the left lower quadrant.  Neurological: She is alert and oriented to person, place, and time.  Skin: Skin is warm and dry. No rash noted.  Vitals reviewed.  Recent Results (from the past 2160 hour(s))  POCT urinalysis dipstick     Status: Abnormal   Collection Time: 01/20/17  2:45 PM  Result Value Ref Range   Color, UA yellow    Clarity, UA clear    Glucose, UA negative    Bilirubin,  UA negative    Ketones, UA negative    Spec Grav, UA <=1.005 (A) 1.010 - 1.025   Blood, UA trace    pH, UA 6.5 5.0 - 8.0   Protein, UA negative    Urobilinogen, UA 0.2 0.2 or 1.0 E.U./dL   Nitrite, UA negative    Leukocytes, UA Small (1+) (A) Negative  Basic metabolic panel     Status: None   Collection Time: 01/20/17  2:50 PM  Result Value Ref Range   Sodium 137 135 - 145 mEq/L   Potassium 4.3 3.5 - 5.1 mEq/L   Chloride 100 96 - 112 mEq/L   CO2 29 19 - 32 mEq/L   Glucose, Bld 86 70 - 99 mg/dL   BUN 7 6 - 23 mg/dL   Creatinine, Ser 0.54 0.40 - 1.20 mg/dL   Calcium 9.3 8.4 - 10.5 mg/dL   GFR 120.19 >60.00 mL/min  CBC w/Diff     Status: Abnormal   Collection Time: 01/20/17  2:50 PM  Result Value Ref Range   WBC 10.3 4.0 - 10.5 K/uL   RBC 5.01 3.87 - 5.11 Mil/uL   Hemoglobin 14.9 12.0 - 15.0 g/dL   HCT 45.4 36.0 - 46.0 %   MCV 90.6 78.0 - 100.0 fl   MCHC 32.9 30.0 - 36.0 g/dL   RDW 12.9 11.5 - 15.5 %   Platelets 210.0 150.0 - 400.0 K/uL   Neutrophils Relative % 75.0 43.0 - 77.0 %   Lymphocytes Relative 16.8 12.0 - 46.0 %   Monocytes Relative 6.3 3.0 - 12.0 %   Eosinophils Relative 1.6 0.0 - 5.0 %   Basophils Relative 0.3 0.0 - 3.0 %   Neutro Abs 7.8 (H) 1.4 - 7.7 K/uL   Lymphs Abs 1.7 0.7 - 4.0 K/uL   Monocytes Absolute 0.7 0.1 - 1.0 K/uL   Eosinophils Absolute 0.2 0.0 - 0.7 K/uL   Basophils Absolute 0.0 0.0 - 0.1 K/uL  Urine Culture     Status: None   Collection Time: 01/20/17  2:51 PM  Result Value Ref Range   MICRO NUMBER: [id number]    SPECIMEN QUALITY: ADEQUATE    Sample Source NOT GIVEN    STATUS: FINAL    Result: No Growth    Assessment/Plan: 1. Left lower quadrant pain Low-grade fever. Concern for recurrent diverticulitis. Will check CBC. Will obtain STAT CT today to confirm before restarting ABX as there are no alarm symptoms  present. If CT confirmatory and negative for abscess or other emergent findings, will start Cipro/Flagyl combination. - CBC w/Diff -  POCT urinalysis dipstick - CT ABDOMEN PELVIS W CONTRAST; Future   Leeanne Rio, PA-C

## 2017-02-10 ENCOUNTER — Encounter: Payer: Self-pay | Admitting: Physician Assistant

## 2017-02-16 ENCOUNTER — Telehealth: Payer: Self-pay

## 2017-02-16 ENCOUNTER — Ambulatory Visit: Payer: Medicare Other | Admitting: Physician Assistant

## 2017-02-16 NOTE — Telephone Encounter (Signed)
LM requesting call back regarding AWV. Requesting patient to complete AWV with [facility] Coach on 02/18/17 @ 1030, prior to appointment with PCP.

## 2017-02-18 ENCOUNTER — Encounter: Payer: Self-pay | Admitting: Physician Assistant

## 2017-02-18 ENCOUNTER — Ambulatory Visit (INDEPENDENT_AMBULATORY_CARE_PROVIDER_SITE_OTHER): Payer: Medicare Other | Admitting: Physician Assistant

## 2017-02-18 DIAGNOSIS — K5792 Diverticulitis of intestine, part unspecified, without perforation or abscess without bleeding: Secondary | ICD-10-CM | POA: Diagnosis not present

## 2017-02-18 MED ORDER — METRONIDAZOLE 500 MG PO TABS: 500.0000 mg | ORAL_TABLET | Freq: Three times a day (TID) | ORAL | 0 refills | Status: DC

## 2017-02-18 MED ORDER — CIPROFLOXACIN HCL 500 MG PO TABS: 500.0000 mg | ORAL_TABLET | Freq: Two times a day (BID) | ORAL | 0 refills | Status: DC

## 2017-02-18 NOTE — Telephone Encounter (Signed)
Was in the office today to see [place]. I scheduled her an AWV on 02/24/17 @ 1pm with you.

## 2017-02-18 NOTE — Progress Notes (Signed)
Pre visit review using our clinic review tool, if applicable. No additional management support is needed unless otherwise documented below in the visit note. 

## 2017-02-18 NOTE — Patient Instructions (Signed)
I am glad you are doing much better.  Giving some very mild but residual symptoms I am extending course of antibiotic by three days.  Keep hydrated and keep up with diet.   Please call if symptoms are not completely resolved with completion of medication. If there is any recurrence we will have to set you up with Gastroenterology.   Call me Monday and let me know how you are!

## 2017-02-18 NOTE — Progress Notes (Signed)
Patient presents to clinic today for follow-up of diverticulitis. At last visit, infection was confirmed with CT scan. Patient started on 7-day supply of Ciprofloxacin and Flagyl for infection. Has taken as directed. Tolerating well. Notes almost all symptoms have resolved. Still having some mild pain in LLQ. Denies fever, chills or malaise. Notes normal bowel and bladder habits.   Past Medical History:  Diagnosis Date  . Chronic knee pain   . Diverticulitis   . Fibromyalgia   . Obesity   . Osteoarthritis   . Postmenopausal   . Rosacea   . Spinal stenosis of lumbar region     Current Outpatient Prescriptions on File Prior to Visit  Medication Sig Dispense Refill  . AMBULATORY NON FORMULARY MEDICATION Continue Rolfing therapy once weekly or as needed for joint pain and/or stabilizing gait. 1 Package 0  . B Complex-C (B-COMPLEX WITH VITAMIN C) tablet Take 2 tablets by mouth daily.    Marland Kitchen BEE POLLEN PO Take 2 capsules by mouth 2 (two) times daily.    Marland Kitchen CALCIUM-MAGNESIUM-VITAMIN D PO Take 2 capsules by mouth 2 (two) times daily.    . ciprofloxacin (CIPRO) 500 MG tablet Take 1 tablet (500 mg total) by mouth 2 (two) times daily. 14 tablet 0  . Coenzyme Q10 (COQ10 PO) Take 1 capsule by mouth daily.    . cyclobenzaprine (FLEXERIL) 10 MG tablet Take 1 tablet (10 mg total) by mouth 2 (two) times daily as needed for muscle spasms. 90 tablet 1  . ELDERBERRY PO Take 2 capsules by mouth 2 (two) times daily.    Marland Kitchen ibuprofen (ADVIL,MOTRIN) 200 MG tablet Take 200 mg by mouth every 8 (eight) hours as needed.    Marland Kitchen MAGNESIUM HYDROXIDE PO Take 2 capsules by mouth at bedtime.    . metroNIDAZOLE (FLAGYL) 500 MG tablet Take 1 tablet (500 mg total) by mouth 3 (three) times daily. 21 tablet 0  . Misc Natural Products [facility]) CAPS Take 1 capsule by mouth at bedtime as needed.    . Misc Natural Products (TURMERIC CURCUMIN) CAPS Take 2 capsules by mouth 2 (two) times daily.    Marland Kitchen neomycin-polymyxin  b-dexamethasone (MAXITROL) 3.5-10000-0.1 SUSP Place 1 drop into both eyes as needed.      No current facility-administered medications on file prior to visit.     Allergies  Allergen Reactions  . Meloxicam Other (See Comments)    Per patient "Intestional cramps"    Family History  Problem Relation Age of Onset  . Heart disease Father        MVP  . Colon cancer Neg Hx   . Breast cancer Neg Hx   . Heart attack Neg Hx   . Diabetes Neg Hx     Social History   Social History  . Marital status: Widowed    Spouse name: N/A  . Number of children: 3  . Years of education: N/A   Occupational History  . on disability Unemployed   Social History Main Topics  . Smoking status: Never Smoker  . Smokeless tobacco: Never Used  . Alcohol use No  . Drug use: No  . Sexual activity: Not Asked   Other Topics Concern  . 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  All other ROS are negative.  BP 112/70   Pulse 68   Temp 98.5 F (36.9 C) (Oral)   Resp 14   Ht 5\' 9"  (1.753 m)   Wt 272 lb (123.4 kg)   SpO2 99%   BMI 40.17 kg/m   Physical Exam  Constitutional: She is oriented to person, place, and time and well-developed, well-nourished, and in no distress.  HENT:  Head: Normocephalic and atraumatic.  Eyes: Conjunctivae are normal.  Cardiovascular: Normal rate, regular rhythm, normal heart sounds and intact distal pulses.   Pulmonary/Chest: Effort normal and breath sounds normal. No respiratory distress. She has no wheezes. She has no rales. She exhibits no tenderness.  Abdominal: Soft. Bowel sounds are normal. She exhibits no distension and no mass. There is no rebound and no guarding.  Very mild residual LLQ pain without guarding or rebound tenderness  Neurological: She is alert and  oriented to person, place, and time.  Skin: Skin is warm and dry. No rash noted.  Psychiatric: Affect normal.  Vitals reviewed.   Recent Results (from the past 2160 hour(s))  POCT urinalysis dipstick     Status: Abnormal   Collection Time: 01/20/17  2:45 PM  Result Value Ref Range   Color, UA yellow    Clarity, UA clear    Glucose, UA negative    Bilirubin, UA negative    Ketones, UA negative    Spec Grav, UA <=1.005 (A) 1.010 - 1.025   Blood, UA trace    pH, UA 6.5 5.0 - 8.0   Protein, UA negative    Urobilinogen, UA 0.2 0.2 or 1.0 E.U./dL   Nitrite, UA negative    Leukocytes, UA Small (1+) (A) Negative  Basic metabolic panel     Status: None   Collection Time: 01/20/17  2:50 PM  Result Value Ref Range   Sodium 137 135 - 145 mEq/L   Potassium 4.3 3.5 - 5.1 mEq/L   Chloride 100 96 - 112 mEq/L   CO2 29 19 - 32 mEq/L   Glucose, Bld 86 70 - 99 mg/dL   BUN 7 6 - 23 mg/dL   Creatinine, Ser 0.54 0.40 - 1.20 mg/dL   Calcium 9.3 8.4 - 10.5 mg/dL   GFR 120.19 >60.00 mL/min  CBC w/Diff     Status: Abnormal   Collection Time: 01/20/17  2:50 PM  Result Value Ref Range   WBC 10.3 4.0 - 10.5 K/uL   RBC 5.01 3.87 - 5.11 Mil/uL   Hemoglobin 14.9 12.0 - 15.0 g/dL   HCT 45.4 36.0 - 46.0 %   MCV 90.6 78.0 - 100.0 fl   MCHC 32.9 30.0 - 36.0 g/dL   RDW 12.9 11.5 - 15.5 %   Platelets 210.0 150.0 - 400.0 K/uL   Neutrophils Relative % 75.0 43.0 - 77.0 %   Lymphocytes Relative 16.8 12.0 - 46.0 %   Monocytes Relative 6.3 3.0 - 12.0 %   Eosinophils Relative 1.6 0.0 - 5.0 %   Basophils Relative 0.3 0.0 - 3.0 %   Neutro Abs 7.8 (H) 1.4 - 7.7 K/uL   Lymphs Abs 1.7 0.7 - 4.0 K/uL   Monocytes Absolute 0.7 0.1 - 1.0 K/uL   Eosinophils Absolute 0.2 0.0 - 0.7 K/uL   Basophils Absolute 0.0 0.0 - 0.1 K/uL  Urine Culture     Status: None   Collection Time: 01/20/17  2:51 PM  Result Value Ref Range   MICRO NUMBER:  [id number]    SPECIMEN QUALITY: ADEQUATE    Sample Source NOT GIVEN    STATUS:  FINAL    Result: No Growth   CBC w/Diff     Status: Abnormal   Collection Time: 02/08/17 12:40 PM  Result Value Ref Range   WBC 10.3 4.0 - 10.5 K/uL   RBC 5.00 3.87 - 5.11 Mil/uL   Hemoglobin 14.9 12.0 - 15.0 g/dL   HCT 45.5 36.0 - 46.0 %   MCV 91.0 78.0 - 100.0 fl   MCHC 32.8 30.0 - 36.0 g/dL   RDW 12.9 11.5 - 15.5 %   Platelets 195.0 150.0 - 400.0 K/uL   Neutrophils Relative % 79.3 (H) 43.0 - 77.0 %   Lymphocytes Relative 14.2 12.0 - 46.0 %   Monocytes Relative 5.7 3.0 - 12.0 %   Eosinophils Relative 0.5 0.0 - 5.0 %   Basophils Relative 0.3 0.0 - 3.0 %   Neutro Abs 8.2 (H) 1.4 - 7.7 K/uL   Lymphs Abs 1.5 0.7 - 4.0 K/uL   Monocytes Absolute 0.6 0.1 - 1.0 K/uL   Eosinophils Absolute 0.0 0.0 - 0.7 K/uL   Basophils Absolute 0.0 0.0 - 0.1 K/uL  POCT urinalysis dipstick     Status: Normal   Collection Time: 02/08/17 12:54 PM  Result Value Ref Range   Color, UA yellow    Clarity, UA clear    Glucose, UA negative    Bilirubin, UA negative    Ketones, UA negative    Spec Grav, UA 1.020 1.010 - 1.025   Blood, UA negative    pH, UA 7.0 5.0 - 8.0   Protein, UA negative    Urobilinogen, UA 0.2 0.2 or 1.0 E.U./dL   Nitrite, UA negative    Leukocytes, UA Negative Negative    Assessment/Plan: 1. Acute diverticulitis Will extend course by 3 days for a 10-day total course giving mild residual pain. Supportive measures reviewed. Return precautions discussed with patient.  - metroNIDAZOLE (FLAGYL) 500 MG tablet; Take 1 tablet (500 mg total) by mouth 3 (three) times daily.  Dispense: 9 tablet; Refill: 0 - ciprofloxacin (CIPRO) 500 MG tablet; Take 1 tablet (500 mg total) by mouth 2 (two) times daily.  Dispense: 6 tablet; Refill: 0   Leeanne Rio, [place]

## 2017-02-24 ENCOUNTER — Ambulatory Visit (INDEPENDENT_AMBULATORY_CARE_PROVIDER_SITE_OTHER): Payer: Medicare Other

## 2017-02-24 VITALS — BP 110/64 | HR 70 | Ht 69.0 in | Wt 268.4 lb

## 2017-02-24 DIAGNOSIS — E2839 Other primary ovarian failure: Secondary | ICD-10-CM | POA: Diagnosis not present

## 2017-02-24 DIAGNOSIS — Z1231 Encounter for screening mammogram for malignant neoplasm of breast: Secondary | ICD-10-CM | POA: Diagnosis not present

## 2017-02-24 DIAGNOSIS — Z1239 Encounter for other screening for malignant neoplasm of breast: Secondary | ICD-10-CM

## 2017-02-24 DIAGNOSIS — Z0000 Encounter for general adult medical examination without abnormal findings: Secondary | ICD-10-CM | POA: Diagnosis not present
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
## 2017-02-24 NOTE — Patient Instructions (Addendum)
Schedule mammogram and bone scan.   Bring a copy of your living will and/or healthcare power of attorney to your next office visit.  Continue doing brain stimulating activities (puzzles, reading, adult coloring books, staying active) to keep memory sharp.    Fall Prevention in the Home Falls can cause injuries. They can happen to people of all ages. There are many things you can do to make your home safe and to help prevent falls. What can I do on the outside of my home?  Regularly fix the edges of walkways and driveways and fix any cracks.  Remove anything that might make you trip as you walk through a door, such as a raised step or threshold.  Trim any bushes or trees on the path to your home.  Use bright outdoor lighting.  Clear any walking paths of anything that might make someone trip, such as rocks or tools.  Regularly check to see if handrails are loose or broken. Make sure that both sides of any steps have handrails.  Any raised decks and porches should have guardrails on the edges.  Have any leaves, snow, or ice cleared regularly.  Use sand or salt on walking paths during winter.  Clean up any spills in your garage right away. This includes oil or grease spills. What can I do in the bathroom?  Use night lights.  Install grab bars by the toilet and in the tub and shower. Do not use towel bars as grab bars.  Use non-skid mats or decals in the tub or shower.  If you need to sit down in the shower, use a plastic, non-slip stool.  Keep the floor dry. Clean up any water that spills on the floor as soon as it happens.  Remove soap buildup in the tub or shower regularly.  Attach bath mats securely with double-sided non-slip rug tape.  Do not have throw rugs and other things on the floor that can make you trip. What can I do in the bedroom?  Use night lights.  Make sure that you have a light by your bed that is easy to reach.  Do not use any sheets or blankets  that are too big for your bed. They should not hang down onto the floor.  Have a firm chair that has side arms. You can use this for support while you get dressed.  Do not have throw rugs and other things on the floor that can make you trip. What can I do in the kitchen?  Clean up any spills right away.  Avoid walking on wet floors.  Keep items that you use a lot in easy-to-reach places.  If you need to reach something above you, use a strong step stool that has a grab bar.  Keep electrical cords out of the way.  Do not use floor polish or wax that makes floors slippery. If you must use wax, use non-skid floor wax.  Do not have throw rugs and other things on the floor that can make you trip. What can I do with my stairs?  Do not leave any items on the stairs.  Make sure that there are handrails on both sides of the stairs and use them. Fix handrails that are broken or loose. Make sure that handrails are as long as the stairways.  Check any carpeting to make sure that it is firmly attached to the stairs. Fix any carpet that is loose or worn.  Avoid having throw rugs at the   or bottom of the stairs. If you do have throw rugs, attach them to the floor with carpet tape.  Make sure that you have a light switch at the top of the stairs and the bottom of the stairs. If you do not have them, ask someone to add them for you. What else can I do to help prevent falls?  Wear shoes that: ? Do not have high heels. ? Have rubber bottoms. ? Are comfortable and fit you well. ? Are closed at the toe. Do not wear sandals.  If you use a stepladder: ? Make sure that it is fully opened. Do not climb a closed stepladder. ? Make sure that both sides of the stepladder are locked into place. ? Ask someone to hold it for you, if possible.  Clearly mark and make sure that you can see: ? Any grab bars or handrails. ? First and last steps. ? Where the edge of each step is.  Use tools that help you  move around (mobility aids) if they are needed. These include: ? Canes. ? Walkers. ? Scooters. ? Crutches.  Turn on the lights when you go into a dark area. Replace any light bulbs as soon as they burn out.  Set up your furniture so you have a clear path. Avoid moving your furniture around.  If any of your floors are uneven, fix them.  If there are any pets around you, be aware of where they are.  Review your medicines with your doctor. Some medicines can make you feel dizzy. This can increase your chance of falling. Ask your doctor what other things that you can do to help prevent falls. This information is not intended to replace advice given to you by your health care provider. Make sure you discuss any questions you have with your health care provider. Document Released: 02/28/2009 Document Revised: 10/10/2015 Document Reviewed: 06/08/2014 Elsevier Interactive Patient Education  2018 [place] Maintenance, Female Adopting a healthy lifestyle and getting preventive care can go a long way to promote health and wellness. Talk with your health care provider about what schedule of regular examinations is right for you. This is a good chance for you to check in with your provider about disease prevention and staying healthy. In between checkups, there are plenty of things you can do on your own. Experts have done a lot of research about which lifestyle changes and preventive measures are most likely to keep you healthy. Ask your health care provider for more information. Weight and diet Eat a healthy diet  Be sure to include plenty of vegetables, fruits, low-fat dairy products, and lean protein.  Do not eat a lot of foods high in solid fats, added sugars, or salt.  Get regular exercise. This is one of the most important things you can do for your health. ? Most adults should exercise for at least 150 minutes each week. The exercise should increase your heart rate and make  you sweat (moderate-intensity exercise). ? Most adults should also do strengthening exercises at least twice a week. This is in addition to the moderate-intensity exercise.  Maintain a healthy weight  Body mass index (BMI) is a measurement that can be used to identify possible weight problems. It estimates body fat based on height and weight. Your health care provider can help determine your BMI and help you achieve or maintain a healthy weight.  For females 90 years of age and older: ? A BMI below 18.5 is  considered underweight. ? A BMI of 18.5 to 24.9 is normal. ? A BMI of 25 to 29.9 is considered overweight. ? A BMI of 30 and above is considered obese.  Watch levels of cholesterol and blood lipids  You should start having your blood tested for lipids and cholesterol at 65 years of age, then have this test every 5 years.  You may need to have your cholesterol levels checked more often if: ? Your lipid or cholesterol levels are high. ? You are older than 65 years of age. ? You are at high risk for heart disease.  Cancer screening Lung Cancer  Lung cancer screening is recommended for adults 32-16 years old who are at high risk for lung cancer because of a history of smoking.  A yearly low-dose CT scan of the lungs is recommended for people who: ? Currently smoke. ? Have quit within the past 15 years. ? Have at least a 30-pack-year history of smoking. A pack year is smoking an average of one pack of cigarettes a day for 1 year.  Yearly screening should continue until it has been 15 years since you quit.  Yearly screening should stop if you develop a health problem that would prevent you from having lung cancer treatment.  Breast Cancer  Practice breast self-awareness. This means understanding how your breasts normally appear and feel.  It also means doing regular breast self-exams. Let your health care provider know about any changes, no matter how small.  If you are in your  20s or 30s, you should have a clinical breast exam (CBE) by a health care provider every 1-3 years as part of a regular health exam.  If you are 25 or older, have a CBE every year. Also consider having a breast X-ray (mammogram) every year.  If you have a family history of breast cancer, talk to your health care provider about genetic screening.  If you are at high risk for breast cancer, talk to your health care provider about having an MRI and a mammogram every year.  Breast cancer gene (BRCA) assessment is recommended for women who have family members with BRCA-related cancers. BRCA-related cancers include: ? Breast. ? Ovarian. ? Tubal. ? Peritoneal cancers.  Results of the assessment will determine the need for genetic counseling and BRCA1 and BRCA2 testing.  Cervical Cancer Your health care provider may recommend that you be screened regularly for cancer of the pelvic organs (ovaries, uterus, and vagina). This screening involves a pelvic examination, including checking for microscopic changes to the surface of your cervix (Pap test). You may be encouraged to have this screening done every 3 years, beginning at age 50.  For women ages 9-65, health care providers may recommend pelvic exams and Pap testing every 3 years, or they may recommend the Pap and pelvic exam, combined with testing for human papilloma virus (HPV), every 5 years. Some types of HPV increase your risk of cervical cancer. Testing for HPV may also be done on women of any age with unclear Pap test results.  Other health care providers may not recommend any screening for nonpregnant women who are considered low risk for pelvic cancer and who do not have symptoms. Ask your health care provider if a screening pelvic exam is right for you.  If you have had past treatment for cervical cancer or a condition that could lead to cancer, you need Pap tests and screening for cancer for at least 20 years after your treatment. If Pap  tests have been discontinued, your risk factors (such as having a new sexual partner) need to be reassessed to determine if screening should resume. Some women have medical problems that increase the chance of getting cervical cancer. In these cases, your health care provider may recommend more frequent screening and Pap tests.  Colorectal Cancer  This type of cancer can be detected and often prevented.  Routine colorectal cancer screening usually begins at 65 years of age and continues through 65 years of age.  Your health care provider may recommend screening at an earlier age if you have risk factors for colon cancer.  Your health care provider may also recommend using home test kits to check for hidden blood in the stool.  A small camera at the end of a tube can be used to examine your colon directly (sigmoidoscopy or colonoscopy). This is done to check for the earliest forms of colorectal cancer.  Routine screening usually begins at age 65.  Direct examination of the colon should be repeated every 5-10 years through 65 years of age. However, you may need to be screened more often if early forms of precancerous polyps or small growths are found.  Skin Cancer  Check your skin from head to toe regularly.  Tell your health care provider about any new moles or changes in moles, especially if there is a change in a mole's shape or color.  Also tell your health care provider if you have a mole that is larger than the size of a pencil eraser.  Always use sunscreen. Apply sunscreen liberally and repeatedly throughout the day.  Protect yourself by wearing long sleeves, pants, a wide-brimmed hat, and sunglasses whenever you are outside.  Heart disease, diabetes, and high blood pressure  High blood pressure causes heart disease and increases the risk of stroke. High blood pressure is more likely to develop in: ? People who have blood pressure in the high end of the normal range  (130-139/85-89 mm Hg). ? People who are overweight or obese. ? People who are African American.  If you are 87-11 years of age, have your blood pressure checked every 3-5 years. If you are 85 years of age or older, have your blood pressure checked every year. You should have your blood pressure measured twice-once when you are at a hospital or clinic, and once when you are not at a hospital or clinic. Record the average of the two measurements. To check your blood pressure when you are not at a hospital or clinic, you can use: ? An automated blood pressure machine at a pharmacy. ? A home blood pressure monitor.  If you are between 74 years and 89 years old, ask your health care provider if you should take aspirin to prevent strokes.  Have regular diabetes screenings. This involves taking a blood sample to check your fasting blood sugar level. ? If you are at a normal weight and have a low risk for diabetes, have this test once every three years after 65 years of age. ? If you are overweight and have a high risk for diabetes, consider being tested at a younger age or more often. Preventing infection Hepatitis B  If you have a higher risk for hepatitis B, you should be screened for this virus. You are considered at high risk for hepatitis B if: ? You were born in a country where hepatitis B is common. Ask your health care provider which countries are considered high risk. ? Your parents were  born in a high-risk country, and you have not been immunized against hepatitis B (hepatitis B vaccine). ? You have HIV or AIDS. ? You use needles to inject street drugs. ? You live with someone who has hepatitis B. ? You have had sex with someone who has hepatitis B. ? You get hemodialysis treatment. ? You take certain medicines for conditions, including cancer, organ transplantation, and autoimmune conditions.  Hepatitis C  Blood testing is recommended for: ? Everyone born from [birth year] through  1964. ? Anyone with known risk factors for hepatitis C.  Sexually transmitted infections (STIs)  You should be screened for sexually transmitted infections (STIs) including gonorrhea and chlamydia if: ? You are sexually active and are younger than 65 years of age. ? You are older than 65 years of age and your health care provider tells you that you are at risk for this type of infection. ? Your sexual activity has changed since you were last screened and you are at an increased risk for chlamydia or gonorrhea. Ask your health care provider if you are at risk.  If you do not have HIV, but are at risk, it may be recommended that you take a prescription medicine daily to prevent HIV infection. This is called pre-exposure prophylaxis (PrEP). You are considered at risk if: ? You are sexually active and do not regularly use condoms or know the HIV status of your partner(s). ? You take drugs by injection. ? You are sexually active with a partner who has HIV.  Talk with your health care provider about whether you are at high risk of being infected with HIV. If you choose to begin PrEP, you should first be tested for HIV. You should then be tested every 3 months for as long as you are taking PrEP. Pregnancy  If you are premenopausal and you may become pregnant, ask your health care provider about preconception counseling.  If you may become pregnant, take 400 to 800 micrograms (mcg) of folic acid every day.  If you want to prevent pregnancy, talk to your health care provider about birth control (contraception). Osteoporosis and menopause  Osteoporosis is a disease in which the bones lose minerals and strength with aging. This can result in serious bone fractures. Your risk for osteoporosis can be identified using a bone density scan.  If you are 77 years of age or older, or if you are at risk for osteoporosis and fractures, ask your health care provider if you should be screened.  Ask your health  care provider whether you should take a calcium or vitamin D supplement to lower your risk for osteoporosis.  Menopause may have certain physical symptoms and risks.  Hormone replacement therapy may reduce some of these symptoms and risks. Talk to your health care provider about whether hormone replacement therapy is right for you. Follow these instructions at home:  Schedule regular health, dental, and eye exams.  Stay current with your immunizations.  Do not use any tobacco products including cigarettes, chewing tobacco, or electronic cigarettes.  If you are pregnant, do not drink alcohol.  If you are breastfeeding, limit how much and how often you drink alcohol.  Limit alcohol intake to no more than 1 drink per day for nonpregnant women. One drink equals 12 ounces of beer, 5 ounces of wine, or 1 ounces of hard liquor.  Do not use street drugs.  Do not share needles.  Ask your health care provider for help if you need support or  information about quitting drugs.  Tell your health care provider if you often feel depressed.  Tell your health care provider if you have ever been abused or do not feel safe at home. This information is not intended to replace advice given to you by your health care provider. Make sure you discuss any questions you have with your health care provider. Document Released: 11/17/2010 Document Revised: 10/10/2015 Document Reviewed: 02/05/2015 Elsevier Interactive Patient Education  [employer].

## 2017-02-24 NOTE — Progress Notes (Signed)
RN AWV note reviewed.   Leeanne Rio, PA-C

## 2017-03-30 DIAGNOSIS — N814 Uterovaginal prolapse, unspecified: Secondary | ICD-10-CM | POA: Diagnosis not present

## 2017-03-30 DIAGNOSIS — Z1289 Encounter for screening for malignant neoplasm of other sites: Secondary | ICD-10-CM | POA: Diagnosis not present

## 2017-03-31 ENCOUNTER — Ambulatory Visit (HOSPITAL_BASED_OUTPATIENT_CLINIC_OR_DEPARTMENT_OTHER)
Admission: RE | Admit: 2017-03-31 | Discharge: 2017-03-31 | Disposition: A | Discharge status: Home / Self Care | Payer: Medicare Other | Source: Ambulatory Visit | Attending: Physician Assistant | Admitting: Physician Assistant

## 2017-03-31 ENCOUNTER — Other Ambulatory Visit: Payer: Self-pay | Admitting: Physician Assistant

## 2017-03-31 DIAGNOSIS — Z1231 Encounter for screening mammogram for malignant neoplasm of breast: Secondary | ICD-10-CM | POA: Insufficient documentation

## 2017-03-31 DIAGNOSIS — E2839 Other primary ovarian failure: Secondary | ICD-10-CM

## 2017-03-31 DIAGNOSIS — M85852 Other specified disorders of bone density and structure, left thigh: Secondary | ICD-10-CM | POA: Diagnosis not present

## 2017-03-31 DIAGNOSIS — Z780 Asymptomatic menopausal state: Secondary | ICD-10-CM | POA: Diagnosis not present

## 2017-03-31 DIAGNOSIS — Z1239 Encounter for other screening for malignant neoplasm of breast: Secondary | ICD-10-CM

## 2017-04-01 ENCOUNTER — Other Ambulatory Visit: Payer: Self-pay | Admitting: Physician Assistant

## 2017-04-01 ENCOUNTER — Encounter: Payer: Self-pay | Admitting: Physician Assistant

## 2017-04-01 DIAGNOSIS — R928 Other abnormal and inconclusive findings on diagnostic imaging of breast: Secondary | ICD-10-CM

## 2017-04-02 ENCOUNTER — Other Ambulatory Visit: Payer: Self-pay

## 2017-04-02 ENCOUNTER — Encounter: Payer: Self-pay | Admitting: Physician Assistant

## 2017-04-02 ENCOUNTER — Ambulatory Visit (INDEPENDENT_AMBULATORY_CARE_PROVIDER_SITE_OTHER): Payer: Medicare Other | Admitting: Physician Assistant

## 2017-04-02 VITALS — BP 118/80 | HR 64 | Temp 97.9°F | Resp 14 | Ht 69.0 in | Wt 260.0 lb

## 2017-04-02 DIAGNOSIS — H9201 Otalgia, right ear: Secondary | ICD-10-CM | POA: Diagnosis not present

## 2017-04-02 DIAGNOSIS — R202 Paresthesia of skin: Secondary | ICD-10-CM | POA: Diagnosis not present

## 2017-04-02 DIAGNOSIS — E559 Vitamin D deficiency, unspecified: Secondary | ICD-10-CM | POA: Diagnosis not present

## 2017-04-02 DIAGNOSIS — R200 Anesthesia of skin: Secondary | ICD-10-CM

## 2017-04-02 DIAGNOSIS — M8580 Other specified disorders of bone density and structure, unspecified site: Secondary | ICD-10-CM

## 2017-04-02 LAB — VITAMIN B12: VITAMIN B 12: 457 pg/mL (ref 211–911)

## 2017-04-02 LAB — VITAMIN D 25 HYDROXY (VIT D DEFICIENCY, FRACTURES): VITD: 28.58 ng/mL — AB (ref 30.00–100.00)

## 2017-04-02 NOTE — Progress Notes (Signed)
Patient presents to clinic today c/o R ear fullness and pain over the past couple of days. States it feels like something squishy is in her ear. Denies fever, chills, drainage from ear or change in hearing. Noted pain when using qtip this morning.   Patient also here for Vitamin D level and B12 level.  Past Medical History:  Diagnosis Date  . Chronic knee pain   . Diverticulitis   . Fibromyalgia   . Obesity   . Osteoarthritis   . Postmenopausal   . Rosacea   . Spinal stenosis of lumbar region     Current Outpatient Medications on File Prior to Visit  Medication Sig Dispense Refill  . AMBULATORY NON FORMULARY MEDICATION Continue Rolfing therapy once weekly or as needed for joint pain and/or stabilizing gait. 1 Package 0  . B Complex-C (B-COMPLEX WITH VITAMIN C) tablet Take 2 tablets by mouth daily.    Marland Kitchen BEE POLLEN PO Take 2 capsules by mouth 2 (two) times daily.    Marland Kitchen CALCIUM-MAGNESIUM-VITAMIN D PO Take 2 capsules by mouth 2 (two) times daily.    Marland Kitchen ELDERBERRY PO Take 2 capsules by mouth 2 (two) times daily.    Marland Kitchen ibuprofen (ADVIL,MOTRIN) 200 MG tablet Take 200 mg by mouth every 8 (eight) hours as needed.    . Misc Natural Products (TURMERIC CURCUMIN) CAPS Take 2 capsules by mouth 2 (two) times daily.    Marland Kitchen neomycin-polymyxin b-dexamethasone (MAXITROL) 3.5-10000-0.1 SUSP Place 1 drop into both eyes as needed.     . NON FORMULARY     . cyclobenzaprine (FLEXERIL) 10 MG tablet Take 1 tablet (10 mg total) by mouth 2 (two) times daily as needed for muscle spasms. (Patient not taking: Reported on 02/24/2017) 90 tablet 1   No current facility-administered medications on file prior to visit.     Allergies  Allergen Reactions  . Meloxicam Other (See Comments)    Per patient "Intestional cramps"    Family History  Problem Relation Age of Onset  . Heart disease Father        MVP  . Colon cancer Neg Hx   . Breast cancer Neg Hx   . Heart attack Neg Hx   . Diabetes Neg Hx      Social History   Socioeconomic History  . Marital status: Widowed    Spouse name: None  . Number of children: 3  . Years of education: None  . Highest education level: None  Social Needs  . Financial resource strain: None  . Food insecurity - worry: None  . Food insecurity - inability: None  . Transportation needs - medical: None  . Transportation needs - non-medical: None  Occupational History  . Occupation: on disability    Employer: UNEMPLOYED  Tobacco Use  . Smoking status: Never Smoker  . Smokeless tobacco: Never Used  Substance and Sexual Activity  . Alcohol use: No  . Drug use: No  . Sexual activity: None  Other Topics Concern  . 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  All other ROS are negative.  BP 118/80   Pulse 64   Temp 97.9 F (36.6  C) (Oral)   Resp 14   Ht 5\' 9"  (1.753 m)   Wt 260 lb (117.9 kg)   SpO2 99%   BMI 38.40 kg/m   Physical Exam  Constitutional: She is oriented to person, place, and time and well-developed, well-nourished, and in no distress.  HENT:  Head: Normocephalic and atraumatic.  Right Ear: External ear normal.  Left Ear: Tympanic membrane, external ear and ear canal normal.  Excessive cerumen noted in R ear canal.  Repeat exam after removal reveals normal TM but shows some irritation of the canal in a small patch where the base of the wax plug was attached to ear canal  Eyes: Conjunctivae are normal.  Neck: Neck supple.  Cardiovascular: Normal rate, regular rhythm, normal heart sounds and intact distal pulses.  Pulmonary/Chest: Effort normal and breath sounds normal. No respiratory distress. She has no wheezes. She has no rales. She exhibits no tenderness.  Neurological: She is alert and oriented to person, place, and time.  Skin: Skin is  warm and dry. No rash noted.  Vitals reviewed.   Recent Results (from the past 2160 hour(s))  POCT urinalysis dipstick     Status: Abnormal   Collection Time: 01/20/17  2:45 PM  Result Value Ref Range   Color, UA yellow    Clarity, UA clear    Glucose, UA negative    Bilirubin, UA negative    Ketones, UA negative    Spec Grav, UA <=1.005 (A) 1.010 - 1.025   Blood, UA trace    pH, UA 6.5 5.0 - 8.0   Protein, UA negative    Urobilinogen, UA 0.2 0.2 or 1.0 E.U./dL   Nitrite, UA negative    Leukocytes, UA Small (1+) (A) Negative  Basic metabolic panel     Status: None   Collection Time: 01/20/17  2:50 PM  Result Value Ref Range   Sodium 137 135 - 145 mEq/L   Potassium 4.3 3.5 - 5.1 mEq/L   Chloride 100 96 - 112 mEq/L   CO2 29 19 - 32 mEq/L   Glucose, Bld 86 70 - 99 mg/dL   BUN 7 6 - 23 mg/dL   Creatinine, Ser 0.54 0.40 - 1.20 mg/dL   Calcium 9.3 8.4 - 10.5 mg/dL   GFR 120.19 >60.00 mL/min  CBC w/Diff     Status: Abnormal   Collection Time: 01/20/17  2:50 PM  Result Value Ref Range   WBC 10.3 4.0 - 10.5 K/uL   RBC 5.01 3.87 - 5.11 Mil/uL   Hemoglobin 14.9 12.0 - 15.0 g/dL   HCT 45.4 36.0 - 46.0 %   MCV 90.6 78.0 - 100.0 fl   MCHC 32.9 30.0 - 36.0 g/dL   RDW 12.9 11.5 - 15.5 %   Platelets 210.0 150.0 - 400.0 K/uL   Neutrophils Relative % 75.0 43.0 - 77.0 %   Lymphocytes Relative 16.8 12.0 - 46.0 %   Monocytes Relative 6.3 3.0 - 12.0 %   Eosinophils Relative 1.6 0.0 - 5.0 %   Basophils Relative 0.3 0.0 - 3.0 %   Neutro Abs 7.8 (H) 1.4 - 7.7 K/uL   Lymphs Abs 1.7 0.7 - 4.0 K/uL   Monocytes Absolute 0.7 0.1 - 1.0 K/uL   Eosinophils Absolute 0.2 0.0 - 0.7 K/uL   Basophils Absolute 0.0 0.0 - 0.1 K/uL  Urine Culture     Status: None   Collection Time: 01/20/17  2:51 PM  Result Value Ref Range   MICRO NUMBER: [id number]  SPECIMEN QUALITY: ADEQUATE    Sample Source NOT GIVEN    STATUS: FINAL    Result: No Growth   CBC w/Diff     Status: Abnormal   Collection Time:  02/08/17 12:40 PM  Result Value Ref Range   WBC 10.3 4.0 - 10.5 K/uL   RBC 5.00 3.87 - 5.11 Mil/uL   Hemoglobin 14.9 12.0 - 15.0 g/dL   HCT 45.5 36.0 - 46.0 %   MCV 91.0 78.0 - 100.0 fl   MCHC 32.8 30.0 - 36.0 g/dL   RDW 12.9 11.5 - 15.5 %   Platelets 195.0 150.0 - 400.0 K/uL   Neutrophils Relative % 79.3 (H) 43.0 - 77.0 %   Lymphocytes Relative 14.2 12.0 - 46.0 %   Monocytes Relative 5.7 3.0 - 12.0 %   Eosinophils Relative 0.5 0.0 - 5.0 %   Basophils Relative 0.3 0.0 - 3.0 %   Neutro Abs 8.2 (H) 1.4 - 7.7 K/uL   Lymphs Abs 1.5 0.7 - 4.0 K/uL   Monocytes Absolute 0.6 0.1 - 1.0 K/uL   Eosinophils Absolute 0.0 0.0 - 0.7 K/uL   Basophils Absolute 0.0 0.0 - 0.1 K/uL  POCT urinalysis dipstick     Status: Normal   Collection Time: 02/08/17 12:54 PM  Result Value Ref Range   Color, UA yellow    Clarity, UA clear    Glucose, UA negative    Bilirubin, UA negative    Ketones, UA negative    Spec Grav, UA 1.020 1.010 - 1.025   Blood, UA negative    pH, UA 7.0 5.0 - 8.0   Protein, UA negative    Urobilinogen, UA 0.2 0.2 or 1.0 E.U./dL   Nitrite, UA negative    Leukocytes, UA Negative Negative    Assessment/Plan: 1. Right ear pain 2/2 Qtip use and cerumen. Removed successfully via irrigation. Discussed avoidance of ctips. Hydrogen peroxide once weekly. Follow-up if there is any recurrence of symptoms.  2. Osteopenia, unspecified location 3. Vitamin D deficiency Vitamin D level today - Vitamin D (25 hydroxy)  4. Numbness and tingling Will check b12 level today. - B12   Leeanne Rio, PA-C

## 2017-04-02 NOTE — Patient Instructions (Signed)
Please go to the lab today for blood work.  I will call you with your results. We will alter treatment regimen(s) if indicated by your results.   No Qtips in the ear. Apply hydrogen peroxide to the ear canal (capful) once weekly to help clean out loose wax.   Can use OTC Flonase to help with any ear pressure.

## 2017-04-02 NOTE — Progress Notes (Signed)
Pre visit review using our clinic review tool, if applicable. No additional management support is needed unless otherwise documented below in the visit note. 

## 2017-04-04 ENCOUNTER — Encounter: Payer: Self-pay | Admitting: Physician Assistant

## 2017-04-13 ENCOUNTER — Ambulatory Visit
Admission: RE | Admit: 2017-04-13 | Discharge: 2017-04-13 | Disposition: A | Discharge status: Home / Self Care | Payer: Medicare Other | Source: Ambulatory Visit | Attending: Physician Assistant | Admitting: Physician Assistant

## 2017-04-13 ENCOUNTER — Encounter: Payer: Self-pay | Admitting: Physician Assistant

## 2017-04-13 ENCOUNTER — Other Ambulatory Visit: Payer: Self-pay | Admitting: Physician Assistant

## 2017-04-13 DIAGNOSIS — R921 Mammographic calcification found on diagnostic imaging of breast: Secondary | ICD-10-CM

## 2017-04-13 DIAGNOSIS — R928 Other abnormal and inconclusive findings on diagnostic imaging of breast: Secondary | ICD-10-CM

## 2017-04-23 ENCOUNTER — Other Ambulatory Visit: Payer: Self-pay | Admitting: Physician Assistant

## 2017-04-23 ENCOUNTER — Ambulatory Visit
Admission: RE | Admit: 2017-04-23 | Discharge: 2017-04-23 | Disposition: A | Discharge status: Home / Self Care | Payer: Medicare Other | Source: Ambulatory Visit | Attending: Physician Assistant | Admitting: Physician Assistant

## 2017-04-23 DIAGNOSIS — R921 Mammographic calcification found on diagnostic imaging of breast: Secondary | ICD-10-CM

## 2017-04-23 DIAGNOSIS — D242 Benign neoplasm of left breast: Secondary | ICD-10-CM | POA: Diagnosis not present

## 2017-05-05 ENCOUNTER — Encounter: Payer: Self-pay | Admitting: Physician Assistant

## 2017-05-05 ENCOUNTER — Ambulatory Visit (INDEPENDENT_AMBULATORY_CARE_PROVIDER_SITE_OTHER): Payer: Medicare Other | Admitting: Physician Assistant

## 2017-05-05 ENCOUNTER — Other Ambulatory Visit: Payer: Self-pay

## 2017-05-05 VITALS — BP 122/82 | HR 64 | Temp 98.0°F | Resp 16 | Ht 69.0 in | Wt 264.2 lb

## 2017-05-05 DIAGNOSIS — R109 Unspecified abdominal pain: Secondary | ICD-10-CM

## 2017-05-05 MED ORDER — CYCLOBENZAPRINE HCL 10 MG PO TABS
10.0000 mg | ORAL_TABLET | Freq: Two times a day (BID) | ORAL | 1 refills | Status: DC | PRN
Start: 2017-05-05 — End: 2017-06-25

## 2017-05-05 NOTE — Patient Instructions (Signed)
Please avoid heavy lifting or overexertion. Continue Motrin, heating pad and add on topical Icy Hot to the area. Symptoms will gradually resolve. Continue Rolfing massages as they seem to be helping.
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
## 2017-05-06 ENCOUNTER — Encounter: Payer: Self-pay | Admitting: Physician Assistant

## 2017-05-24 ENCOUNTER — Encounter: Payer: Self-pay | Admitting: Physician Assistant

## 2017-06-10 ENCOUNTER — Ambulatory Visit (INDEPENDENT_AMBULATORY_CARE_PROVIDER_SITE_OTHER): Payer: Medicare Other

## 2017-06-10 ENCOUNTER — Ambulatory Visit (INDEPENDENT_AMBULATORY_CARE_PROVIDER_SITE_OTHER): Payer: Medicare Other | Admitting: Physician Assistant

## 2017-06-10 ENCOUNTER — Encounter: Payer: Self-pay | Admitting: Physician Assistant

## 2017-06-10 ENCOUNTER — Other Ambulatory Visit: Payer: Self-pay

## 2017-06-10 VITALS — BP 120/82 | HR 67 | Temp 98.1°F | Resp 14 | Ht 69.0 in | Wt 264.0 lb

## 2017-06-10 DIAGNOSIS — M940 Chondrocostal junction syndrome [Tietze]: Secondary | ICD-10-CM | POA: Diagnosis not present

## 2017-06-10 DIAGNOSIS — R0781 Pleurodynia: Secondary | ICD-10-CM

## 2017-06-10 DIAGNOSIS — Z1159 Encounter for screening for other viral diseases: Secondary | ICD-10-CM

## 2017-06-10 DIAGNOSIS — S299XXA Unspecified injury of thorax, initial encounter: Secondary | ICD-10-CM | POA: Diagnosis not present

## 2017-06-10 MED ORDER — CELECOXIB 100 MG PO CAPS: 100.0000 mg | ORAL_CAPSULE | Freq: Two times a day (BID) | ORAL | 0 refills | Status: DC

## 2017-06-10 NOTE — Patient Instructions (Signed)
Please go to the lab today for blood work.  Then speak to Levada Dy or Benjamine Mola regarding directions to the [facility]	[place] for x-ray I will call you with your results. We will alter treatment regimen(s) if indicated by your results.   For now, avoid heavy lifting or overexertion. Start the Celebrex, taking as directed with food. If x-ray negative we will need to start physical therapy.

## 2017-06-10 NOTE — Progress Notes (Signed)
Patient presents to clinic today c/o continued pain in R ribcage after falling into her bed 6 weeks ago. Initially had side pain as well that has completely resolved with rest and rolfing massages. The rib pain is more medial and includes sternum. Is not taking anything for symptoms currently. Denies SOB.   Patient also requesting Hep C screen today. She is overdue for this. Is higher risk due to being born between [place].  Past Medical History:  Diagnosis Date  . Chronic knee pain   . Diverticulitis   . Fibromyalgia   . Obesity   . Osteoarthritis   . Postmenopausal   . Rosacea   . Spinal stenosis of lumbar region     Current Outpatient Medications on File Prior to Visit  Medication Sig Dispense Refill  . AMBULATORY NON FORMULARY MEDICATION Continue Rolfing therapy once weekly or as needed for joint pain and/or stabilizing gait. 1 Package 0  . B Complex-C (B-COMPLEX WITH VITAMIN C) tablet Take 2 tablets by mouth daily.    Marland Kitchen BEE POLLEN PO Take 2 capsules by mouth 2 (two) times daily.    Marland Kitchen CALCIUM-MAGNESIUM-VITAMIN D PO Take 2 capsules by mouth 2 (two) times daily.    Marland Kitchen ELDERBERRY PO Take 2 capsules by mouth 2 (two) times daily.    Marland Kitchen ibuprofen (ADVIL,MOTRIN) 200 MG tablet Take 200 mg by mouth every 8 (eight) hours as needed.    . Misc Natural Products (TURMERIC CURCUMIN) CAPS Take 2 capsules by mouth 2 (two) times daily.    Marland Kitchen neomycin-polymyxin b-dexamethasone (MAXITROL) 3.5-10000-0.1 SUSP Place 1 drop into both eyes as needed.     . NON FORMULARY     . cyclobenzaprine (FLEXERIL) 10 MG tablet Take 1 tablet (10 mg total) by mouth 2 (two) times daily as needed for muscle spasms. (Patient not taking: Reported on 06/10/2017) 30 tablet 1   No current facility-administered medications on file prior to visit.     Allergies  Allergen Reactions  . Meloxicam Other (See Comments)    Per patient "Intestional cramps"    Family History  Problem Relation Age of Onset  . Heart disease  Father        MVP  . Colon cancer Neg Hx   . Breast cancer Neg Hx   . Heart attack Neg Hx   . Diabetes Neg Hx     Social History   Socioeconomic History  . Marital status: Widowed    Spouse name: None  . Number of children: 3  . Years of education: None  . Highest education level: None  Social Needs  . Financial resource strain: None  . Food insecurity - worry: None  . Food insecurity - inability: None  . Transportation needs - medical: None  . Transportation needs - non-medical: None  Occupational History  . Occupation: on disability    Employer: UNEMPLOYED  Tobacco Use  . Smoking status: Never Smoker  . Smokeless tobacco: Never Used  Substance and Sexual Activity  . Alcohol use: No  . Drug use: No  . Sexual activity: None  Other Topics Concern  . 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  All other ROS are negative.  BP 120/82   Pulse 67   Temp 98.1 F (36.7 C) (Oral)   Resp 14   Ht 5\' 9"  (1.753 m)   Wt 264 lb (119.7 kg)   SpO2 99%   BMI 38.99 kg/m   Physical Exam  Constitutional: She is oriented to person, place, and time and well-developed, well-nourished, and in no distress.  HENT:  Head: Normocephalic and atraumatic.  Cardiovascular: Normal rate, regular rhythm, normal heart sounds and intact distal pulses.  Pulmonary/Chest: Effort normal. She exhibits tenderness.    Neurological: She is alert and oriented to person, place, and time.  Psychiatric: Affect normal.  Vitals reviewed.  Recent Results (from the past 2160 hour(s))  B12     Status: None   Collection Time: 04/02/17 12:24 PM  Result Value Ref Range   Vitamin B-12 457 211 - 911 pg/mL  Vitamin D (25 hydroxy)     Status: Abnormal   Collection Time: 04/02/17 12:24 PM  Result Value Ref Range   VITD 28.58  (L) 30.00 - 100.00 ng/mL   Assessment/Plan: 1. Need for hepatitis C screening test Order placed for Hep C Screen. - Hepatitis C Antibody  2. Costochondritis 3. Rib pain on right side X-ray today giving chronicity. No gross bony deformity noted on examination. Start low-dose Celebrex. If x-ray negative will start PT. - DG Ribs Unilateral Right; Future   Leeanne Rio, PA-C

## 2017-06-11 LAB — HEPATITIS C ANTIBODY
Hepatitis C Ab: NONREACTIVE
SIGNAL TO CUT-OFF: 0.06 (ref <1.00)

## 2017-06-25 ENCOUNTER — Encounter: Payer: Self-pay | Admitting: Physician Assistant

## 2017-06-25 ENCOUNTER — Other Ambulatory Visit: Payer: Self-pay

## 2017-06-25 ENCOUNTER — Ambulatory Visit (INDEPENDENT_AMBULATORY_CARE_PROVIDER_SITE_OTHER): Payer: Medicare Other | Admitting: Physician Assistant

## 2017-06-25 VITALS — BP 120/80 | HR 60 | Temp 98.2°F | Resp 16 | Ht 69.0 in | Wt 264.0 lb

## 2017-06-25 DIAGNOSIS — J8410 Pulmonary fibrosis, unspecified: Secondary | ICD-10-CM | POA: Diagnosis not present

## 2017-06-25 NOTE — Patient Instructions (Signed)
Please stay well-hydrated and get plenty of rest.  I am setting you up with Pulmonology for further assessment.  Also please schedule appointment for fasting labs at the [facility] office. I will call with results and next steps.   It was nice seeing you today!

## 2017-06-25 NOTE — Progress Notes (Signed)
Patient presents to clinic today to discuss recent findings on x-ray. Patient had x-ray of chest and R-sided ribs on 06/10/17 at which time we were assessing for a potential rib fracture. CXR revealed no evident rib fracture, areas of scattered scarring and fibrosis. No edema or consolidation. Aortic atherosclerosis noted. Small hiatal hernia. Arthropathy in both shoulders.   Patient without history of chronic cough, hemoptysis, SOBOE or chronic fatigue. Is working hard on diet and trying to stay active to promote weight loss. Has never been on prescription medication for cholesterol. No significant family history of CAD. Father did have history of MVP.  Past Medical History:  Diagnosis Date  . Chronic knee pain   . Diverticulitis   . Fibromyalgia   . Obesity   . Osteoarthritis   . Postmenopausal   . Rosacea   . Spinal stenosis of lumbar region     Current Outpatient Medications on File Prior to Visit  Medication Sig Dispense Refill  . AMBULATORY NON FORMULARY MEDICATION Continue Rolfing therapy once weekly or as needed for joint pain and/or stabilizing gait. 1 Package 0  . B Complex-C (B-COMPLEX WITH VITAMIN C) tablet Take 2 tablets by mouth daily.    Marland Kitchen BEE POLLEN PO Take 2 capsules by mouth 2 (two) times daily.    Marland Kitchen CALCIUM-MAGNESIUM-VITAMIN D PO Take 2 capsules by mouth 2 (two) times daily.    Marland Kitchen ELDERBERRY PO Take 2 capsules by mouth 2 (two) times daily.    Marland Kitchen ibuprofen (ADVIL,MOTRIN) 200 MG tablet Take 200 mg by mouth every 8 (eight) hours as needed.    . Misc Natural Products (TURMERIC CURCUMIN) CAPS Take 2 capsules by mouth 2 (two) times daily.    Marland Kitchen neomycin-polymyxin b-dexamethasone (MAXITROL) 3.5-10000-0.1 SUSP Place 1 drop into both eyes as needed.     . NON FORMULARY     . celecoxib (CELEBREX) 100 MG capsule Take 1 capsule (100 mg total) by mouth 2 (two) times daily. (Patient not taking: Reported on 06/25/2017) 30 capsule 0   No current facility-administered medications on file  prior to visit.     Allergies  Allergen Reactions  . Meloxicam Other (See Comments)    Per patient "Intestional cramps"    Family History  Problem Relation Age of Onset  . Heart disease Father        MVP  . Colon cancer Neg Hx   . Breast cancer Neg Hx   . Heart attack Neg Hx   . Diabetes Neg Hx     Social History   Socioeconomic History  . Marital status: Widowed    Spouse name: None  . Number of children: 3  . Years of education: None  . Highest education level: None  Social Needs  . Financial resource strain: None  . Food insecurity - worry: None  . Food insecurity - inability: None  . Transportation needs - medical: None  . Transportation needs - non-medical: None  Occupational History  . Occupation: on disability    Employer: UNEMPLOYED  Tobacco Use  . Smoking status: Never Smoker  . Smokeless tobacco: Never Used  Substance and Sexual Activity  . Alcohol use: No  . Drug use: No  . Sexual activity: None  Other Topics Concern  . 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  All other ROS are negative.  BP 120/80   Pulse 60   Temp 98.2 F (36.8 C) (Oral)   Resp 16   Ht 5\' 9"  (1.753 m)   Wt 264 lb (119.7 kg)   SpO2 99%   BMI 38.99 kg/m   Physical Exam  Constitutional: She is oriented to person, place, and time and well-developed, well-nourished, and in no distress.  HENT:  Head: Normocephalic and atraumatic.  Eyes: Conjunctivae are normal.  Cardiovascular: Normal rate, regular rhythm, normal heart sounds and intact distal pulses.  Pulmonary/Chest: Effort normal and breath sounds normal. No respiratory distress. She has no wheezes. She has no rales. She exhibits no tenderness.  Neurological: She is alert and oriented to person, place, and time.  Skin: Skin is warm  and dry. No rash noted.  Psychiatric: Affect normal.  Vitals reviewed.  Recent Results (from the past 2160 hour(s))  B12     Status: None   Collection Time: 04/02/17 12:24 PM  Result Value Ref Range   Vitamin B-12 457 211 - 911 pg/mL  Vitamin D (25 hydroxy)     Status: Abnormal   Collection Time: 04/02/17 12:24 PM  Result Value Ref Range   VITD 28.58 (L) 30.00 - 100.00 ng/mL  Hepatitis C Antibody     Status: None   Collection Time: 06/10/17  8:50 AM  Result Value Ref Range   Hepatitis C Ab NON-REACTIVE NON-REACTI   SIGNAL TO CUT-OFF 0.06 <1.00    Assessment/Plan: 1. 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 Exam unremarkable. Referral placed to pulmonology for further assessment.  - Ambulatory referral to Pulmonology  Patient to return for fasting labs to assess cholesterol and liver function due to atherosclerosis noted on imaging.     Leeanne Rio, PA-C

## 2017-06-28 ENCOUNTER — Other Ambulatory Visit (INDEPENDENT_AMBULATORY_CARE_PROVIDER_SITE_OTHER): Payer: Medicare Other

## 2017-06-28 DIAGNOSIS — I700 Atherosclerosis of aorta: Secondary | ICD-10-CM | POA: Diagnosis not present

## 2017-06-28 LAB — LIPID PANEL
Cholesterol: 196 mg/dL (ref 0–200)
HDL: 54 mg/dL (ref >39.00)
LDL Cholesterol: 119 mg/dL — ABNORMAL HIGH (ref 0–99)
NONHDL: 141.57
Total CHOL/HDL Ratio: 4
Triglycerides: 115 mg/dL (ref 0.0–149.0)
VLDL: 23 mg/dL (ref 0.0–40.0)

## 2017-06-28 LAB — COMPREHENSIVE METABOLIC PANEL
ALK PHOS: 83 U/L (ref 39–117)
ALT: 16 U/L (ref 0–35)
AST: 21 U/L (ref 0–37)
Albumin: 3.8 g/dL (ref 3.5–5.2)
BUN: 9 mg/dL (ref 6–23)
CHLORIDE: 102 meq/L (ref 96–112)
CO2: 31 mEq/L (ref 19–32)
Calcium: 9.3 mg/dL (ref 8.4–10.5)
Creatinine, Ser: 0.55 mg/dL (ref 0.40–1.20)
GFR: 117.51 mL/min (ref >60.00)
GLUCOSE: 97 mg/dL (ref 70–99)
Potassium: 4.9 mEq/L (ref 3.5–5.1)
SODIUM: 139 meq/L (ref 135–145)
TOTAL PROTEIN: 6.7 g/dL (ref 6.0–8.3)
Total Bilirubin: 0.6 mg/dL (ref 0.2–1.2)

## 2017-06-29 ENCOUNTER — Encounter: Payer: Self-pay | Admitting: Physician Assistant

## 2017-07-27 ENCOUNTER — Encounter: Payer: Self-pay | Admitting: Physician Assistant

## 2017-08-30 ENCOUNTER — Institutional Professional Consult (permissible substitution): Payer: Medicare Other | Admitting: Pulmonary Disease
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
## 2017-09-15 ENCOUNTER — Ambulatory Visit: Payer: Medicare Other | Admitting: Cardiology

## 2017-09-18 ENCOUNTER — Encounter: Payer: Self-pay | Admitting: Physician Assistant

## 2017-09-23 ENCOUNTER — Institutional Professional Consult (permissible substitution): Payer: Medicare Other | Admitting: Pulmonary Disease

## 2017-10-21 ENCOUNTER — Ambulatory Visit (INDEPENDENT_AMBULATORY_CARE_PROVIDER_SITE_OTHER): Payer: Medicare Other | Admitting: Pulmonary Disease

## 2017-10-21 ENCOUNTER — Encounter: Payer: Self-pay | Admitting: Pulmonary Disease

## 2017-10-21 DIAGNOSIS — J849 Interstitial pulmonary disease, unspecified: Secondary | ICD-10-CM

## 2017-10-21 NOTE — Patient Instructions (Signed)
Abnormal chest x-ray: We will arrange for a high-resolution CT scan of your chest If that test is abnormal then we will have you come back and see [place] Otherwise, we will see you back on an as-needed basis
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
## 2017-10-22 ENCOUNTER — Encounter: Payer: Self-pay | Admitting: Physician Assistant

## 2017-11-10 ENCOUNTER — Encounter: Payer: Self-pay | Admitting: Pulmonary Disease

## 2017-11-10 ENCOUNTER — Telehealth: Payer: Self-pay | Admitting: Pulmonary Disease

## 2017-11-10 NOTE — Telephone Encounter (Signed)
Attempted to call patient today regarding CT scan needing scheduled. rec'd message that pt was called 6 different times, no response.  Rhonda in [place] office tried calling pt's daughter and left VM, no response.  I did not receive an answer at time of call. I have left a voicemail message for pt to return call.  Mailed letter to patient today's mail to contact office.  Routing message to [facility] to follow up with patient CT scan. Routing message to Burman Nieves for review.

## 2017-11-10 NOTE — Telephone Encounter (Signed)
Letter mailed out to patient to contact [facility] to schedule CT Chest.  [name]

## 2017-11-16 ENCOUNTER — Encounter: Payer: Self-pay | Admitting: Physician Assistant

## 2017-11-16 ENCOUNTER — Ambulatory Visit (HOSPITAL_BASED_OUTPATIENT_CLINIC_OR_DEPARTMENT_OTHER)
Admission: RE | Admit: 2017-11-16 | Discharge: 2017-11-16 | Disposition: A | Discharge status: Home / Self Care | Payer: Medicare Other | Source: Ambulatory Visit | Attending: Pulmonary Disease | Admitting: Pulmonary Disease

## 2017-11-16 DIAGNOSIS — I700 Atherosclerosis of aorta: Secondary | ICD-10-CM | POA: Diagnosis not present

## 2017-11-16 DIAGNOSIS — I2510 Atherosclerotic heart disease of native coronary artery without angina pectoris: Secondary | ICD-10-CM | POA: Diagnosis not present

## 2017-11-16 DIAGNOSIS — J849 Interstitial pulmonary disease, unspecified: Secondary | ICD-10-CM | POA: Insufficient documentation

## 2017-11-17 ENCOUNTER — Ambulatory Visit (INDEPENDENT_AMBULATORY_CARE_PROVIDER_SITE_OTHER): Payer: Medicare Other | Admitting: Physician Assistant

## 2017-11-17 ENCOUNTER — Encounter: Payer: Self-pay | Admitting: Physician Assistant

## 2017-11-17 ENCOUNTER — Other Ambulatory Visit: Payer: Self-pay

## 2017-11-17 VITALS — BP 110/60 | HR 74 | Temp 98.6°F | Resp 14 | Ht 68.0 in | Wt 250.0 lb

## 2017-11-17 DIAGNOSIS — K5792 Diverticulitis of intestine, part unspecified, without perforation or abscess without bleeding: Secondary | ICD-10-CM

## 2017-11-17 DIAGNOSIS — I2510 Atherosclerotic heart disease of native coronary artery without angina pectoris: Secondary | ICD-10-CM

## 2017-11-17 MED ORDER — CIPROFLOXACIN HCL 500 MG PO TABS: 500.0000 mg | ORAL_TABLET | Freq: Two times a day (BID) | ORAL | 0 refills | Status: DC

## 2017-11-17 MED ORDER — METRONIDAZOLE 500 MG PO TABS: 500.0000 mg | ORAL_TABLET | Freq: Three times a day (TID) | ORAL | 0 refills | Status: DC

## 2017-11-17 NOTE — Progress Notes (Signed)
Patient with history of sigmoid diverticulosis and diverticulitis presents to clinic today c/o 1.5 weeks of LLQ abdominal cramping and pain along with alternating constipation and loose stools. Denies any fever,  Nausea/vomiting. Has noted anorexia and fatigue. Denies change in diet or activity levels. Has noted increased stressors recently but is not sure if these are related.  Of note, patient had CT chest performed by Pulmonology. Incidentally noted coronary atherosclerosis and calcifications of aortic valve. Patient denies chest pain, palpitations, lightheadedness, dizziness, vision changes or frequent headaches. Patient with history of borderline LDL but refuses medications.   Past Medical History:  Diagnosis Date  . Chronic knee pain   . Diverticulitis   . Fibromyalgia   . Obesity   . Osteoarthritis   . Postmenopausal   . Rosacea   . Spinal stenosis of lumbar region     Current Outpatient Medications on File Prior to Visit  Medication Sig Dispense Refill  . AMBULATORY NON FORMULARY MEDICATION Continue Rolfing therapy once weekly or as needed for joint pain and/or stabilizing gait. 1 Package 0  . B Complex-C (B-COMPLEX WITH VITAMIN C) tablet Take 2 tablets by mouth daily.    Marland Kitchen BEE POLLEN PO Take 2 capsules by mouth 2 (two) times daily.    Marland Kitchen CALCIUM-MAGNESIUM-VITAMIN D PO Take 2 capsules by mouth 2 (two) times daily.    Marland Kitchen ELDERBERRY PO Take 2 capsules by mouth 2 (two) times daily.    Marland Kitchen ibuprofen (ADVIL,MOTRIN) 200 MG tablet Take 200 mg by mouth every 8 (eight) hours as needed.    . Misc Natural Products (TURMERIC CURCUMIN) CAPS Take 2 capsules by mouth 2 (two) times daily.    Marland Kitchen neomycin-polymyxin b-dexamethasone (MAXITROL) 3.5-10000-0.1 SUSP Place 1 drop into both eyes as needed.     . NON FORMULARY      No current facility-administered medications on file prior to visit.     Allergies  Allergen Reactions  . Meloxicam Other (See Comments)    Per patient "Intestional cramps"      Family History  Problem Relation Age of Onset  . Heart disease Father        MVP  . Colon cancer Neg Hx   . Breast cancer Neg Hx   . Heart attack Neg Hx   . Diabetes Neg Hx     Social History   Socioeconomic History  . Marital status: Widowed    Spouse name: Not on file  . Number of children: 3  . Years of education: Not on file  . Highest education level: Not on file  Occupational History  . Occupation: on disability    Employer: UNEMPLOYED  Social Needs  . Financial resource strain: Not on file  . Food insecurity:    Worry: Not on file    Inability: Not on file  . Transportation needs:    Medical: Not on file    Non-medical: Not on file  Tobacco Use  . Smoking status: Never Smoker  . Smokeless tobacco: Never Used  Substance and Sexual Activity  . Alcohol use: No  . Drug use: No  . Sexual activity: Not on file  Lifestyle  . Physical activity:    Days per week: Not on file    Minutes per session: Not on file  . Stress: Not on file  Relationships  . Social connections:    Talks on phone: Not on file    Gets together: Not on file    Attends religious service: Not on file  Active member of club or organization: Not on file    Attends meetings of clubs or organizations: Not on file    Relationship status: Not on file  Other Topics Concern  . 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  All other ROS are negative.  BP 110/60   Pulse 74   Temp 98.6 F (37 C) (Oral)   Resp 14   Ht 5\' 8"  (1.727 m)   Wt 250 lb (113.4 kg)   SpO2 98%   BMI 38.01 kg/m   Physical Exam  Constitutional: She is oriented to person, place, and time. She appears well-developed and well-nourished.  HENT:  Head: Normocephalic and atraumatic.  Cardiovascular: Normal rate and regular  rhythm.  Pulmonary/Chest: Effort normal.  Abdominal: Normal appearance and bowel sounds are normal. She exhibits no distension. There is tenderness in the left lower quadrant. There is no rigidity, no rebound, no guarding and no CVA tenderness.  Neurological: She is alert and oriented to person, place, and time.  Vitals reviewed.  Assessment/Plan: 1. Diverticulitis Mild. Afebrile. + history and classic symptoms. Start Cipro 500 mg BID and Flagyl 500 mg TID x 7 days. Supportive measures and OTC medications reviewed. Follow-up if not improving.   - ciprofloxacin (CIPRO) 500 MG tablet; Take 1 tablet (500 mg total) by mouth 2 (two) times daily.  Dispense: 14 tablet; Refill: 0 - metroNIDAZOLE (FLAGYL) 500 MG tablet; Take 1 tablet (500 mg total) by mouth 3 (three) times daily.  Dispense: 21 tablet; Refill: 0  2. Atherosclerosis of native coronary artery of native heart without angina pectoris Discussed potential need for stress testing and repeat echo. She is followed by Cardiology - Dr. Stanford Breed and is overdue for follow-up. Asymptomatic. Will set her back up with specialist for further discussion.   - Ambulatory referral to Cardiology   Leeanne Rio, PA-C

## 2017-11-17 NOTE — Patient Instructions (Signed)
Please keep well-hydrated and get plenty of rest.  Keep a mostly liquid diet until symptoms start improving.  Can eat small amounts of bland foods.  Take your antibiotics as directed. Tylenol if needed for pain.   You will be contacted by Dr. Jacalyn Lefevre office for further assessment of CT findings and decide on stress testing.

## 2017-11-26 ENCOUNTER — Encounter: Payer: Self-pay | Admitting: Physician Assistant
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
## 2018-01-10 ENCOUNTER — Encounter: Payer: Self-pay | Admitting: Physician Assistant

## 2018-01-12 ENCOUNTER — Encounter: Payer: Self-pay | Admitting: Cardiology

## 2018-01-12 ENCOUNTER — Ambulatory Visit (INDEPENDENT_AMBULATORY_CARE_PROVIDER_SITE_OTHER): Payer: Medicare Other | Admitting: Cardiology

## 2018-01-12 VITALS — BP 112/78 | HR 69 | Ht 68.0 in | Wt 260.1 lb

## 2018-01-12 DIAGNOSIS — I2510 Atherosclerotic heart disease of native coronary artery without angina pectoris: Secondary | ICD-10-CM | POA: Diagnosis not present

## 2018-01-12 DIAGNOSIS — I351 Nonrheumatic aortic (valve) insufficiency: Secondary | ICD-10-CM | POA: Diagnosis not present

## 2018-01-12 DIAGNOSIS — R072 Precordial pain: Secondary | ICD-10-CM

## 2018-01-12 MED ORDER — ASPIRIN EC 81 MG PO TBEC: 81.0000 mg | DELAYED_RELEASE_TABLET | Freq: Every day | ORAL | 3 refills | Status: DC

## 2018-01-12 NOTE — Patient Instructions (Signed)
Medication Instructions:   START ASPIRIN 81 MG ONCE DAILY  Labwork:  Your physician recommends that you return for lab work PRIOR TO EATING  Testing/Procedures:  Your physician has requested that you have an echocardiogram. Echocardiography is a painless test that uses sound waves to create images of your heart. It provides your doctor with information about the size and shape of your heart and how well your heart's chambers and valves are working. This procedure takes approximately one hour. There are no restrictions for this procedure.   Your physician has requested that you have a lexiscan myoview. For further information please visit [email or web address]. Please follow instruction sheet, as given.    Follow-Up:  Your physician wants you to follow-up in: [place] will receive a reminder letter in the mail two months in advance. If you don't receive a letter, please call our office to schedule the follow-up appointment.

## 2018-01-13 ENCOUNTER — Ambulatory Visit (HOSPITAL_BASED_OUTPATIENT_CLINIC_OR_DEPARTMENT_OTHER)
Admission: RE | Admit: 2018-01-13 | Discharge: 2018-01-13 | Disposition: A | Discharge status: Home / Self Care | Payer: Medicare Other | Source: Ambulatory Visit | Attending: Cardiology | Admitting: Cardiology

## 2018-01-13 ENCOUNTER — Encounter: Payer: Self-pay | Admitting: Physician Assistant

## 2018-01-13 DIAGNOSIS — I351 Nonrheumatic aortic (valve) insufficiency: Secondary | ICD-10-CM | POA: Insufficient documentation

## 2018-01-13 DIAGNOSIS — R072 Precordial pain: Secondary | ICD-10-CM | POA: Diagnosis not present

## 2018-01-13 NOTE — Progress Notes (Signed)
  Echocardiogram 2D Echocardiogram has been performed.  [name] 01/13/2018, 10:31 AM

## 2018-01-18 ENCOUNTER — Telehealth (HOSPITAL_COMMUNITY): Payer: Self-pay

## 2018-01-18 NOTE — Telephone Encounter (Signed)
Encounter complete. 

## 2018-01-20 ENCOUNTER — Ambulatory Visit (HOSPITAL_COMMUNITY)
Admission: RE | Admit: 2018-01-20 | Discharge: 2018-01-20 | Disposition: A | Discharge status: Home / Self Care | Payer: Medicare Other | Source: Ambulatory Visit | Attending: Cardiovascular Disease | Admitting: Cardiovascular Disease

## 2018-01-20 DIAGNOSIS — R072 Precordial pain: Secondary | ICD-10-CM | POA: Insufficient documentation

## 2018-01-20 MED ORDER — TECHNETIUM TC 99M TETROFOSMIN IV KIT
25.1000 | PACK | Freq: Once | INTRAVENOUS | Status: AC | PRN
  Administered 2018-01-20: 25.1 via INTRAVENOUS
  Filled 2018-01-20: qty 26

## 2018-01-20 MED ORDER — REGADENOSON 0.4 MG/5ML IV SOLN
0.4000 mg | Freq: Once | INTRAVENOUS | Status: AC
  Administered 2018-01-20: 0.4 mg via INTRAVENOUS

## 2018-01-21 ENCOUNTER — Ambulatory Visit (HOSPITAL_COMMUNITY)
Admission: RE | Admit: 2018-01-21 | Discharge: 2018-01-21 | Disposition: A | Discharge status: Home / Self Care | Payer: Medicare Other | Source: Ambulatory Visit | Attending: Cardiovascular Disease | Admitting: Cardiovascular Disease

## 2018-01-21 LAB — MYOCARDIAL PERFUSION IMAGING
CHL CUP RESTING HR STRESS: 63 {beats}/min
LV dias vol: 106 mL (ref 46–106)
LVSYSVOL: 39 mL
NUC STRESS TID: 1.14
Peak HR: 91 {beats}/min
SDS: 3
SRS: 2
SSS: 5

## 2018-01-21 MED ORDER — TECHNETIUM TC 99M TETROFOSMIN IV KIT
26.4000 | PACK | Freq: Once | INTRAVENOUS | Status: AC | PRN
  Administered 2018-01-21: 26.4 via INTRAVENOUS

## 2018-01-26 ENCOUNTER — Telehealth: Payer: Self-pay | Admitting: Cardiology

## 2018-01-26 NOTE — Telephone Encounter (Signed)
Spoke with pt. Pt aware of echo and nuclear study results with verbal understanding.  Notes recorded by Lelon Perla, MD on 01/21/2018 at 2:31 PM EDT Normal stress nuclear study Kirk Ruths   Notes recorded by Lelon Perla, MD on 01/13/2018 at 2:13 PM EDT Normal LV function; mild AS and AI Kirk Ruths

## 2018-01-26 NOTE — Telephone Encounter (Signed)
Please call patient with results.

## 2018-01-27 DIAGNOSIS — H5213 Myopia, bilateral: Secondary | ICD-10-CM | POA: Diagnosis not present

## 2018-01-27 DIAGNOSIS — D3131 Benign neoplasm of right choroid: Secondary | ICD-10-CM | POA: Diagnosis not present

## 2018-01-27 DIAGNOSIS — H524 Presbyopia: Secondary | ICD-10-CM | POA: Diagnosis not present

## 2018-01-27 DIAGNOSIS — H04123 Dry eye syndrome of bilateral lacrimal glands: Secondary | ICD-10-CM | POA: Diagnosis not present

## 2018-01-27 DIAGNOSIS — H2513 Age-related nuclear cataract, bilateral: Secondary | ICD-10-CM | POA: Diagnosis not present

## 2018-01-27 DIAGNOSIS — H52223 Regular astigmatism, bilateral: Secondary | ICD-10-CM | POA: Diagnosis not present

## 2018-01-27 DIAGNOSIS — H01001 Unspecified blepharitis right upper eyelid: Secondary | ICD-10-CM | POA: Diagnosis not present

## 2018-02-24 ENCOUNTER — Encounter: Payer: Self-pay | Admitting: *Deleted

## 2018-04-06 ENCOUNTER — Other Ambulatory Visit: Payer: Self-pay

## 2018-05-08 ENCOUNTER — Encounter: Payer: Self-pay | Admitting: Physician Assistant

## 2018-05-24 DIAGNOSIS — M71341 Other bursal cyst, right hand: Secondary | ICD-10-CM | POA: Diagnosis not present

## 2018-05-24 DIAGNOSIS — L578 Other skin changes due to chronic exposure to nonionizing radiation: Secondary | ICD-10-CM | POA: Diagnosis not present

## 2018-05-24 DIAGNOSIS — L905 Scar conditions and fibrosis of skin: Secondary | ICD-10-CM | POA: Diagnosis not present

## 2018-05-24 DIAGNOSIS — L570 Actinic keratosis: Secondary | ICD-10-CM | POA: Diagnosis not present

## 2018-05-25 ENCOUNTER — Encounter: Payer: Self-pay | Admitting: Physician Assistant

## 2018-11-09 ENCOUNTER — Telehealth: Payer: Self-pay | Admitting: *Deleted

## 2018-11-09 NOTE — Telephone Encounter (Signed)
A message was left, re: follow up visit. 

## 2019-01-21 ENCOUNTER — Encounter: Payer: Self-pay | Admitting: Physician Assistant

## 2019-02-13 ENCOUNTER — Encounter: Payer: Self-pay | Admitting: Physician Assistant

## 2019-04-14 ENCOUNTER — Encounter: Payer: Self-pay | Admitting: Physician Assistant

## 2019-04-14 DIAGNOSIS — Z832 Family history of diseases of the blood and blood-forming organs and certain disorders involving the immune mechanism: Secondary | ICD-10-CM

## 2019-04-14 DIAGNOSIS — Z20828 Contact with and (suspected) exposure to other viral communicable diseases: Secondary | ICD-10-CM | POA: Diagnosis not present

## 2019-04-14 DIAGNOSIS — Z1159 Encounter for screening for other viral diseases: Secondary | ICD-10-CM | POA: Diagnosis not present

## 2019-04-19 ENCOUNTER — Other Ambulatory Visit (HOSPITAL_BASED_OUTPATIENT_CLINIC_OR_DEPARTMENT_OTHER): Payer: Self-pay | Admitting: Physician Assistant

## 2019-04-19 DIAGNOSIS — Z1231 Encounter for screening mammogram for malignant neoplasm of breast: Secondary | ICD-10-CM

## 2019-04-20 ENCOUNTER — Other Ambulatory Visit: Payer: Self-pay

## 2019-04-20 ENCOUNTER — Ambulatory Visit (HOSPITAL_BASED_OUTPATIENT_CLINIC_OR_DEPARTMENT_OTHER)
Admission: RE | Admit: 2019-04-20 | Discharge: 2019-04-20 | Disposition: A | Discharge status: Home / Self Care | Payer: Medicare Other | Source: Ambulatory Visit | Attending: Physician Assistant | Admitting: Physician Assistant

## 2019-04-20 DIAGNOSIS — Z1231 Encounter for screening mammogram for malignant neoplasm of breast: Secondary | ICD-10-CM | POA: Diagnosis not present

## 2019-04-21 ENCOUNTER — Encounter: Payer: Self-pay | Admitting: Cardiology

## 2019-04-21 ENCOUNTER — Encounter: Payer: Self-pay | Admitting: Emergency Medicine

## 2019-04-21 ENCOUNTER — Ambulatory Visit (INDEPENDENT_AMBULATORY_CARE_PROVIDER_SITE_OTHER): Payer: Medicare Other | Admitting: Cardiology

## 2019-04-21 VITALS — BP 138/80 | HR 70 | Temp 97.2°F | Ht 68.0 in | Wt 272.0 lb

## 2019-04-21 DIAGNOSIS — I350 Nonrheumatic aortic (valve) stenosis: Secondary | ICD-10-CM

## 2019-04-21 DIAGNOSIS — Z8249 Family history of ischemic heart disease and other diseases of the circulatory system: Secondary | ICD-10-CM
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
## 2019-04-21 NOTE — Assessment & Plan Note (Signed)
BMI 41 

## 2019-04-21 NOTE — Patient Instructions (Addendum)
Medication Instructions:  Your physician recommends that you continue on your current medications as directed. Please refer to the Current Medication list given to you today. *If you need a refill on your cardiac medications before your next appointment, please call your pharmacy*  Lab Work: None  If you have labs (blood work) drawn today and your tests are completely normal, you will receive your results only by: Marland Kitchen MyChart Message (if you have MyChart) OR . A paper copy in the mail If you have any lab test that is abnormal or we need to change your treatment, we will call you to review the results.  Testing/Procedures: Your physician has requested that you have an echocardiogram. Echocardiography is a painless test that uses sound waves to create images of your heart. It provides your doctor with information about the size and shape of your heart and how well your heart's chambers and valves are working. This procedure takes approximately one hour. There are no restrictions for this procedure. TEST WILL BE COMPLETED AT [address]  Follow-Up: At [facility], you and your health needs are our priority.  As part of our continuing mission to provide you with exceptional heart care, we have created designated Provider Care Teams.  These Care Teams include your primary Cardiologist (physician) and Advanced Practice Providers (APPs -  Physician Assistants and Nurse Practitioners) who all work together to provide you with the care you need, when you need it.  Your next appointment:   12 month(s)  The format for your next appointment:   Virtual Visit   Provider:   Kirk Ruths, MD  Other Instructions

## 2019-04-21 NOTE — Assessment & Plan Note (Signed)
Pt's identical twin sister apparently had an unprovoked pulmonary embolism and an embolism to her Rt arm in Nov 2020.  She required lytic therapy for her PE and embolectomy for her Rt arm.  She was also found to have a (?) VSD which was closed with a device ([facility]).

## 2019-04-21 NOTE — Assessment & Plan Note (Signed)
Echo Aug 2018

## 2019-04-25 ENCOUNTER — Encounter (HOSPITAL_COMMUNITY): Payer: Self-pay | Admitting: Cardiology

## 2019-05-02 ENCOUNTER — Inpatient Hospital Stay: Payer: Medicare Other

## 2019-05-02 ENCOUNTER — Other Ambulatory Visit (HOSPITAL_COMMUNITY): Payer: Medicare Other

## 2019-05-02 ENCOUNTER — Ambulatory Visit: Payer: Medicare Other | Admitting: Hematology

## 2019-05-09 ENCOUNTER — Inpatient Hospital Stay: Payer: Medicare Other | Attending: Hematology & Oncology

## 2019-05-09 ENCOUNTER — Other Ambulatory Visit: Payer: Self-pay

## 2019-05-09 ENCOUNTER — Inpatient Hospital Stay (HOSPITAL_BASED_OUTPATIENT_CLINIC_OR_DEPARTMENT_OTHER): Payer: Medicare Other | Admitting: Hematology & Oncology

## 2019-05-09 ENCOUNTER — Encounter: Payer: Self-pay | Admitting: Hematology & Oncology

## 2019-05-09 VITALS — BP 152/87 | HR 65 | Temp 96.9°F | Resp 18 | Ht 68.0 in | Wt 278.0 lb

## 2019-05-09 DIAGNOSIS — Z791 Long term (current) use of non-steroidal anti-inflammatories (NSAID): Secondary | ICD-10-CM | POA: Diagnosis not present

## 2019-05-09 DIAGNOSIS — Z8249 Family history of ischemic heart disease and other diseases of the circulatory system: Secondary | ICD-10-CM | POA: Diagnosis not present

## 2019-05-09 DIAGNOSIS — E669 Obesity, unspecified: Secondary | ICD-10-CM | POA: Insufficient documentation

## 2019-05-09 DIAGNOSIS — M797 Fibromyalgia: Secondary | ICD-10-CM

## 2019-05-09 DIAGNOSIS — Z7982 Long term (current) use of aspirin: Secondary | ICD-10-CM | POA: Diagnosis not present

## 2019-05-09 DIAGNOSIS — Z130 Encounter for screening for diseases of the blood and blood-forming organs and certain disorders involving the immune mechanism: Secondary | ICD-10-CM | POA: Diagnosis not present

## 2019-05-09 DIAGNOSIS — M1990 Unspecified osteoarthritis, unspecified site: Secondary | ICD-10-CM | POA: Diagnosis not present

## 2019-05-09 LAB — CBC WITH DIFFERENTIAL (CANCER CENTER ONLY)
Abs Immature Granulocytes: 0.01 10*3/uL (ref 0.00–0.07)
Basophils Absolute: 0 10*3/uL (ref 0.0–0.1)
Basophils Relative: 1 %
Eosinophils Absolute: 0.1 10*3/uL (ref 0.0–0.5)
Eosinophils Relative: 2 %
HCT: 45.3 % (ref 36.0–46.0)
Hemoglobin: 14.9 g/dL (ref 12.0–15.0)
Immature Granulocytes: 0 %
Lymphocytes Relative: 37 %
Lymphs Abs: 2.4 10*3/uL (ref 0.7–4.0)
MCH: 29.8 pg (ref 26.0–34.0)
MCHC: 32.9 g/dL (ref 30.0–36.0)
MCV: 90.6 fL (ref 80.0–100.0)
Monocytes Absolute: 0.4 10*3/uL (ref 0.1–1.0)
Monocytes Relative: 7 %
Neutro Abs: 3.4 10*3/uL (ref 1.7–7.7)
Neutrophils Relative %: 53 %
Platelet Count: 219 10*3/uL (ref 150–400)
RBC: 5 MIL/uL (ref 3.87–5.11)
RDW: 12.6 % (ref 11.5–15.5)
WBC Count: 6.4 10*3/uL (ref 4.0–10.5)
nRBC: 0 % (ref 0.0–0.2)

## 2019-05-09 LAB — CMP (CANCER CENTER ONLY)
ALT: 11 U/L (ref 0–44)
AST: 18 U/L (ref 15–41)
Albumin: 4.1 g/dL (ref 3.5–5.0)
Alkaline Phosphatase: 84 U/L (ref 38–126)
Anion gap: 7 (ref 5–15)
BUN: 15 mg/dL (ref 8–23)
CO2: 28 mmol/L (ref 22–32)
Calcium: 9.3 mg/dL (ref 8.9–10.3)
Chloride: 102 mmol/L (ref 98–111)
Creatinine: 0.6 mg/dL (ref 0.44–1.00)
GFR, Est AFR Am: >60 mL/min (ref >60)
GFR, Estimated: >60 mL/min (ref >60)
Glucose, Bld: 87 mg/dL (ref 70–99)
Potassium: 4.3 mmol/L (ref 3.5–5.1)
Sodium: 137 mmol/L (ref 135–145)
Total Bilirubin: 0.5 mg/dL (ref 0.3–1.2)
Total Protein: 7.1 g/dL (ref 6.5–8.1)

## 2019-05-09 LAB — ANTITHROMBIN III: AntiThromb III Func: 102 % (ref 75–120)

## 2019-05-09 LAB — SAVE SMEAR(SSMR), FOR PROVIDER SLIDE REVIEW
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
## 2019-05-10 ENCOUNTER — Telehealth: Payer: Self-pay | Admitting: Hematology & Oncology

## 2019-05-10 LAB — CARDIOLIPIN ANTIBODIES, IGG, IGM, IGA
Anticardiolipin IgA: <9 APL U/mL (ref 0–11)
Anticardiolipin IgG: <9 GPL U/mL (ref 0–14)
Anticardiolipin IgM: <9 MPL U/mL (ref 0–12)

## 2019-05-10 LAB — HOMOCYSTEINE: Homocysteine: 14 umol/L (ref 0.0–17.2)

## 2019-05-10 LAB — BETA-2-GLYCOPROTEIN I ABS, IGG/M/A
Beta-2 Glyco I IgG: <9 GPI IgG units (ref 0–20)
Beta-2-Glycoprotein I IgA: <9 GPI IgA units (ref 0–25)
Beta-2-Glycoprotein I IgM: <9 GPI IgM units (ref 0–32)

## 2019-05-10 LAB — LUPUS ANTICOAGULANT PANEL
DRVVT: 29.9 s (ref 0.0–47.0)
PTT Lupus Anticoagulant: 33.2 s (ref 0.0–51.9)

## 2019-05-10 LAB — PROTEIN S ACTIVITY: Protein S Activity: 102 % (ref 63–140)

## 2019-05-10 LAB — PROTEIN C ACTIVITY: Protein C Activity: 112 % (ref 73–180)

## 2019-05-10 LAB — PROTEIN S, TOTAL: Protein S Ag, Total: 87 % (ref 60–150)

## 2019-05-10 NOTE — Telephone Encounter (Signed)
No LOS 12/22

## 2019-05-11 ENCOUNTER — Other Ambulatory Visit (HOSPITAL_COMMUNITY): Payer: Medicare Other

## 2019-05-11 ENCOUNTER — Telehealth: Payer: Self-pay | Admitting: *Deleted

## 2019-05-11 LAB — PROTEIN C, TOTAL: Protein C, Total: 84 % (ref 60–150)

## 2019-05-11 NOTE — Telephone Encounter (Signed)
-----   Message from Volanda Napoleon, MD sent at 05/11/2019  7:17 AM EST ----- Call - all of the blood clotting studies are normal!!!  You do NOT have what your sister has!!!  Merry Christmas!!! Laurey Arrow

## 2019-05-11 NOTE — Telephone Encounter (Signed)
As noted below by Dr. Marin Olp, I informed the patient that all of the blood clotting studies are normal. You do NOT have what your sister has. She verbalized understanding.

## 2019-05-15 ENCOUNTER — Other Ambulatory Visit: Payer: Self-pay

## 2019-05-15 ENCOUNTER — Ambulatory Visit (HOSPITAL_BASED_OUTPATIENT_CLINIC_OR_DEPARTMENT_OTHER)
Admission: RE | Admit: 2019-05-15 | Discharge: 2019-05-15 | Disposition: A | Discharge status: Home / Self Care | Payer: Medicare Other | Source: Ambulatory Visit | Attending: Cardiology | Admitting: Cardiology

## 2019-05-15 DIAGNOSIS — I350 Nonrheumatic aortic (valve) stenosis: Secondary | ICD-10-CM | POA: Insufficient documentation

## 2019-05-15 LAB — FACTOR 5 LEIDEN

## 2019-05-16 ENCOUNTER — Telehealth: Payer: Self-pay | Admitting: *Deleted

## 2019-05-16 ENCOUNTER — Telehealth: Payer: Self-pay

## 2019-05-16 LAB — PROTHROMBIN GENE MUTATION

## 2019-05-16 NOTE — Telephone Encounter (Addendum)
Called and left a voice message for the patient to give our office a call back for her results of her Echo.  ----- Message from Erlene Quan, PA-C sent at 05/15/2019  1:22 PM EST ----- Please let the patient know her echo looked good, mild narrowing of her aortic valve, no evidence of any heart defects like her twin sister had.  She should see Dr Stanford Breed in 6 months, and let her know I'll forward him a copy of the echo to Dr Stanford Breed as well

## 2019-05-16 NOTE — Telephone Encounter (Addendum)
-----   Message from Volanda Napoleon, MD sent at 05/16/2019 12:56 PM EST ----- Called patient to let her and tell her that all of her clotting studies are normal.  She does not have anything that her sister has.  Thank you.

## 2019-07-14 ENCOUNTER — Ambulatory Visit (INDEPENDENT_AMBULATORY_CARE_PROVIDER_SITE_OTHER): Payer: Medicare Other | Admitting: Physician Assistant

## 2019-07-14 ENCOUNTER — Encounter: Payer: Self-pay | Admitting: Physician Assistant

## 2019-07-14 ENCOUNTER — Other Ambulatory Visit: Payer: Self-pay

## 2019-07-14 VITALS — BP 120/76 | HR 88 | Temp 97.6°F

## 2019-07-14 DIAGNOSIS — K5732 Diverticulitis of large intestine without perforation or abscess without bleeding: Secondary | ICD-10-CM | POA: Diagnosis not present

## 2019-07-14 MED ORDER — METRONIDAZOLE 500 MG PO TABS: 500.0000 mg | ORAL_TABLET | Freq: Three times a day (TID) | ORAL | 0 refills | Status: DC

## 2019-07-14 MED ORDER — CIPROFLOXACIN HCL 500 MG PO TABS: 500.0000 mg | ORAL_TABLET | Freq: Two times a day (BID) | ORAL | 0 refills | Status: DC

## 2019-07-14 NOTE — Progress Notes (Signed)
Virtual Visit via Video   I connected with patient on 07/14/19 at 11:00 AM EST by a video enabled telemedicine application and verified that I am speaking with the correct person using two identifiers.  Location patient: Home Location provider: [facility], Office Persons participating in the virtual visit: Patient, Provider, [place] (Patina Moore)  I discussed the limitations of evaluation and management by telemedicine and the availability of in person appointments. The patient expressed understanding and agreed to proceed.  Subjective:   HPI:   Patient presents via [facility] complaining of 4 days of left lower quadrant discomfort associated with some abdominal cramping and fluctuations in her bowel habits.  Denies fever but notes some mild chills.  Has been very busy recently and has not been hydrating as well.  Denies any change to urinary habits.  Denies nausea or vomiting.  Denies heartburn.  Denies any melena, hematochezia or tenesmus.  Has history of diverticulitis and notes this feels identical to prior episodes.  Has switched herself to a liquid diet over the past 48 hours which seems to be helping.  ROS:   See pertinent positives and negatives per HPI.  Patient Active Problem List   Diagnosis Date Noted  . Aortic stenosis, mild 04/21/2019  . Family history of pulmonary embolism 04/21/2019  . Murmur 10/09/2015  . Bruit 10/09/2015  . AC (acromioclavicular) arthritis 07/09/2014  . Disorder of rotator cuff 07/09/2014  . Palpitations and heart murmur  10/24/2013  . Diverticulitis of colon 01/06/2013  . Annual physical exam 09/07/2011  . Osteopenia 07/11/2010  . VITAMIN D DEFICIENCY 05/05/2010  . Morbid obesity ([place]) 05/05/2010  . Osteoarthritis-pain management 11/09/2007  . POSTMENOPAUSAL STATUS 03/16/2007  . History of fibromyalgia 03/16/2007    Social History   Tobacco Use  . Smoking status: Never Smoker  . Smokeless tobacco: Never Used  Substance Use Topics    . Alcohol use: No    Current Outpatient Medications:  .  AMBULATORY NON FORMULARY MEDICATION, Continue Rolfing therapy once weekly or as needed for joint pain and/or stabilizing gait., Disp: 1 Package, Rfl: 0 .  aspirin EC 81 MG tablet, Take 1 tablet (81 mg total) by mouth daily., Disp: 90 tablet, Rfl: 3 .  B Complex-C (B-COMPLEX WITH VITAMIN C) tablet, Take 2 tablets by mouth daily., Disp: , Rfl:  .  BEE POLLEN PO, Take 2 capsules by mouth 2 (two) times daily., Disp: , Rfl:  .  CALCIUM-MAGNESIUM-VITAMIN D PO, Take 2 capsules by mouth 2 (two) times daily., Disp: , Rfl:  .  ELDERBERRY PO, Take 2 capsules by mouth 2 (two) times daily., Disp: , Rfl:  .  ibuprofen (ADVIL,MOTRIN) 200 MG tablet, Take 200 mg by mouth every 8 (eight) hours as needed., Disp: , Rfl:  .  Misc Natural Products (TURMERIC CURCUMIN) CAPS, Take 2 capsules by mouth 2 (two) times daily., Disp: , Rfl:  .  neomycin-polymyxin b-dexamethasone (MAXITROL) 3.5-10000-0.1 SUSP, Place 1 drop into both eyes as needed. , Disp: , Rfl:  .  NON FORMULARY, , Disp: , Rfl:   Allergies  Allergen Reactions  . Meloxicam Other (See Comments)    Per patient "Intestional cramps"    Objective:   BP 120/76   Pulse 88   Temp 97.6 F (36.4 C) (Temporal)   SpO2 95%   Patient is well-developed, well-nourished in no acute distress.  Resting comfortably  at home.  Head is normocephalic, atraumatic.  No labored breathing.  Speech is clear and coherent with logical content.  Patient is alert and oriented at baseline.   Assessment and Plan:   1. Diverticulitis of colon Afebrile.  No alarm signs or symptoms present.  Identical to prior episodes per patient.  We will have her continue liquid diet.  Rx Cipro and Flagyl for 7-day courses.  Supportive measures and OTC medications reviewed.  Strict ER precautions discussed with patient who voiced understanding and agreement with the plan.    Leeanne Rio, [place] 07/14/2019

## 2019-07-14 NOTE — Patient Instructions (Signed)
Instructions sent to MyChart.  Please keep well-hydrated and get plenty of rest. Avoid solid foods until symptoms start improving. Tylenol if needed for pain.  Take antibiotics as directed.  If you note any worsening symptoms or if you develop fever, vomiting or blood in stool, you need to be seen ASAP as this would warrant need for CT and more emergent assessment.   Diverticulitis  Diverticulitis is when small pockets in your large intestine (colon) get infected or swollen. This causes stomach pain and watery poop (diarrhea). These pouches are called diverticula. They form in people who have a condition called diverticulosis. Follow these instructions at home: Medicines  Take over-the-counter and prescription medicines only as told by your doctor. These include: ? Antibiotics. ? Pain medicines. ? Fiber pills. ? Probiotics. ? Stool softeners.  Do not drive or use heavy machinery while taking prescription pain medicine.  If you were prescribed an antibiotic, take it as told. Do not stop taking it even if you feel better. General instructions   Follow a diet as told by your doctor.  When you feel better, your doctor may tell you to change your diet. You may need to eat a lot of fiber. Fiber makes it easier to poop (have bowel movements). Healthy foods with fiber include: ? Berries. ? Beans. ? Lentils. ? Green vegetables.  Exercise 3 or more times a week. Aim for 30 minutes each time. Exercise enough to sweat and make your heart beat faster.  Keep all follow-up visits as told. This is important. You may need to have an exam of the large intestine. This is called a colonoscopy. Contact a doctor if:  Your pain does not get better.  You have a hard time eating or drinking.  You are not pooping like normal. Get help right away if:  Your pain gets worse.  Your problems do not get better.  Your problems get worse very fast.  You have a fever.  You throw up (vomit)  more than one time.  You have poop that is: ? Bloody. ? Black. ? Tarry. Summary  Diverticulitis is when small pockets in your large intestine (colon) get infected or swollen.  Take medicines only as told by your doctor.  Follow a diet as told by your doctor. This information is not intended to replace advice given to you by your health care provider. Make sure you discuss any questions you have with your health care provider. Document Revised: 04/16/2017 Document Reviewed: 05/21/2016 Elsevier Patient Education  [place].

## 2019-07-14 NOTE — Progress Notes (Signed)
I have discussed the procedure for the virtual visit with the patient who has given consent to proceed with assessment and treatment.   Daziah Hesler S Sui Kasparek, CMA     

## 2019-07-18 ENCOUNTER — Encounter: Payer: Self-pay | Admitting: Physician Assistant

## 2019-07-19 ENCOUNTER — Telehealth: Payer: Self-pay

## 2019-07-19 NOTE — Telephone Encounter (Signed)
Message sent via MyChart.

## 2019-07-19 NOTE — Telephone Encounter (Signed)
Patient's sister called in stating that patient swallowed her FLAGYL and that medication started to dissolve in her throat causing patient to begin gagging and ended with patient vomiting. Patient is worried that she has aspirated and c/o burning in her throat. Patient's sister stated that patient has one last dose of FLAGYL and wanting to know if they can crush the medication into applesauce. Informed patient and sister to call pharmacy that medication was picked up from to advise if medication can be crushed. Informed patient that I would route message to PCP. Please advise

## 2019-07-19 NOTE — Telephone Encounter (Signed)
There is irritation from the pill getting caught and then the acid spilling into the throat with retching. Keep hydrated. Consider some Pepto Bismol or OTC Famotidine to help calm this down. Keep a bland diet.  Monitor for any new onset cough or chest congestion, fever etc. Keep up and moving around when able and take full, deep breaths and this would help to hopefully prevent infection from developing. Notify me of any new symptoms if they develop.

## 2019-07-25 ENCOUNTER — Encounter: Payer: Self-pay | Admitting: Physician Assistant

## 2019-07-28 ENCOUNTER — Encounter: Payer: Self-pay | Admitting: Physician Assistant

## 2019-08-02 ENCOUNTER — Other Ambulatory Visit: Payer: Self-pay

## 2019-08-02 ENCOUNTER — Encounter: Payer: Self-pay | Admitting: Physician Assistant

## 2019-08-02 ENCOUNTER — Telehealth (INDEPENDENT_AMBULATORY_CARE_PROVIDER_SITE_OTHER): Payer: Medicare Other | Admitting: Physician Assistant

## 2019-08-02 VITALS — BP 109/67 | HR 73

## 2019-08-02 DIAGNOSIS — J019 Acute sinusitis, unspecified: Secondary | ICD-10-CM

## 2019-08-02 DIAGNOSIS — B9689 Other specified bacterial agents as the cause of diseases classified elsewhere: Secondary | ICD-10-CM | POA: Diagnosis not present

## 2019-08-02 MED ORDER — DOXYCYCLINE HYCLATE 100 MG PO CAPS: 100.0000 mg | ORAL_CAPSULE | Freq: Two times a day (BID) | ORAL | 0 refills | Status: DC

## 2019-08-02 NOTE — Progress Notes (Signed)
Virtual Visit via Video   I connected with patient on 08/02/19 at 10:00 AM EDT by a video enabled telemedicine application and verified that I am speaking with the correct person using two identifiers.  Location patient: Home Location provider: [facility], Office Persons participating in the virtual visit: Patient, Provider, Karen (Patina Moore)  I discussed the limitations of evaluation and management by telemedicine and the availability of in person appointments. The patient expressed understanding and agreed to proceed.  Subjective:   HPI:   Patient presents via [place] today to discuss ongoing symptoms after diagnosis of COVID on 07/24/19. Notes she was doing much better and was feeling back to her normal but this weekend she developed low-grade fever, worse at night with thick yellow-green nasal discharge, sinus pressure and pain. Denies chest congestion or significant cough. Denies SOB, chest pain. Has not taken anything for symptoms.   ROS:   See pertinent positives and negatives per HPI.  Patient Active Problem List   Diagnosis Date Noted  . Aortic stenosis, mild 04/21/2019  . Family history of pulmonary embolism 04/21/2019  . Murmur 10/09/2015  . Bruit 10/09/2015  . AC (acromioclavicular) arthritis 07/09/2014  . Disorder of rotator cuff 07/09/2014  . Palpitations and heart murmur  10/24/2013  . Diverticulitis of colon 01/06/2013  . Annual physical exam 09/07/2011  . Osteopenia 07/11/2010  . VITAMIN D DEFICIENCY 05/05/2010  . Morbid obesity ([place]) 05/05/2010  . Osteoarthritis-pain management 11/09/2007  . POSTMENOPAUSAL STATUS 03/16/2007  . History of fibromyalgia 03/16/2007    Social History   Tobacco Use  . Smoking status: Never Smoker  . Smokeless tobacco: Never Used  Substance Use Topics  . Alcohol use: No    Current Outpatient Medications:  .  AMBULATORY NON FORMULARY MEDICATION, Continue Rolfing therapy once weekly or as needed for joint pain  and/or stabilizing gait., Disp: 1 Package, Rfl: 0 .  aspirin EC 81 MG tablet, Take 1 tablet (81 mg total) by mouth daily., Disp: 90 tablet, Rfl: 3 .  B Complex-C (B-COMPLEX WITH VITAMIN C) tablet, Take 2 tablets by mouth daily., Disp: , Rfl:  .  BEE POLLEN PO, Take 2 capsules by mouth 2 (two) times daily., Disp: , Rfl:  .  CALCIUM-MAGNESIUM-VITAMIN D PO, Take 2 capsules by mouth 2 (two) times daily., Disp: , Rfl:  .  ELDERBERRY PO, Take 2 capsules by mouth 2 (two) times daily., Disp: , Rfl:  .  ibuprofen (ADVIL,MOTRIN) 200 MG tablet, Take 200 mg by mouth every 8 (eight) hours as needed., Disp: , Rfl:  .  Misc Natural Products (TURMERIC CURCUMIN) CAPS, Take 2 capsules by mouth 2 (two) times daily., Disp: , Rfl:  .  neomycin-polymyxin b-dexamethasone (MAXITROL) 3.5-10000-0.1 SUSP, Place 1 drop into both eyes as needed. , Disp: , Rfl:  .  NON FORMULARY, , Disp: , Rfl:  .  doxycycline (VIBRAMYCIN) 100 MG capsule, Take 1 capsule (100 mg total) by mouth 2 (two) times daily., Disp: 20 capsule, Rfl: 0  Allergies  Allergen Reactions  . Meloxicam Other (See Comments)    Per patient "Intestional cramps"    Objective:   BP 109/67   Pulse 73   SpO2 94%   Patient is well-developed, well-nourished in no acute distress.  Resting comfortably at home.  Head is normocephalic, atraumatic.  No labored breathing.  Speech is clear and coherent with logical content.  Patient is alert and oriented at baseline.    Assessment and Plan:   1. Acute bacterial sinusitis  After COVID. Rx Doxycyline.  Increase fluids.  Rest.  Saline nasal spray.  Probiotic.  Mucinex as directed.  Humidifier in bedroom.  Call or return to clinic if symptoms are not improving.  - doxycycline (VIBRAMYCIN) 100 MG capsule; Take 1 capsule (100 mg total) by mouth 2 (two) times daily.  Dispense: 20 capsule; Refill: 0    [name], [place] 08/02/2019

## 2019-08-02 NOTE — Patient Instructions (Signed)
Instructions sent to MyChart.   Please take antibiotic as directed.  Increase fluid intake.  Use Saline nasal spray.  Take a daily multivitamin. Continue vitamins.  Place a humidifier in the bedroom.  Please call or return clinic if symptoms are not improving.  Sinusitis Sinusitis is redness, soreness, and swelling (inflammation) of the paranasal sinuses. Paranasal sinuses are air pockets within the bones of your face (beneath the eyes, the middle of the forehead, or above the eyes). In healthy paranasal sinuses, mucus is able to drain out, and air is able to circulate through them by way of your nose. However, when your paranasal sinuses are inflamed, mucus and air can become trapped. This can allow bacteria and other germs to grow and cause infection. Sinusitis can develop quickly and last only a short time (acute) or continue over a long period (chronic). Sinusitis that lasts for more than 12 weeks is considered chronic.  CAUSES  Causes of sinusitis include:  Allergies.  Structural abnormalities, such as displacement of the cartilage that separates your nostrils (deviated septum), which can decrease the air flow through your nose and sinuses and affect sinus drainage.  Functional abnormalities, such as when the small hairs (cilia) that line your sinuses and help remove mucus do not work properly or are not present. SYMPTOMS  Symptoms of acute and chronic sinusitis are the same. The primary symptoms are pain and pressure around the affected sinuses. Other symptoms include:  Upper toothache.  Earache.  Headache.  Bad breath.  Decreased sense of smell and taste.  A cough, which worsens when you are lying flat.  Fatigue.  Fever.  Thick drainage from your nose, which often is green and may contain pus (purulent).  Swelling and warmth over the affected sinuses. DIAGNOSIS  Your caregiver will perform a physical exam. During the exam, your caregiver may:  Look in your nose for  signs of abnormal growths in your nostrils (nasal polyps).  Tap over the affected sinus to check for signs of infection.  View the inside of your sinuses (endoscopy) with a special imaging device with a light attached (endoscope), which is inserted into your sinuses. If your caregiver suspects that you have chronic sinusitis, one or more of the following tests may be recommended:  Allergy tests.  Nasal culture A sample of mucus is taken from your nose and sent to a lab and screened for bacteria.  Nasal cytology A sample of mucus is taken from your nose and examined by your caregiver to determine if your sinusitis is related to an allergy. TREATMENT  Most cases of acute sinusitis are related to a viral infection and will resolve on their own within 10 days. Sometimes medicines are prescribed to help relieve symptoms (pain medicine, decongestants, nasal steroid sprays, or saline sprays).  However, for sinusitis related to a bacterial infection, your caregiver will prescribe antibiotic medicines. These are medicines that will help kill the bacteria causing the infection.  Rarely, sinusitis is caused by a fungal infection. In theses cases, your caregiver will prescribe antifungal medicine. For some cases of chronic sinusitis, surgery is needed. Generally, these are cases in which sinusitis recurs more than 3 times per year, despite other treatments. HOME CARE INSTRUCTIONS   Drink plenty of water. Water helps thin the mucus so your sinuses can drain more easily.  Use a humidifier.  Inhale steam 3 to 4 times a day (for example, sit in the bathroom with the shower running).  Apply a warm, moist washcloth to  your face 3 to 4 times a day, or as directed by your caregiver.  Use saline nasal sprays to help moisten and clean your sinuses.  Take over-the-counter or prescription medicines for pain, discomfort, or fever only as directed by your caregiver. SEEK IMMEDIATE MEDICAL CARE IF:  You have  increasing pain or severe headaches.  You have nausea, vomiting, or drowsiness.  You have swelling around your face.  You have vision problems.  You have a stiff neck.  You have difficulty breathing. MAKE SURE YOU:   Understand these instructions.  Will watch your condition.  Will get help right away if you are not doing well or get worse. Document Released: 05/04/2005 Document Revised: 07/27/2011 Document Reviewed: 05/19/2011 [facility] Patient Information 2014 [place], [place].

## 2019-08-20 ENCOUNTER — Encounter: Payer: Self-pay | Admitting: Physician Assistant

## 2019-08-20 NOTE — Telephone Encounter (Signed)
Awaiting return e-mail for completed forms. Message sent to patient explaining office closure and re-opening on Tuesday. Will complete and e-mail patient completed forms at that time.

## 2019-09-05 ENCOUNTER — Telehealth: Payer: Self-pay | Admitting: Physician Assistant

## 2019-09-05 NOTE — Telephone Encounter (Signed)
Left message for patient to schedule Annual Wellness Visit.  Please schedule with Nurse Health Advisor Victoria Britt, RN at [facility]  

## 2019-09-10 ENCOUNTER — Encounter: Payer: Self-pay | Admitting: Physician Assistant
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
## 2019-12-12 ENCOUNTER — Ambulatory Visit (INDEPENDENT_AMBULATORY_CARE_PROVIDER_SITE_OTHER): Payer: Medicare Other

## 2019-12-12 VITALS — Ht 69.0 in | Wt 260.0 lb

## 2019-12-12 DIAGNOSIS — Z1211 Encounter for screening for malignant neoplasm of colon: Secondary | ICD-10-CM | POA: Diagnosis not present

## 2019-12-12 DIAGNOSIS — Z0000 Encounter for general adult medical examination without abnormal findings: Secondary | ICD-10-CM | POA: Diagnosis not present

## 2019-12-12 NOTE — Patient Instructions (Signed)
[name] , Thank you for taking time to complete your Medicare Wellness Visit. I appreciate your ongoing commitment to your health goals. Please review the following plan we discussed and let me know if I can assist you in the future.   Screening recommendations/referrals: Colonoscopy: Cologuard completed 04/15/2015-Ordered today. You will receive packet in the mail Mammogram: Completed 04/20/2019-Due 04/19/2020 Bone Density: Completed 2018-Due-As we discussed today,you may this done with your upcoming mammogram in December. If you decide you would like to have it before then please call the office to schedule. Recommended yearly ophthalmology/optometry visit for glaucoma screening and checkup Recommended yearly dental visit for hygiene and checkup  Vaccinations: Influenza vaccine: Due 01/2020 Pneumococcal vaccine: Due- Discuss with PCP at next office vist Tdap vaccine: Due- Discuss with pharmacy Shingles vaccine: Discuss with pharmacy   Covid-19:Declined at this time.  Advanced directives: Please bring a copy to your next office visit if available.  Conditions/risks identified: See problem list  Next appointment: Follow up in one year for your annual wellness visit    Preventive Care 65 Years and Older, Female Preventive care refers to lifestyle choices and visits with your health care provider that can promote health and wellness. What does preventive care include?  A yearly physical exam. This is also called an annual well check.  Dental exams once or twice a year.  Routine eye exams. Ask your health care provider how often you should have your eyes checked.  Personal lifestyle choices, including:  Daily care of your teeth and gums.  Regular physical activity.  Eating a healthy diet.  Avoiding tobacco and drug use.  Limiting alcohol use.  Practicing safe sex.  Taking low-dose aspirin every day.  Taking vitamin and mineral supplements as recommended by your health  care provider. What happens during an annual well check? The services and screenings done by your health care provider during your annual well check will depend on your age, overall health, lifestyle risk factors, and family history of disease. Counseling  Your health care provider may ask you questions about your:  Alcohol use.  Tobacco use.  Drug use.  Emotional well-being.  Home and relationship well-being.  Sexual activity.  Eating habits.  History of falls.  Memory and ability to understand (cognition).  Work and work Statistician.  Reproductive health. Screening  You may have the following tests or measurements:  Height, weight, and BMI.  Blood pressure.  Lipid and cholesterol levels. These may be checked every 5 years, or more frequently if you are over 47 years old.  Skin check.  Lung cancer screening. You may have this screening every year starting at age 108 if you have a 30-pack-year history of smoking and currently smoke or have quit within the past 15 years.  Fecal occult blood test (FOBT) of the stool. You may have this test every year starting at age 35.  Flexible sigmoidoscopy or colonoscopy. You may have a sigmoidoscopy every 5 years or a colonoscopy every 10 years starting at age 70.  Hepatitis C blood test.  Hepatitis B blood test.  Sexually transmitted disease (STD) testing.  Diabetes screening. This is done by checking your blood sugar (glucose) after you have not eaten for a while (fasting). You may have this done every 1-3 years.  Bone density scan. This is done to screen for osteoporosis. You may have this done starting at age 11.  Mammogram. This may be done every 1-2 years. Talk to your health care provider about how often  you should have regular mammograms. Talk with your health care provider about your test results, treatment options, and if necessary, the need for more tests. Vaccines  Your health care provider may recommend certain  vaccines, such as:  Influenza vaccine. This is recommended every year.  Tetanus, diphtheria, and acellular pertussis (Tdap, Td) vaccine. You may need a Td booster every 10 years.  Zoster vaccine. You may need this after age 25.  Pneumococcal 13-valent conjugate (PCV13) vaccine. One dose is recommended after age 55.  Pneumococcal polysaccharide (PPSV23) vaccine. One dose is recommended after age 2. Talk to your health care provider about which screenings and vaccines you need and how often you need them. This information is not intended to replace advice given to you by your health care provider. Make sure you discuss any questions you have with your health care provider. Document Released: 05/31/2015 Document Revised: 01/22/2016 Document Reviewed: 03/05/2015 Elsevier Interactive Patient Education  2017 [employer] Prevention in the Home Falls can cause injuries. They can happen to people of all ages. There are many things you can do to make your home safe and to help prevent falls. What can I do on the outside of my home?  Regularly fix the edges of walkways and driveways and fix any cracks.  Remove anything that might make you trip as you walk through a door, such as a raised step or threshold.  Trim any bushes or trees on the path to your home.  Use bright outdoor lighting.  Clear any walking paths of anything that might make someone trip, such as rocks or tools.  Regularly check to see if handrails are loose or broken. Make sure that both sides of any steps have handrails.  Any raised decks and porches should have guardrails on the edges.  Have any leaves, snow, or ice cleared regularly.  Use sand or salt on walking paths during winter.  Clean up any spills in your garage right away. This includes oil or grease spills. What can I do in the bathroom?  Use night lights.  Install grab bars by the toilet and in the tub and shower. Do not use towel bars as grab  bars.  Use non-skid mats or decals in the tub or shower.  If you need to sit down in the shower, use a plastic, non-slip stool.  Keep the floor dry. Clean up any water that spills on the floor as soon as it happens.  Remove soap buildup in the tub or shower regularly.  Attach bath mats securely with double-sided non-slip rug tape.  Do not have throw rugs and other things on the floor that can make you trip. What can I do in the bedroom?  Use night lights.  Make sure that you have a light by your bed that is easy to reach.  Do not use any sheets or blankets that are too big for your bed. They should not hang down onto the floor.  Have a firm chair that has side arms. You can use this for support while you get dressed.  Do not have throw rugs and other things on the floor that can make you trip. What can I do in the kitchen?  Clean up any spills right away.  Avoid walking on wet floors.  Keep items that you use a lot in easy-to-reach places.  If you need to reach something above you, use a strong step stool that has a grab bar.  Keep electrical cords  out of the way.  Do not use floor polish or wax that makes floors slippery. If you must use wax, use non-skid floor wax.  Do not have throw rugs and other things on the floor that can make you trip. What can I do with my stairs?  Do not leave any items on the stairs.  Make sure that there are handrails on both sides of the stairs and use them. Fix handrails that are broken or loose. Make sure that handrails are as long as the stairways.  Check any carpeting to make sure that it is firmly attached to the stairs. Fix any carpet that is loose or worn.  Avoid having throw rugs at the top or bottom of the stairs. If you do have throw rugs, attach them to the floor with carpet tape.  Make sure that you have a light switch at the top of the stairs and the bottom of the stairs. If you do not have them, ask someone to add them for  you. What else can I do to help prevent falls?  Wear shoes that:  Do not have high heels.  Have rubber bottoms.  Are comfortable and fit you well.  Are closed at the toe. Do not wear sandals.  If you use a stepladder:  Make sure that it is fully opened. Do not climb a closed stepladder.  Make sure that both sides of the stepladder are locked into place.  Ask someone to hold it for you, if possible.  Clearly mark and make sure that you can see:  Any grab bars or handrails.  First and last steps.  Where the edge of each step is.  Use tools that help you move around (mobility aids) if they are needed. These include:  Canes.  Walkers.  Scooters.  Crutches.  Turn on the lights when you go into a dark area. Replace any light bulbs as soon as they burn out.  Set up your furniture so you have a clear path. Avoid moving your furniture around.  If any of your floors are uneven, fix them.  If there are any pets around you, be aware of where they are.  Review your medicines with your doctor. Some medicines can make you feel dizzy. This can increase your chance of falling. Ask your doctor what other things that you can do to help prevent falls. This information is not intended to replace advice given to you by your health care provider. Make sure you discuss any questions you have with your health care provider. Document Released: 02/28/2009 Document Revised: 10/10/2015 Document Reviewed: 06/08/2014 Elsevier Interactive Patient Education  2017 [employer].

## 2019-12-18 DIAGNOSIS — Z1211 Encounter for screening for malignant neoplasm of colon: Secondary | ICD-10-CM | POA: Diagnosis not present

## 2019-12-18 LAB — COLOGUARD: Cologuard: NEGATIVE

## 2019-12-22 ENCOUNTER — Encounter: Payer: Self-pay | Admitting: Physician Assistant

## 2019-12-22 ENCOUNTER — Encounter: Payer: Self-pay | Admitting: Emergency Medicine

## 2019-12-22 LAB — COLOGUARD: COLOGUARD: NEGATIVE

## 2019-12-22 LAB — EXTERNAL GENERIC LAB PROCEDURE: COLOGUARD: NEGATIVE

## 2019-12-28 DIAGNOSIS — M9902 Segmental and somatic dysfunction of thoracic region: Secondary | ICD-10-CM | POA: Diagnosis not present

## 2019-12-28 DIAGNOSIS — M4807 Spinal stenosis, lumbosacral region: Secondary | ICD-10-CM | POA: Diagnosis not present

## 2019-12-28 DIAGNOSIS — M546 Pain in thoracic spine: Secondary | ICD-10-CM | POA: Diagnosis not present

## 2019-12-28 DIAGNOSIS — M9903 Segmental and somatic dysfunction of lumbar region: Secondary | ICD-10-CM | POA: Diagnosis not present

## 2019-12-28 DIAGNOSIS — M9901 Segmental and somatic dysfunction of cervical region: Secondary | ICD-10-CM | POA: Diagnosis not present

## 2019-12-28 DIAGNOSIS — M62830 Muscle spasm of back: Secondary | ICD-10-CM | POA: Diagnosis not present

## 2019-12-28 DIAGNOSIS — M5413 Radiculopathy, cervicothoracic region: Secondary | ICD-10-CM | POA: Diagnosis not present

## 2020-01-04 DIAGNOSIS — M4807 Spinal stenosis, lumbosacral region: Secondary | ICD-10-CM | POA: Diagnosis not present

## 2020-01-04 DIAGNOSIS — M5413 Radiculopathy, cervicothoracic region: Secondary | ICD-10-CM | POA: Diagnosis not present

## 2020-01-04 DIAGNOSIS — M9902 Segmental and somatic dysfunction of thoracic region: Secondary | ICD-10-CM | POA: Diagnosis not present

## 2020-01-04 DIAGNOSIS — M9901 Segmental and somatic dysfunction of cervical region: Secondary | ICD-10-CM | POA: Diagnosis not present

## 2020-01-04 DIAGNOSIS — M62830 Muscle spasm of back: Secondary | ICD-10-CM | POA: Diagnosis not present

## 2020-01-04 DIAGNOSIS — M9903 Segmental and somatic dysfunction of lumbar region: Secondary | ICD-10-CM | POA: Diagnosis not present

## 2020-01-04 DIAGNOSIS — M546 Pain in thoracic spine: Secondary | ICD-10-CM | POA: Diagnosis not present

## 2020-01-08 DIAGNOSIS — M5413 Radiculopathy, cervicothoracic region: Secondary | ICD-10-CM | POA: Diagnosis not present

## 2020-01-08 DIAGNOSIS — M9901 Segmental and somatic dysfunction of cervical region: Secondary | ICD-10-CM | POA: Diagnosis not present

## 2020-01-08 DIAGNOSIS — M9903 Segmental and somatic dysfunction of lumbar region: Secondary | ICD-10-CM | POA: Diagnosis not present

## 2020-01-08 DIAGNOSIS — M62830 Muscle spasm of back: Secondary | ICD-10-CM | POA: Diagnosis not present

## 2020-01-08 DIAGNOSIS — M4807 Spinal stenosis, lumbosacral region: Secondary | ICD-10-CM | POA: Diagnosis not present

## 2020-01-08 DIAGNOSIS — M9902 Segmental and somatic dysfunction of thoracic region: Secondary | ICD-10-CM | POA: Diagnosis not present

## 2020-01-08 DIAGNOSIS — M546 Pain in thoracic spine: Secondary | ICD-10-CM | POA: Diagnosis not present

## 2020-01-11 DIAGNOSIS — M5413 Radiculopathy, cervicothoracic region: Secondary | ICD-10-CM | POA: Diagnosis not present

## 2020-01-11 DIAGNOSIS — M62830 Muscle spasm of back: Secondary | ICD-10-CM | POA: Diagnosis not present

## 2020-01-11 DIAGNOSIS — M9902 Segmental and somatic dysfunction of thoracic region: Secondary | ICD-10-CM | POA: Diagnosis not present

## 2020-01-11 DIAGNOSIS — M9901 Segmental and somatic dysfunction of cervical region: Secondary | ICD-10-CM | POA: Diagnosis not present

## 2020-01-11 DIAGNOSIS — M9903 Segmental and somatic dysfunction of lumbar region: Secondary | ICD-10-CM | POA: Diagnosis not present

## 2020-01-11 DIAGNOSIS — M4807 Spinal stenosis, lumbosacral region: Secondary | ICD-10-CM | POA: Diagnosis not present

## 2020-01-11 DIAGNOSIS — M546 Pain in thoracic spine: Secondary | ICD-10-CM | POA: Diagnosis not present

## 2020-01-15 DIAGNOSIS — M62830 Muscle spasm of back: Secondary | ICD-10-CM | POA: Diagnosis not present

## 2020-01-15 DIAGNOSIS — M9901 Segmental and somatic dysfunction of cervical region: Secondary | ICD-10-CM | POA: Diagnosis not present

## 2020-01-15 DIAGNOSIS — M9902 Segmental and somatic dysfunction of thoracic region: Secondary | ICD-10-CM | POA: Diagnosis not present

## 2020-01-15 DIAGNOSIS — M9903 Segmental and somatic dysfunction of lumbar region: Secondary | ICD-10-CM | POA: Diagnosis not present

## 2020-01-15 DIAGNOSIS — M4807 Spinal stenosis, lumbosacral region: Secondary | ICD-10-CM | POA: Diagnosis not present

## 2020-01-15 DIAGNOSIS — M546 Pain in thoracic spine: Secondary | ICD-10-CM | POA: Diagnosis not present

## 2020-01-15 DIAGNOSIS — M5413 Radiculopathy, cervicothoracic region: Secondary | ICD-10-CM | POA: Diagnosis not present

## 2020-01-18 DIAGNOSIS — M9903 Segmental and somatic dysfunction of lumbar region: Secondary | ICD-10-CM | POA: Diagnosis not present

## 2020-01-18 DIAGNOSIS — M5413 Radiculopathy, cervicothoracic region: Secondary | ICD-10-CM | POA: Diagnosis not present

## 2020-01-18 DIAGNOSIS — M9901 Segmental and somatic dysfunction of cervical region: Secondary | ICD-10-CM | POA: Diagnosis not present

## 2020-01-18 DIAGNOSIS — M4807 Spinal stenosis, lumbosacral region: Secondary | ICD-10-CM | POA: Diagnosis not present

## 2020-01-18 DIAGNOSIS — M546 Pain in thoracic spine: Secondary | ICD-10-CM | POA: Diagnosis not present

## 2020-01-18 DIAGNOSIS — M62830 Muscle spasm of back: Secondary | ICD-10-CM | POA: Diagnosis not present

## 2020-01-18 DIAGNOSIS — M9902 Segmental and somatic dysfunction of thoracic region: Secondary | ICD-10-CM | POA: Diagnosis not present

## 2020-01-31 DIAGNOSIS — M62830 Muscle spasm of back: Secondary | ICD-10-CM | POA: Diagnosis not present

## 2020-01-31 DIAGNOSIS — M9901 Segmental and somatic dysfunction of cervical region: Secondary | ICD-10-CM | POA: Diagnosis not present

## 2020-01-31 DIAGNOSIS — M9903 Segmental and somatic dysfunction of lumbar region: Secondary | ICD-10-CM | POA: Diagnosis not present

## 2020-01-31 DIAGNOSIS — M5413 Radiculopathy, cervicothoracic region: Secondary | ICD-10-CM | POA: Diagnosis not present

## 2020-01-31 DIAGNOSIS — M546 Pain in thoracic spine: Secondary | ICD-10-CM | POA: Diagnosis not present

## 2020-01-31 DIAGNOSIS — M4807 Spinal stenosis, lumbosacral region: Secondary | ICD-10-CM | POA: Diagnosis not present

## 2020-01-31 DIAGNOSIS — M9902 Segmental and somatic dysfunction of thoracic region: Secondary | ICD-10-CM | POA: Diagnosis not present

## 2020-02-08 DIAGNOSIS — M546 Pain in thoracic spine: Secondary | ICD-10-CM | POA: Diagnosis not present

## 2020-02-08 DIAGNOSIS — M62830 Muscle spasm of back: Secondary | ICD-10-CM | POA: Diagnosis not present

## 2020-02-08 DIAGNOSIS — M5413 Radiculopathy, cervicothoracic region: Secondary | ICD-10-CM | POA: Diagnosis not present

## 2020-02-08 DIAGNOSIS — M4807 Spinal stenosis, lumbosacral region: Secondary | ICD-10-CM | POA: Diagnosis not present

## 2020-02-08 DIAGNOSIS — M9902 Segmental and somatic dysfunction of thoracic region: Secondary | ICD-10-CM | POA: Diagnosis not present

## 2020-02-08 DIAGNOSIS — M9901 Segmental and somatic dysfunction of cervical region: Secondary | ICD-10-CM | POA: Diagnosis not present

## 2020-02-08 DIAGNOSIS — M9903 Segmental and somatic dysfunction of lumbar region: Secondary | ICD-10-CM | POA: Diagnosis not present

## 2020-02-22 ENCOUNTER — Encounter: Payer: Self-pay | Admitting: Physician Assistant

## 2020-02-23 ENCOUNTER — Telehealth (INDEPENDENT_AMBULATORY_CARE_PROVIDER_SITE_OTHER): Payer: Medicare Other | Admitting: Physician Assistant

## 2020-02-23 ENCOUNTER — Encounter: Payer: Self-pay | Admitting: Physician Assistant

## 2020-02-23 DIAGNOSIS — K5792 Diverticulitis of intestine, part unspecified, without perforation or abscess without bleeding: Secondary | ICD-10-CM | POA: Diagnosis not present

## 2020-02-23 MED ORDER — AMOXICILLIN-POT CLAVULANATE 875-125 MG PO TABS: 1.0000 | ORAL_TABLET | Freq: Two times a day (BID) | ORAL | 0 refills | Status: DC

## 2020-02-23 NOTE — Patient Instructions (Signed)
Instructions sent to patients MyChart.

## 2020-02-23 NOTE — Progress Notes (Signed)
Virtual Visit via Video   I connected with patient on 02/23/20 at 11:30 AM EDT by a video enabled telemedicine application and verified that I am speaking with the correct person using two identifiers.  Location patient: Home Location provider: [facility], Office Persons participating in the virtual visit: Patient, Provider, [place] (Patina Moore)  I discussed the limitations of evaluation and management by telemedicine and the availability of in person appointments. The patient expressed understanding and agreed to proceed.  Subjective:   HPI:   Patient presents via [facility] today c/o 1 week of fluctuating constipation and loose stool with LLQ abdomina cramping and discomfort. Denies fever but did note chills initially that have now resolved. Notes some nausea without vomiting. Denies melena, hematochezia. Notes that she switched to a liquid diet which has helped considerably and has been resting. Daughter very sick -- recently diagnosed with post-viral cardiomyopathy.  Currently is her complete caregiver so has not been able to take care of herself in the meantime. Notes change in diet and hydration due to this.   ROS:   See pertinent positives and negatives per HPI.  Patient Active Problem List   Diagnosis Date Noted  . Aortic stenosis, mild 04/21/2019  . Family history of pulmonary embolism 04/21/2019  . Murmur 10/09/2015  . Bruit 10/09/2015  . AC (acromioclavicular) arthritis 07/09/2014  . Disorder of rotator cuff 07/09/2014  . Palpitations and heart murmur  10/24/2013  . Diverticulitis of colon 01/06/2013  . Annual physical exam 09/07/2011  . Osteopenia 07/11/2010  . VITAMIN D DEFICIENCY 05/05/2010  . Morbid obesity ([place]) 05/05/2010  . Osteoarthritis-pain management 11/09/2007  . POSTMENOPAUSAL STATUS 03/16/2007  . History of fibromyalgia 03/16/2007    Social History   Tobacco Use  . Smoking status: Never Smoker  . Smokeless tobacco: Never Used  Substance  Use Topics  . Alcohol use: No    Current Outpatient Medications:  .  AMBULATORY NON FORMULARY MEDICATION, Continue Rolfing therapy once weekly or as needed for joint pain and/or stabilizing gait., Disp: 1 Package, Rfl: 0 .  ELDERBERRY PO, Take 2 capsules by mouth 2 (two) times daily., Disp: , Rfl:  .  ibuprofen (ADVIL,MOTRIN) 200 MG tablet, Take 200 mg by mouth every 8 (eight) hours as needed., Disp: , Rfl:  .  Misc Natural Products (TURMERIC CURCUMIN) CAPS, Take 2 capsules by mouth 2 (two) times daily., Disp: , Rfl:  .  NON FORMULARY, , Disp: , Rfl:  .  aspirin EC 81 MG tablet, Take 1 tablet (81 mg total) by mouth daily. (Patient not taking: Reported on 02/23/2020), Disp: 90 tablet, Rfl: 3 .  B Complex-C (B-COMPLEX WITH VITAMIN C) tablet, Take 2 tablets by mouth daily., Disp: , Rfl:  .  BEE POLLEN PO, Take 2 capsules by mouth 2 (two) times daily., Disp: , Rfl:  .  CALCIUM-MAGNESIUM-VITAMIN D PO, Take 2 capsules by mouth 2 (two) times daily., Disp: , Rfl:  .  doxycycline (VIBRAMYCIN) 100 MG capsule, Take 1 capsule (100 mg total) by mouth 2 (two) times daily., Disp: 20 capsule, Rfl: 0  Allergies  Allergen Reactions  . Meloxicam Other (See Comments)    Per patient "Intestional cramps"    Objective:   There were no vitals taken for this visit.  Patient is well-developed, well-nourished in no acute distress.  Resting comfortably at home.  Head is normocephalic, atraumatic.  No labored breathing.  Speech is clear and coherent with logical content.  Patient is alert and oriented at baseline.  Assessment and Plan:   1. Diverticulitis Mild.  Has positive history of identical symptoms.  No alarm signs or symptoms present.  Will initiate antibiotic therapy with Augmentin twice daily x7 days.  Continue liquid diet, transitioning to soft bland solid foods as symptoms are improving/resolving.  Supportive measures and OTC medications reviewed.  Strict ER precautions discussed with patient.  A  written copy of instructions was sent to the patient's MyChart.    Leeanne Rio, PA-C 02/23/2020

## 2020-03-08 ENCOUNTER — Encounter: Payer: Self-pay | Admitting: Physician Assistant

## 2020-04-03 ENCOUNTER — Telehealth (INDEPENDENT_AMBULATORY_CARE_PROVIDER_SITE_OTHER): Payer: Medicare Other | Admitting: Physician Assistant

## 2020-04-03 ENCOUNTER — Other Ambulatory Visit: Payer: Self-pay

## 2020-04-03 ENCOUNTER — Encounter: Payer: Self-pay | Admitting: Physician Assistant

## 2020-04-03 DIAGNOSIS — G8929 Other chronic pain: Secondary | ICD-10-CM | POA: Diagnosis not present

## 2020-04-03 DIAGNOSIS — M48061 Spinal stenosis, lumbar region without neurogenic claudication: Secondary | ICD-10-CM

## 2020-04-03 DIAGNOSIS — M25552 Pain in left hip: Secondary | ICD-10-CM

## 2020-04-03 MED ORDER — TRAMADOL HCL 50 MG PO TABS: 50.0000 mg | ORAL_TABLET | Freq: Three times a day (TID) | ORAL | 0 refills | Status: AC | PRN

## 2020-04-03 MED ORDER — METHYLPREDNISOLONE 4 MG PO TBPK: ORAL_TABLET | ORAL | 0 refills | Status: DC

## 2020-04-03 NOTE — Progress Notes (Signed)
I have discussed the procedure for the virtual visit with the patient who has given consent to proceed with assessment and treatment.   [name] S [name], CMA     

## 2020-04-03 NOTE — Progress Notes (Signed)
Virtual Visit via Video   I connected with patient on 04/03/20 at  1:30 PM EST by a video enabled telemedicine application and verified that I am speaking with the correct person using two identifiers.  Location patient: Home Location provider: [facility], Office Persons participating in the virtual visit: Patient, Provider, [place] (Patina Moore)  I discussed the limitations of evaluation and management by telemedicine and the availability of in person appointments. The patient expressed understanding and agreed to proceed.  Subjective:   HPI:   Patient presents via [place] today to discuss ongoing and worsening issues with her left hip.  Patient has been dealing with left hip pain since twenty 67 when her garage door malfunction, slamming down onto her side.  Has been seeing a chiropractor and massage therapist which is helping for a while.  Is no longer helping.  Notes significant daily pain, worse with moving from a sitting to standing position.  Is alleviated after she gets to walking a bit.  She states now she feels like her left leg has some weakness in it.  Denies saddle anesthesia or change to bowel or bladder habits.  Does note occasional back pain endorses being diagnosed with lumbar stenosis by imaging in 1983.  Has taken some over-the-counter Motrin with minimal relief.  Occasionally pain radiates from her hip down her thigh.  Denies numbness or tingling of lower extremity.  Does note occasional right hip pain as well but this is mild.   ROS:   See pertinent positives and negatives per HPI.  Patient Active Problem List   Diagnosis Date Noted  . Aortic stenosis, mild 04/21/2019  . Family history of pulmonary embolism 04/21/2019  . Murmur 10/09/2015  . Bruit 10/09/2015  . AC (acromioclavicular) arthritis 07/09/2014  . Disorder of rotator cuff 07/09/2014  . Palpitations and heart murmur  10/24/2013  . Diverticulitis of colon 01/06/2013  . Annual physical  exam 09/07/2011  . Osteopenia 07/11/2010  . VITAMIN D DEFICIENCY 05/05/2010  . Morbid obesity ([place]) 05/05/2010  . Osteoarthritis-pain management 11/09/2007  . POSTMENOPAUSAL STATUS 03/16/2007  . History of fibromyalgia 03/16/2007    Social History   Tobacco Use  . Smoking status: Never Smoker  . Smokeless tobacco: Never Used  Substance Use Topics  . Alcohol use: No    Current Outpatient Medications:  .  AMBULATORY NON FORMULARY MEDICATION, Continue Rolfing therapy once weekly or as needed for joint pain and/or stabilizing gait., Disp: 1 Package, Rfl: 0 .  B Complex-C (B-COMPLEX WITH VITAMIN C) tablet, Take 2 tablets by mouth daily., Disp: , Rfl:  .  BEE POLLEN PO, Take 2 capsules by mouth 2 (two) times daily., Disp: , Rfl:  .  CALCIUM-MAGNESIUM-VITAMIN D PO, Take 2 capsules by mouth 2 (two) times daily., Disp: , Rfl:  .  ELDERBERRY PO, Take 2 capsules by mouth 2 (two) times daily., Disp: , Rfl:  .  ibuprofen (ADVIL,MOTRIN) 200 MG tablet, Take 200 mg by mouth every 8 (eight) hours as needed., Disp: , Rfl:  .  Misc Natural Products (TURMERIC CURCUMIN) CAPS, Take 2 capsules by mouth 2 (two) times daily., Disp: , Rfl:  .  NON FORMULARY, , Disp: , Rfl:   Allergies  Allergen Reactions  . Meloxicam Other (See Comments)    Per patient "Intestional cramps"    Objective:   There were no vitals taken for this visit.  Patient is well-developed, well-nourished in no acute distress.  Resting comfortably at home.  Head is normocephalic, atraumatic.  No labored breathing.  Speech is clear and coherent with logical content.  Patient is alert and oriented at baseline.   Assessment and Plan:   1. Spinal stenosis of lumbar region without neurogenic claudication Prior history.  Potentially component of sciatica occurring intermittently.  Will need repeat imaging as it has been decades.  We will start with plain films of lumbar spine.  Supportive measures and stretching discussed.  Rx Medrol  Dosepak to see if this will calm down some of the radiating pain.  Plan to refer her to orthopedics pending imaging of her hip as well. - DG Lumbar Spine Complete; Future  2. Chronic left hip pain We will obtain x-ray to assess for arthritic changes.  Plan to refer to orthopedics.  Discussed pain medication.  We will start Rx for tramadol for more moderate to severe pain.  Alternate Tylenol and ibuprofen for milder pain.  Voltaren gel discussed. - DG HIP UNILAT WITH PELVIS 2-3 VIEWS LEFT; Future    Leeanne Rio, PA-C 04/03/2020

## 2020-04-04 ENCOUNTER — Ambulatory Visit (HOSPITAL_BASED_OUTPATIENT_CLINIC_OR_DEPARTMENT_OTHER)
Admission: RE | Admit: 2020-04-04 | Discharge: 2020-04-04 | Disposition: A | Discharge status: Home / Self Care | Payer: Medicare Other | Source: Ambulatory Visit | Attending: Physician Assistant | Admitting: Physician Assistant

## 2020-04-04 DIAGNOSIS — M25552 Pain in left hip: Secondary | ICD-10-CM | POA: Insufficient documentation

## 2020-04-04 DIAGNOSIS — M5136 Other intervertebral disc degeneration, lumbar region: Secondary | ICD-10-CM | POA: Diagnosis not present

## 2020-04-04 DIAGNOSIS — M48061 Spinal stenosis, lumbar region without neurogenic claudication: Secondary | ICD-10-CM | POA: Insufficient documentation

## 2020-04-04 DIAGNOSIS — G8929 Other chronic pain: Secondary | ICD-10-CM | POA: Insufficient documentation

## 2020-04-04 DIAGNOSIS — M4316 Spondylolisthesis, lumbar region: Secondary | ICD-10-CM | POA: Diagnosis not present

## 2020-04-05 DIAGNOSIS — M546 Pain in thoracic spine: Secondary | ICD-10-CM | POA: Diagnosis not present

## 2020-04-05 DIAGNOSIS — M62830 Muscle spasm of back: Secondary | ICD-10-CM | POA: Diagnosis not present

## 2020-04-05 DIAGNOSIS — M4807 Spinal stenosis, lumbosacral region: Secondary | ICD-10-CM | POA: Diagnosis not present

## 2020-04-05 DIAGNOSIS — M5413 Radiculopathy, cervicothoracic region: Secondary | ICD-10-CM | POA: Diagnosis not present

## 2020-04-05 DIAGNOSIS — M9902 Segmental and somatic dysfunction of thoracic region: Secondary | ICD-10-CM | POA: Diagnosis not present

## 2020-04-05 DIAGNOSIS — M9903 Segmental and somatic dysfunction of lumbar region: Secondary | ICD-10-CM | POA: Diagnosis not present

## 2020-04-05 DIAGNOSIS — M9901 Segmental and somatic dysfunction of cervical region: Secondary | ICD-10-CM | POA: Diagnosis not present

## 2020-04-06 ENCOUNTER — Encounter: Payer: Self-pay | Admitting: Physician Assistant

## 2020-04-07 NOTE — Telephone Encounter (Signed)
Can we print out and get scanned into patient's EMR. Thank you.

## 2020-04-08 ENCOUNTER — Other Ambulatory Visit: Payer: Self-pay

## 2020-04-08 DIAGNOSIS — M160 Bilateral primary osteoarthritis of hip: Secondary | ICD-10-CM

## 2020-04-08 DIAGNOSIS — R262 Difficulty in walking, not elsewhere classified: Secondary | ICD-10-CM

## 2020-04-08 NOTE — Telephone Encounter (Signed)
Printed to be scanned in

## 2020-04-15 ENCOUNTER — Ambulatory Visit: Payer: Self-pay

## 2020-04-15 ENCOUNTER — Encounter: Payer: Self-pay | Admitting: Family Medicine

## 2020-04-15 ENCOUNTER — Ambulatory Visit (INDEPENDENT_AMBULATORY_CARE_PROVIDER_SITE_OTHER): Payer: Medicare Other | Admitting: Family Medicine

## 2020-04-15 ENCOUNTER — Other Ambulatory Visit: Payer: Self-pay

## 2020-04-15 DIAGNOSIS — M25572 Pain in left ankle and joints of left foot: Secondary | ICD-10-CM | POA: Diagnosis not present

## 2020-04-15 DIAGNOSIS — G8929 Other chronic pain: Secondary | ICD-10-CM

## 2020-04-15 DIAGNOSIS — M25562 Pain in left knee: Secondary | ICD-10-CM | POA: Diagnosis not present

## 2020-04-15 DIAGNOSIS — M1612 Unilateral primary osteoarthritis, left hip: Secondary | ICD-10-CM

## 2020-04-15 DIAGNOSIS — M4726 Other spondylosis with radiculopathy, lumbar region: Secondary | ICD-10-CM

## 2020-04-15 DIAGNOSIS — M1611 Unilateral primary osteoarthritis, right hip: Secondary | ICD-10-CM | POA: Diagnosis not present
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
## 2020-04-17 ENCOUNTER — Ambulatory Visit: Payer: Medicare Other | Admitting: Family Medicine

## 2020-04-30 ENCOUNTER — Encounter: Payer: Self-pay | Admitting: Family Medicine

## 2020-05-01 ENCOUNTER — Encounter: Payer: Self-pay | Admitting: Physician Assistant

## 2020-05-01 ENCOUNTER — Telehealth: Payer: Self-pay | Admitting: Orthopaedic Surgery

## 2020-05-01 NOTE — Telephone Encounter (Signed)
Called patient left message to return call concerning scheduling an appointment for a consult for hip replacement

## 2020-05-13 ENCOUNTER — Other Ambulatory Visit: Payer: Self-pay

## 2020-05-13 ENCOUNTER — Ambulatory Visit (INDEPENDENT_AMBULATORY_CARE_PROVIDER_SITE_OTHER): Payer: Medicare Other | Admitting: Orthopaedic Surgery

## 2020-05-13 VITALS — Ht 66.5 in | Wt 258.8 lb

## 2020-05-13 DIAGNOSIS — M1612 Unilateral primary osteoarthritis, left hip: Secondary | ICD-10-CM | POA: Diagnosis not present
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
## 2020-05-29 ENCOUNTER — Encounter: Payer: Self-pay | Admitting: Physician Assistant

## 2020-05-29 ENCOUNTER — Other Ambulatory Visit: Payer: Self-pay

## 2020-05-29 ENCOUNTER — Telehealth (INDEPENDENT_AMBULATORY_CARE_PROVIDER_SITE_OTHER): Payer: Medicare Other | Admitting: Physician Assistant

## 2020-05-29 VITALS — BP 127/88 | HR 65 | Wt 245.0 lb

## 2020-05-29 DIAGNOSIS — Z20822 Contact with and (suspected) exposure to covid-19: Secondary | ICD-10-CM

## 2020-05-29 DIAGNOSIS — R509 Fever, unspecified: Secondary | ICD-10-CM | POA: Diagnosis not present

## 2020-05-29 NOTE — Progress Notes (Signed)
Virtual Visit via Video   I connected with patient on 05/29/20 at  9:30 AM EST by a video enabled telemedicine application and verified that I am speaking with the correct person using two identifiers.  Location patient: Home Location provider: [facility], Office Persons participating in the virtual visit: Patient, Provider, [place] (Patina Moore)  I discussed the limitations of evaluation and management by telemedicine and the availability of in person appointments. The patient expressed understanding and agreed to proceed.  Subjective:   HPI:   Patient presents via Care[facility] today complaining of exposure to COVID through her son and her twin sister both of whom tested positive for COVID and started with URI symptoms 1 day before her.  Patient has symptoms since 05/27/2020 including nasal congestion, cough, ear pressure and mild discomfort.  Denies any had fever but states she did have an episode where she felt feverish.  Denies chest pain or shortness of breath.  Is taking elderberry, zinc, vitamin D, vitamin C and ribs hips.  Is trying to stay well-hydrated and get plenty of rest.  Patient is unvaccinated.  ROS:   See pertinent positives and negatives per HPI.  Patient Active Problem List   Diagnosis Date Noted  . Severe obesity (BMI >= 40) ([place]) 05/13/2020  . Unilateral primary osteoarthritis, left hip 05/13/2020  . Aortic stenosis, mild 04/21/2019  . Family history of pulmonary embolism 04/21/2019  . Murmur 10/09/2015  . Bruit 10/09/2015  . AC (acromioclavicular) arthritis 07/09/2014  . Disorder of rotator cuff 07/09/2014  . Palpitations and heart murmur  10/24/2013  . Diverticulitis of colon 01/06/2013  . Annual physical exam 09/07/2011  . Osteopenia 07/11/2010  . VITAMIN D DEFICIENCY 05/05/2010  . Morbid obesity ([place]) 05/05/2010  . Osteoarthritis-pain management 11/09/2007  . POSTMENOPAUSAL STATUS 03/16/2007  . History of fibromyalgia 03/16/2007    Social  History   Tobacco Use  . Smoking status: Never Smoker  . Smokeless tobacco: Never Used  Substance Use Topics  . Alcohol use: No    Current Outpatient Medications:  .  AMBULATORY NON FORMULARY MEDICATION, Continue Rolfing therapy once weekly or as needed for joint pain and/or stabilizing gait., Disp: 1 Package, Rfl: 0 .  ELDERBERRY PO, Take 2 capsules by mouth 2 (two) times daily., Disp: , Rfl:  .  ibuprofen (ADVIL,MOTRIN) 200 MG tablet, Take 200 mg by mouth every 8 (eight) hours as needed., Disp: , Rfl:  .  Misc Natural Products (TURMERIC CURCUMIN) CAPS, Take 2 capsules by mouth 2 (two) times daily., Disp: , Rfl:  .  NON FORMULARY, , Disp: , Rfl:   Allergies  Allergen Reactions  . Meloxicam Other (See Comments)    Per patient "Intestional cramps"    Objective:   BP 127/88   Pulse 65   SpO2 97%   Patient is well-developed, well-nourished in no acute distress.  Resting comfortably at home.  Head is normocephalic, atraumatic.  No labored breathing.  Speech is clear and coherent with logical content.  Patient is alert and oriented at baseline.   Assessment and Plan:    1. Suspected COVID-19 virus infection To positive contacts in her home.  Patient recurrent symptoms although thankfully mild.  Discussed testing in order to get her considered for monoclonal antibody infusion given age, body habitus and chronic medical issues.  Testing sites reviewed.  Continue vitamins.  OTC saline nasal rinse and Flonase recommended.  Other supportive measures and OTC medications discussed.  Patient enrolled in symptom monitoring program.  She  is to notify us as soon as she gets her results.  She is to quarantine until these results are in.  Strict ER precautions discussed with patient who voiced understanding and agreement plan.    Leeanne Rio, PA-C 05/29/2020

## 2020-05-29 NOTE — Patient Instructions (Signed)
Instructions sent to patients MyChart.

## 2020-05-29 NOTE — Progress Notes (Signed)
I have discussed the procedure for the virtual visit with the patient who has given consent to proceed with assessment and treatment.   Bertha Lokken S Santonio Speakman, CMA     

## 2020-05-30 ENCOUNTER — Telehealth: Payer: Medicare Other | Admitting: Physician Assistant

## 2020-05-31 ENCOUNTER — Telehealth: Payer: Self-pay | Admitting: Oncology

## 2020-05-31 NOTE — Telephone Encounter (Signed)
Called to Discuss with patient about Covid symptoms and the use of the monoclonal antibody infusion for those with mild to moderate Covid symptoms and at a high risk of hospitalization.     Pt is qualified for this infusion due to co-morbid conditions and/or a member of an at-risk group.     Patient Active Problem List   Diagnosis Date Noted  . Severe obesity (BMI >= 40) ([place]) 05/13/2020  . Unilateral primary osteoarthritis, left hip 05/13/2020  . Aortic stenosis, mild 04/21/2019  . Family history of pulmonary embolism 04/21/2019  . Murmur 10/09/2015  . Bruit 10/09/2015  . AC (acromioclavicular) arthritis 07/09/2014  . Disorder of rotator cuff 07/09/2014  . Palpitations and heart murmur  10/24/2013  . Diverticulitis of colon 01/06/2013  . Annual physical exam 09/07/2011  . Osteopenia 07/11/2010  . VITAMIN D DEFICIENCY 05/05/2010  . Morbid obesity ([place]) 05/05/2010  . Osteoarthritis-pain management 11/09/2007  . POSTMENOPAUSAL STATUS 03/16/2007  . History of fibromyalgia 03/16/2007    Patient declines infusion at this time. Symptoms tier reviewed as well as criteria for ending isolation. Preventative practices reviewed. Patient verbalized understanding.    Patient advised to call back if he/she decides that he/she does want to get infusion. Callback number to the infusion center given. Patient advised to go to Urgent care or ED with severe symptoms.   Faythe Casa, NP 05/31/2020 12:17 PM

## 2020-06-04 DIAGNOSIS — Z9189 Other specified personal risk factors, not elsewhere classified: Secondary | ICD-10-CM | POA: Diagnosis not present

## 2020-06-04 DIAGNOSIS — U071 COVID-19: Secondary | ICD-10-CM | POA: Diagnosis not present

## 2020-06-13 ENCOUNTER — Encounter: Payer: Self-pay | Admitting: Physician Assistant

## 2020-06-13 MED ORDER — TOBRAMYCIN-DEXAMETHASONE 0.3-0.1 % OP SUSP: OPHTHALMIC | 0 refills | Status: AC

## 2020-06-17 ENCOUNTER — Ambulatory Visit (INDEPENDENT_AMBULATORY_CARE_PROVIDER_SITE_OTHER): Payer: Medicare Other | Admitting: Orthopaedic Surgery

## 2020-06-17 VITALS — Ht 66.75 in | Wt 250.0 lb

## 2020-06-17 DIAGNOSIS — M1612 Unilateral primary osteoarthritis, left hip: Secondary | ICD-10-CM

## 2020-06-17 NOTE — Progress Notes (Signed)
The patient comes in today for a weight check and BMI calculation.  She has debilitating end-stage arthritis of her left hip that is well-documented.  Her BMI before was over 41.  She has lost weight.  Today her BMI is down to 39.45.  She does have debilitating left hip pain well-documented x-rays showing end-stage arthritis of the left hip.  She is not a diabetic.  She has a previous history of a left ankle fusion.  I was able to examine that in her left hip today as well.  At this point I am comfortable with getting her on the operating room schedule for a left total hip arthroplasty.  She is already seen a hip model and her x-rays and I explained in detail what the surgery involves.  We had a long thorough discussion about the risk and benefits of surgery.  Since I saw her last she did have COVID-19 but has since recovered.  She understands that it may be 4 to 6 weeks before we get her on the schedule due to the pandemic and our scheduling delays but hopefully soon working to get people back on the schedule.  She is someone out have stay overnight as well.  All questions and concerns were answered and addressed.  I told her to continue her weight loss journey as well.  We will work on getting her surgery scheduled and then see her back in 2 weeks postoperative.

## 2020-06-20 ENCOUNTER — Telehealth: Payer: Self-pay | Admitting: Physician Assistant

## 2020-06-20 NOTE — Telephone Encounter (Signed)
Patient has questions about eye drop that was called in by Einar Pheasant - she said she needs a handwritten prescription so she can carry it to another drug store - [facility] is charging $90 for this rx.

## 2020-06-21 NOTE — Telephone Encounter (Signed)
Pt called in asking for an update, she would like to pick up a hard copy script today. Please advise if Dr.Tabori can do this for the pt

## 2020-06-24 ENCOUNTER — Other Ambulatory Visit: Payer: Self-pay

## 2020-06-24 ENCOUNTER — Telehealth: Payer: Self-pay

## 2020-06-24 NOTE — Telephone Encounter (Signed)
Would have to be handled by Dr. Birdie Riddle as I am no longer at [facility].

## 2020-06-24 NOTE — Telephone Encounter (Signed)
Called and spoke with patient about her Rx. Pt will be by the office to pick up Rx at front desk.

## 2020-06-24 NOTE — Telephone Encounter (Signed)
Called and spoke with patient about concerns and patient stated that she will be by the office to pick up Rx at front desk.

## 2020-06-24 NOTE — Telephone Encounter (Signed)
Prescription written.  Pt can pick up from office at her convenience

## 2020-07-01 ENCOUNTER — Ambulatory Visit: Payer: Medicare Other | Admitting: Orthopaedic Surgery

## 2020-07-23 ENCOUNTER — Other Ambulatory Visit: Payer: Self-pay

## 2020-08-08 NOTE — Progress Notes (Addendum)
PCP - Dr. Ophelia Charter  Cardiologist - 04-21-19 LOV Kerin Ransom PA-C  PPM/ICD -  Device Orders -  Rep Notified -   Chest x-ray -  EKG -  Stress Test - 2019 ECHO - 2020 Cardiac Cath -   Sleep Study -  CPAP -   Fasting Blood Sugar -  Checks Blood Sugar _____ times a day  Blood Thinner Instructions: Aspirin Instructions:  ERAS Protcol - PRE-SURGERY Ensure  COVID TEST- 08-21-20  Activity---Does ADL's without SOB Anesthesia review: mild aortic stenosis  Patient denies shortness of breath, fever, cough and chest pain at PAT appointment   All instructions explained to the patient, with a verbal understanding of the material. Patient agrees to go over the instructions while at home for a better understanding. Patient also instructed to self quarantine after being tested for COVID-19. The opportunity to ask questions was provided.

## 2020-08-08 NOTE — Patient Instructions (Addendum)
DUE TO COVID-19 ONLY ONE VISITOR IS ALLOWED TO COME WITH YOU AND STAY IN THE WAITING ROOM ONLY DURING PRE OP AND PROCEDURE DAY OF SURGERY.   TWO VISITOR  MAY VISIT WITH YOU AFTER SURGERY IN YOUR PRIVATE ROOM DURING VISITING HOURS ONLY!  YOU NEED TO HAVE A COVID 19 TEST ON__4-6-22_____ @_______ , THIS TEST MUST BE DONE BEFORE SURGERY,  COVID TESTING SITE   [address] [place] [address],   IT IS ON THE RIGHT GOING OUT [address] APPROXIMATELY  2 MINUTES PAST [employer] ON THE RIGHT. ONCE YOUR COVID TEST IS COMPLETED,  PLEASE BEGIN THE QUARANTINE INSTRUCTIONS AS OUTLINED IN YOUR HANDOUT.                [name]  08/08/2020   Your procedure is scheduled on: 08-23-20   Report to [facility] Main  Entrance   Report to admitting at        0530  AM     Call this number if you have problems the morning of surgery [phone]    Remember:  NO SOLID FOOD AFTER MIDNIGHT THE NIGHT PRIOR TO SURGERY.  NOTHING BY MOUTH EXCEPT CLEAR LIQUIDS UNTIL    0415  Am.   PLEASE FINISH ENSURE DRINK PER SURGEON ORDER  WHICH NEEDS TO BE COMPLETED AT    0488 am then nothing by mouth .   CLEAR LIQUID DIET                                                                    Black Coffee and tea, regular and decaf                             Plain Jell-O any favor except red or purple                                            Fruit ices (not with fruit pulp)                                    Iced Popsicles                                    Carbonated beverages, regular and diet                                    Cranberry, grape and apple juices Sports drinks like Gatorade Lightly seasoned clear broth or consume(fat free) Sugar, honey syrup   _____________________________________________________________________     BRUSH YOUR TEETH MORNING OF SURGERY AND RINSE YOUR MOUTH OUT, NO CHEWING GUM CANDY OR MINTS.     Take these medicines the morning of surgery with  A SIP OF WATER: NONE  DO NOT TAKE ANY DIABETIC MEDICATIONS DAY OF YOUR SURGERY  You may not have any metal on your body including hair pins and              piercings  Do not wear jewelry, make-up, lotions, powders or perfumes, deodorant             Do not wear nail polish on your fingernails.  Do not shave  48 hours prior to surgery.  .   Do not bring valuables to the hospital. [place].  Contacts, dentures or bridgework may not be worn into surgery.       Patients discharged the day of surgery will not be allowed to drive home. IF YOU ARE HAVING SURGERY AND GOING HOME THE SAME DAY, YOU MUST HAVE AN ADULT TO DRIVE YOU HOME AND BE WITH YOU FOR 24 HOURS. YOU MAY GO HOME BY TAXI OR UBER OR ORTHERWISE, BUT AN ADULT MUST ACCOMPANY YOU HOME AND STAY WITH YOU FOR 24 HOURS.  Name and phone number of your driver:  Special Instructions: N/A              Please read over the following fact sheets you were given: _____________________________________________________________________             [facility] - Preparing for Surgery Before surgery, you can play an important role.  Because skin is not sterile, your skin needs to be as free of germs as possible.  You can reduce the number of germs on your skin by washing with CHG (chlorahexidine gluconate) soap before surgery.  CHG is an antiseptic cleaner which kills germs and bonds with the skin to continue killing germs even after washing. Please DO NOT use if you have an allergy to CHG or antibacterial soaps.  If your skin becomes reddened/irritated stop using the CHG and inform your nurse when you arrive at Short Stay. Do not shave (including legs and underarms) for at least 48 hours prior to the first CHG shower.  You may shave your face/neck. Please follow these instructions carefully:  1.  Shower with CHG Soap the night before surgery and the  morning of Surgery.  2.   If you choose to wash your hair, wash your hair first as usual with your  normal  shampoo.  3.  After you shampoo, rinse your hair and body thoroughly to remove the  shampoo.                           4.  Use CHG as you would any other liquid soap.  You can apply chg directly  to the skin and wash                       Gently with a scrungie or clean washcloth.  5.  Apply the CHG Soap to your body ONLY FROM THE NECK DOWN.   Do not use on face/ open                           Wound or open sores. Avoid contact with eyes, ears mouth and genitals (private parts).                       Wash face,  Genitals (private parts) with your normal soap.  6.  Wash thoroughly, paying special attention to the area where your surgery  will be performed.  7.  Thoroughly rinse your body with warm water from the neck down.  8.  DO NOT shower/wash with your normal soap after using and rinsing off  the CHG Soap.                9.  Pat yourself dry with a clean towel.            10.  Wear clean pajamas.            11.  Place clean sheets on your bed the night of your first shower and do not  sleep with pets. Day of Surgery : Do not apply any lotions/deodorants the morning of surgery.  Please wear clean clothes to the hospital/surgery center.  FAILURE TO FOLLOW THESE INSTRUCTIONS MAY RESULT IN THE CANCELLATION OF YOUR SURGERY PATIENT SIGNATURE_________________________________  NURSE SIGNATURE__________________________________  ________________________________________________________________________   Adam Phenix  An incentive spirometer is a tool that can help keep your lungs clear and active. This tool measures how well you are filling your lungs with each breath. Taking long deep breaths may help reverse or decrease the chance of developing breathing (pulmonary) problems (especially infection) following:  A long period of time when you are unable to move or be active. BEFORE THE PROCEDURE    If the spirometer includes an indicator to show your best effort, your nurse or respiratory therapist will set it to a desired goal.  If possible, sit up straight or lean slightly forward. Try not to slouch.  Hold the incentive spirometer in an upright position. INSTRUCTIONS FOR USE  1. Sit on the edge of your bed if possible, or sit up as far as you can in bed or on a chair. 2. Hold the incentive spirometer in an upright position. 3. Breathe out normally. 4. Place the mouthpiece in your mouth and seal your lips tightly around it. 5. Breathe in slowly and as deeply as possible, raising the piston or the ball toward the top of the column. 6. Hold your breath for 3-5 seconds or for as long as possible. Allow the piston or ball to fall to the bottom of the column. 7. Remove the mouthpiece from your mouth and breathe out normally. 8. Rest for a few seconds and repeat Steps 1 through 7 at least 10 times every 1-2 hours when you are awake. Take your time and take a few normal breaths between deep breaths. 9. The spirometer may include an indicator to show your best effort. Use the indicator as a goal to work toward during each repetition. 10. After each set of 10 deep breaths, practice coughing to be sure your lungs are clear. If you have an incision (the cut made at the time of surgery), support your incision when coughing by placing a pillow or rolled up towels firmly against it. Once you are able to get out of bed, walk around indoors and cough well. You may stop using the incentive spirometer when instructed by your caregiver.  RISKS AND COMPLICATIONS  Take your time so you do not get dizzy or light-headed.  If you are in pain, you may need to take or ask for pain medication before doing incentive spirometry. It is harder to take a deep breath if you are having pain. AFTER USE  Rest and breathe slowly and easily.  It can be helpful to keep track of a log of your  progress. Your caregiver  can provide you with a simple table to help with this. If you are using the spirometer at home, follow these instructions: [place] IF:   You are having difficultly using the spirometer.  You have trouble using the spirometer as often as instructed.  Your pain medication is not giving enough relief while using the spirometer.  You develop fever of 100.5 F (38.1 C) or higher. SEEK IMMEDIATE MEDICAL CARE IF:   You cough up bloody sputum that had not been present before.  You develop fever of 102 F (38.9 C) or greater.  You develop worsening pain at or near the incision site. MAKE SURE YOU:   Understand these instructions.  Will watch your condition.  Will get help right away if you are not doing well or get worse. Document Released: 09/14/2006 Document Revised: 07/27/2011 Document Reviewed: 11/15/2006 [facility] Patient Information 2014 [place], [place].   ________________________________________________________________________

## 2020-08-13 ENCOUNTER — Other Ambulatory Visit: Payer: Self-pay | Admitting: Physician Assistant

## 2020-08-13 ENCOUNTER — Ambulatory Visit: Payer: Medicare Other | Admitting: Orthopaedic Surgery

## 2020-08-14 ENCOUNTER — Encounter (HOSPITAL_COMMUNITY): Payer: Self-pay

## 2020-08-14 ENCOUNTER — Encounter (HOSPITAL_COMMUNITY)
Admission: RE | Admit: 2020-08-14 | Discharge: 2020-08-14 | Disposition: A | Discharge status: Home / Self Care | Payer: Medicare Other | Source: Ambulatory Visit | Attending: Orthopaedic Surgery | Admitting: Orthopaedic Surgery

## 2020-08-14 ENCOUNTER — Other Ambulatory Visit: Payer: Self-pay

## 2020-08-14 DIAGNOSIS — Z01812 Encounter for preprocedural laboratory examination: Secondary | ICD-10-CM | POA: Insufficient documentation

## 2020-08-14 HISTORY — DX: Cardiac murmur, unspecified: R01.1

## 2020-08-14 HISTORY — DX: Other complications of anesthesia, initial encounter: T88.59XA

## 2020-08-14 LAB — BASIC METABOLIC PANEL
Anion gap: 9 (ref 5–15)
BUN: 8 mg/dL (ref 8–23)
CO2: 26 mmol/L (ref 22–32)
Calcium: 9.5 mg/dL (ref 8.9–10.3)
Chloride: 104 mmol/L (ref 98–111)
Creatinine, Ser: 0.52 mg/dL (ref 0.44–1.00)
GFR, Estimated: >60 mL/min (ref >60)
Glucose, Bld: 82 mg/dL (ref 70–99)
Potassium: 4.4 mmol/L (ref 3.5–5.1)
Sodium: 139 mmol/L (ref 135–145)

## 2020-08-14 LAB — CBC
HCT: 45.6 % (ref 36.0–46.0)
Hemoglobin: 15 g/dL (ref 12.0–15.0)
MCH: 29.9 pg (ref 26.0–34.0)
MCHC: 32.9 g/dL (ref 30.0–36.0)
MCV: 90.8 fL (ref 80.0–100.0)
Platelets: 186 10*3/uL (ref 150–400)
RBC: 5.02 MIL/uL (ref 3.87–5.11)
RDW: 12.7 % (ref 11.5–15.5)
WBC: 6.3 10*3/uL (ref 4.0–10.5)
nRBC: 0 % (ref 0.0–0.2)

## 2020-08-14 LAB — SURGICAL PCR SCREEN
MRSA, PCR: NEGATIVE
Staphylococcus aureus: NEGATIVE

## 2020-08-15 ENCOUNTER — Telehealth: Payer: Self-pay | Admitting: *Deleted

## 2020-08-15 ENCOUNTER — Encounter: Payer: Self-pay | Admitting: Orthopaedic Surgery

## 2020-08-15 ENCOUNTER — Ambulatory Visit (INDEPENDENT_AMBULATORY_CARE_PROVIDER_SITE_OTHER): Payer: Medicare Other | Admitting: Orthopaedic Surgery

## 2020-08-15 VITALS — Ht 66.5 in

## 2020-08-15 DIAGNOSIS — M1612 Unilateral primary osteoarthritis, left hip: Secondary | ICD-10-CM | POA: Diagnosis not present

## 2020-08-15 NOTE — H&P (View-Only) (Signed)
The patient is scheduled for total hip arthroplasty next Friday, April 8.  This is for a left hip replacement.  She comes in today for a weight and BMI calculation.  She does ambulate with a rolling walker.  Today's weight is 244 pounds with a BMI of 38.79.  She had a lot of appropriate questions as it pertains to hip replacement surgery and her situation in general.  I was able to happily answer all of these questions for her.  We talked in detail about the risk and benefits of this type of surgery.  I have had several discussions over last several visits with her about hip replacement surgery.  We will see her next Friday for surgery.  All questions and concerns were answered and addressed.

## 2020-08-15 NOTE — Care Plan (Signed)
RNCM met with patient in office today for her upcoming Left total hip arthroplasty on 08/23/20 with Dr. Ninfa Linden. She is an Ortho bundle patient through [facility] and is agreeable to case management. She will have family assisting after discharge home (Sister and Son). She will need a FWW as well as 3in1/BSC. These will be ordered through [facility] to be delivered to her hospital room prior to discharge. Reviewed all post-op care instructions and provided copy for her review. Anticipate HHPT will be needed after short hospital stay. Patient requested [facility] for therapy. Will make referral. Will continue to follow for needs.

## 2020-08-15 NOTE — Progress Notes (Signed)
The patient is scheduled for total hip arthroplasty next Friday, April 8.  This is for a left hip replacement.  She comes in today for a weight and BMI calculation.  She does ambulate with a rolling walker.  Today's weight is 244 pounds with a BMI of 38.79.  She had a lot of appropriate questions as it pertains to hip replacement surgery and her situation in general.  I was able to happily answer all of these questions for her.  We talked in detail about the risk and benefits of this type of surgery.  I have had several discussions over last several visits with her about hip replacement surgery.  We will see her next Friday for surgery.  All questions and concerns were answered and addressed.

## 2020-08-15 NOTE — Telephone Encounter (Signed)
Ortho bundle pre-op call completed. 

## 2020-08-19 ENCOUNTER — Telehealth: Payer: Self-pay | Admitting: Family Medicine

## 2020-08-19 NOTE — Telephone Encounter (Signed)
Pt called in stating that she is having a total hip replacement on 08/23/20. She wanted to make Dr. Birdie Riddle aware of this.

## 2020-08-19 NOTE — Telephone Encounter (Signed)
FYI total Hip replacement scheduled 08/23/2020

## 2020-08-19 NOTE — Telephone Encounter (Signed)
Best of luck!  I will follow along.

## 2020-08-21 ENCOUNTER — Other Ambulatory Visit (HOSPITAL_COMMUNITY)
Admission: RE | Admit: 2020-08-21 | Discharge: 2020-08-21 | Disposition: A | Discharge status: Home / Self Care | Payer: Medicare Other | Source: Ambulatory Visit | Attending: Orthopaedic Surgery | Admitting: Orthopaedic Surgery

## 2020-08-21 DIAGNOSIS — Z20822 Contact with and (suspected) exposure to covid-19: Secondary | ICD-10-CM | POA: Insufficient documentation

## 2020-08-21 DIAGNOSIS — Z01812 Encounter for preprocedural laboratory examination: Secondary | ICD-10-CM | POA: Insufficient documentation

## 2020-08-21 LAB — SARS CORONAVIRUS 2 (TAT 6-24 HRS): SARS Coronavirus 2: NEGATIVE

## 2020-08-22 ENCOUNTER — Encounter (HOSPITAL_COMMUNITY): Payer: Self-pay | Admitting: Orthopaedic Surgery
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
## 2020-08-23 ENCOUNTER — Other Ambulatory Visit: Payer: Self-pay

## 2020-08-23 ENCOUNTER — Ambulatory Visit (HOSPITAL_COMMUNITY): Payer: Medicare Other | Admitting: Anesthesiology

## 2020-08-23 ENCOUNTER — Encounter (HOSPITAL_COMMUNITY): Payer: Self-pay | Admitting: Orthopaedic Surgery

## 2020-08-23 ENCOUNTER — Inpatient Hospital Stay (HOSPITAL_COMMUNITY)
Admission: RE | Admit: 2020-08-23 | Discharge: 2020-08-26 | DRG: 470 | Disposition: A | Discharge status: Home / Self Care | Payer: Medicare Other | Attending: Orthopaedic Surgery | Admitting: Orthopaedic Surgery

## 2020-08-23 ENCOUNTER — Ambulatory Visit (HOSPITAL_COMMUNITY): Payer: Medicare Other | Admitting: Physician Assistant

## 2020-08-23 ENCOUNTER — Observation Stay (HOSPITAL_COMMUNITY): Payer: Medicare Other

## 2020-08-23 ENCOUNTER — Encounter (HOSPITAL_COMMUNITY)
Admission: RE | Disposition: A | Discharge status: Home / Self Care | Payer: Self-pay | Source: Home / Self Care | Attending: Orthopaedic Surgery

## 2020-08-23 ENCOUNTER — Ambulatory Visit (HOSPITAL_COMMUNITY): Payer: Medicare Other

## 2020-08-23 DIAGNOSIS — Z886 Allergy status to analgesic agent status: Secondary | ICD-10-CM

## 2020-08-23 DIAGNOSIS — I350 Nonrheumatic aortic (valve) stenosis: Secondary | ICD-10-CM | POA: Diagnosis not present

## 2020-08-23 DIAGNOSIS — Z96642 Presence of left artificial hip joint: Secondary | ICD-10-CM

## 2020-08-23 DIAGNOSIS — Z96653 Presence of artificial knee joint, bilateral: Secondary | ICD-10-CM | POA: Diagnosis not present

## 2020-08-23 DIAGNOSIS — M1612 Unilateral primary osteoarthritis, left hip: Secondary | ICD-10-CM | POA: Diagnosis not present

## 2020-08-23 DIAGNOSIS — Z981 Arthrodesis status: Secondary | ICD-10-CM

## 2020-08-23 DIAGNOSIS — M859 Disorder of bone density and structure, unspecified: Secondary | ICD-10-CM | POA: Diagnosis not present

## 2020-08-23 DIAGNOSIS — Z8249 Family history of ischemic heart disease and other diseases of the circulatory system: Secondary | ICD-10-CM | POA: Diagnosis not present

## 2020-08-23 DIAGNOSIS — Z6838 Body mass index (BMI) 38.0-38.9, adult: Secondary | ICD-10-CM

## 2020-08-23 DIAGNOSIS — M8580 Other specified disorders of bone density and structure, unspecified site: Secondary | ICD-10-CM | POA: Diagnosis not present

## 2020-08-23 DIAGNOSIS — E559 Vitamin D deficiency, unspecified: Secondary | ICD-10-CM | POA: Diagnosis not present

## 2020-08-23 DIAGNOSIS — E669 Obesity, unspecified: Secondary | ICD-10-CM | POA: Diagnosis present

## 2020-08-23 DIAGNOSIS — Z79899 Other long term (current) drug therapy: Secondary | ICD-10-CM

## 2020-08-23 DIAGNOSIS — M797 Fibromyalgia: Secondary | ICD-10-CM | POA: Diagnosis not present

## 2020-08-23 DIAGNOSIS — Z419 Encounter for procedure for purposes other than remedying health state, unspecified: Secondary | ICD-10-CM

## 2020-08-23 DIAGNOSIS — Z471 Aftercare following joint replacement surgery: Secondary | ICD-10-CM | POA: Diagnosis not present

## 2020-08-23 HISTORY — PX: TOTAL HIP ARTHROPLASTY: SHX124

## 2020-08-23 LAB — TYPE AND SCREEN
ABO/RH(D): O POS
Antibody Screen: NEGATIVE

## 2020-08-23 LAB — ABO/RH: ABO/RH(D): O POS

## 2020-08-23 SURGERY — ARTHROPLASTY, HIP, TOTAL, ANTERIOR APPROACH
Anesthesia: Monitor Anesthesia Care | Site: Hip | Laterality: Left

## 2020-08-23 MED ORDER — FENTANYL CITRATE (PF) 100 MCG/2ML IJ SOLN
INTRAMUSCULAR | Status: DC | PRN
  Administered 2020-08-23 (×2): 50 ug via INTRAVENOUS

## 2020-08-23 MED ORDER — LACTATED RINGERS IV SOLN: INTRAVENOUS | Status: DC

## 2020-08-23 MED ORDER — ORAL CARE MOUTH RINSE: 15.0000 mL | Freq: Once | OROMUCOSAL | Status: AC

## 2020-08-23 MED ORDER — PROPOFOL 10 MG/ML IV BOLUS
INTRAVENOUS | Status: AC
  Filled 2020-08-23: qty 40

## 2020-08-23 MED ORDER — ONDANSETRON HCL 4 MG/2ML IJ SOLN
INTRAMUSCULAR | Status: AC
  Filled 2020-08-23: qty 6

## 2020-08-23 MED ORDER — LIDOCAINE 2% (20 MG/ML) 5 ML SYRINGE
INTRAMUSCULAR | Status: AC
  Filled 2020-08-23: qty 5

## 2020-08-23 MED ORDER — METOCLOPRAMIDE HCL 5 MG/ML IJ SOLN: 5.0000 mg | Freq: Three times a day (TID) | INTRAMUSCULAR | Status: DC | PRN

## 2020-08-23 MED ORDER — STERILE WATER FOR IRRIGATION IR SOLN
Status: DC | PRN
  Administered 2020-08-23: 2000 mL

## 2020-08-23 MED ORDER — METOCLOPRAMIDE HCL 5 MG PO TABS: 5.0000 mg | ORAL_TABLET | Freq: Three times a day (TID) | ORAL | Status: DC | PRN

## 2020-08-23 MED ORDER — OXYCODONE HCL 5 MG PO TABS
5.0000 mg | ORAL_TABLET | ORAL | Status: DC | PRN
  Filled 2020-08-23: qty 2

## 2020-08-23 MED ORDER — PHENYLEPHRINE HCL-NACL 10-0.9 MG/250ML-% IV SOLN
INTRAVENOUS | Status: DC | PRN
  Administered 2020-08-23: 35 ug/min via INTRAVENOUS

## 2020-08-23 MED ORDER — CHLORHEXIDINE GLUCONATE 0.12 % MT SOLN
15.0000 mL | Freq: Once | OROMUCOSAL | Status: AC
  Administered 2020-08-23: 15 mL via OROMUCOSAL

## 2020-08-23 MED ORDER — BUPIVACAINE HCL (PF) 0.5 % IJ SOLN
INTRAMUSCULAR | Status: AC
  Filled 2020-08-23: qty 30

## 2020-08-23 MED ORDER — PHENOL 1.4 % MT LIQD: 1.0000 | OROMUCOSAL | Status: DC | PRN

## 2020-08-23 MED ORDER — PHENYLEPHRINE 40 MCG/ML (10ML) SYRINGE FOR IV PUSH (FOR BLOOD PRESSURE SUPPORT): PREFILLED_SYRINGE | INTRAVENOUS | Status: DC | PRN

## 2020-08-23 MED ORDER — SODIUM CHLORIDE 0.9 % IV SOLN: INTRAVENOUS | Status: DC

## 2020-08-23 MED ORDER — DEXAMETHASONE SODIUM PHOSPHATE 10 MG/ML IJ SOLN
INTRAMUSCULAR | Status: AC
  Filled 2020-08-23: qty 3

## 2020-08-23 MED ORDER — DIPHENHYDRAMINE HCL 12.5 MG/5ML PO ELIX: 12.5000 mg | ORAL_SOLUTION | ORAL | Status: DC | PRN

## 2020-08-23 MED ORDER — HYDROMORPHONE HCL 1 MG/ML IJ SOLN: 0.5000 mg | INTRAMUSCULAR | Status: DC | PRN

## 2020-08-23 MED ORDER — POVIDONE-IODINE 10 % EX SWAB
2.0000 "application " | Freq: Once | CUTANEOUS | Status: AC
  Administered 2020-08-23: 2 via TOPICAL

## 2020-08-23 MED ORDER — PROPOFOL 1000 MG/100ML IV EMUL
INTRAVENOUS | Status: AC
  Filled 2020-08-23: qty 100

## 2020-08-23 MED ORDER — METHOCARBAMOL 500 MG PO TABS
500.0000 mg | ORAL_TABLET | Freq: Four times a day (QID) | ORAL | Status: DC | PRN
  Administered 2020-08-24 – 2020-08-26 (×6): 500 mg via ORAL
  Filled 2020-08-23 (×6): qty 1

## 2020-08-23 MED ORDER — SODIUM CHLORIDE 0.9 % IR SOLN
Status: DC | PRN
  Administered 2020-08-23: 1000 mL

## 2020-08-23 MED ORDER — FENTANYL CITRATE (PF) 100 MCG/2ML IJ SOLN: 25.0000 ug | INTRAMUSCULAR | Status: DC | PRN

## 2020-08-23 MED ORDER — METHOCARBAMOL 1000 MG/10ML IJ SOLN
500.0000 mg | Freq: Four times a day (QID) | INTRAVENOUS | Status: DC | PRN
  Administered 2020-08-23: 500 mg via INTRAVENOUS
  Filled 2020-08-23: qty 500
  Filled 2020-08-23: qty 5

## 2020-08-23 MED ORDER — ALUM & MAG HYDROXIDE-SIMETH 200-200-20 MG/5ML PO SUSP: 30.0000 mL | ORAL | Status: DC | PRN

## 2020-08-23 MED ORDER — DOCUSATE SODIUM 100 MG PO CAPS
100.0000 mg | ORAL_CAPSULE | Freq: Two times a day (BID) | ORAL | Status: DC
  Administered 2020-08-24 – 2020-08-26 (×5): 100 mg via ORAL
  Filled 2020-08-23 (×5): qty 1

## 2020-08-23 MED ORDER — LIDOCAINE 2% (20 MG/ML) 5 ML SYRINGE
INTRAMUSCULAR | Status: DC | PRN
  Administered 2020-08-23: 50 mg via INTRAVENOUS

## 2020-08-23 MED ORDER — MIDAZOLAM HCL 5 MG/5ML IJ SOLN
INTRAMUSCULAR | Status: DC | PRN
  Administered 2020-08-23 (×2): 1 mg via INTRAVENOUS

## 2020-08-23 MED ORDER — TRANEXAMIC ACID-NACL 1000-0.7 MG/100ML-% IV SOLN
1000.0000 mg | INTRAVENOUS | Status: AC
  Administered 2020-08-23: 1000 mg via INTRAVENOUS
  Filled 2020-08-23: qty 100

## 2020-08-23 MED ORDER — ONDANSETRON HCL 4 MG/2ML IJ SOLN
4.0000 mg | Freq: Four times a day (QID) | INTRAMUSCULAR | Status: DC | PRN
  Administered 2020-08-23: 4 mg via INTRAVENOUS
  Filled 2020-08-23: qty 2

## 2020-08-23 MED ORDER — OXYCODONE HCL 5 MG PO TABS: 5.0000 mg | ORAL_TABLET | Freq: Once | ORAL | Status: DC | PRN

## 2020-08-23 MED ORDER — MIDAZOLAM HCL 2 MG/2ML IJ SOLN
INTRAMUSCULAR | Status: AC
  Filled 2020-08-23: qty 2

## 2020-08-23 MED ORDER — PHENYLEPHRINE HCL-NACL 10-0.9 MG/250ML-% IV SOLN
INTRAVENOUS | Status: AC
  Filled 2020-08-23: qty 750

## 2020-08-23 MED ORDER — PROPOFOL 500 MG/50ML IV EMUL
INTRAVENOUS | Status: DC | PRN
  Administered 2020-08-23: 50 ug/kg/min via INTRAVENOUS

## 2020-08-23 MED ORDER — ACETAMINOPHEN 325 MG PO TABS
325.0000 mg | ORAL_TABLET | Freq: Four times a day (QID) | ORAL | Status: DC | PRN
  Administered 2020-08-24 – 2020-08-26 (×4): 650 mg via ORAL
  Filled 2020-08-23 (×4): qty 2

## 2020-08-23 MED ORDER — CEFAZOLIN SODIUM-DEXTROSE 1-4 GM/50ML-% IV SOLN
1.0000 g | Freq: Four times a day (QID) | INTRAVENOUS | Status: AC
  Administered 2020-08-23 (×2): 1 g via INTRAVENOUS
  Filled 2020-08-23 (×2): qty 50

## 2020-08-23 MED ORDER — BUPIVACAINE IN DEXTROSE 0.75-8.25 % IT SOLN
INTRATHECAL | Status: DC | PRN
  Administered 2020-08-23: 2 mL via INTRATHECAL

## 2020-08-23 MED ORDER — APIXABAN 2.5 MG PO TABS
2.5000 mg | ORAL_TABLET | Freq: Two times a day (BID) | ORAL | Status: DC
  Administered 2020-08-24 – 2020-08-26 (×5): 2.5 mg via ORAL
  Filled 2020-08-23 (×5): qty 1

## 2020-08-23 MED ORDER — CEFAZOLIN SODIUM-DEXTROSE 2-4 GM/100ML-% IV SOLN
2.0000 g | INTRAVENOUS | Status: AC
  Administered 2020-08-23: 2 g via INTRAVENOUS
  Filled 2020-08-23: qty 100

## 2020-08-23 MED ORDER — PANTOPRAZOLE SODIUM 40 MG PO TBEC
40.0000 mg | DELAYED_RELEASE_TABLET | Freq: Every day | ORAL | Status: DC
  Administered 2020-08-24 – 2020-08-26 (×3): 40 mg via ORAL
  Filled 2020-08-23 (×3): qty 1

## 2020-08-23 MED ORDER — EPHEDRINE 5 MG/ML INJ
INTRAVENOUS | Status: AC
  Filled 2020-08-23: qty 10

## 2020-08-23 MED ORDER — 0.9 % SODIUM CHLORIDE (POUR BTL) OPTIME
TOPICAL | Status: DC | PRN
  Administered 2020-08-23: 1000 mL

## 2020-08-23 MED ORDER — ONDANSETRON HCL 4 MG/2ML IJ SOLN: 4.0000 mg | Freq: Once | INTRAMUSCULAR | Status: DC | PRN

## 2020-08-23 MED ORDER — OXYCODONE HCL 5 MG/5ML PO SOLN: 5.0000 mg | Freq: Once | ORAL | Status: DC | PRN

## 2020-08-23 MED ORDER — OXYCODONE HCL 5 MG PO TABS
10.0000 mg | ORAL_TABLET | ORAL | Status: DC | PRN
  Administered 2020-08-23: 15 mg via ORAL
  Administered 2020-08-23: 10 mg via ORAL
  Filled 2020-08-23: qty 3

## 2020-08-23 MED ORDER — FENTANYL CITRATE (PF) 100 MCG/2ML IJ SOLN
INTRAMUSCULAR | Status: AC
  Filled 2020-08-23: qty 2

## 2020-08-23 MED ORDER — ONDANSETRON HCL 4 MG/2ML IJ SOLN
INTRAMUSCULAR | Status: DC | PRN
  Administered 2020-08-23: 4 mg via INTRAVENOUS

## 2020-08-23 MED ORDER — ONDANSETRON HCL 4 MG PO TABS
4.0000 mg | ORAL_TABLET | Freq: Four times a day (QID) | ORAL | Status: DC | PRN
  Administered 2020-08-24 (×2): 4 mg via ORAL
  Filled 2020-08-23 (×2): qty 1

## 2020-08-23 MED ORDER — HYDROCODONE-ACETAMINOPHEN 5-325 MG PO TABS
1.0000 | ORAL_TABLET | ORAL | Status: DC | PRN
  Administered 2020-08-24 – 2020-08-26 (×9): 1 via ORAL
  Filled 2020-08-23 (×9): qty 1

## 2020-08-23 MED ORDER — PROPOFOL 10 MG/ML IV BOLUS
INTRAVENOUS | Status: DC | PRN
  Administered 2020-08-23 (×2): 10 mg via INTRAVENOUS

## 2020-08-23 MED ORDER — MENTHOL 3 MG MT LOZG: 1.0000 | LOZENGE | OROMUCOSAL | Status: DC | PRN

## 2020-08-23 MED ORDER — EPHEDRINE SULFATE 50 MG/ML IJ SOLN
INTRAMUSCULAR | Status: DC | PRN
  Administered 2020-08-23: 7 mg via INTRAVENOUS
  Administered 2020-08-23 (×2): 5 mg via INTRAVENOUS

## 2020-08-23 MED ORDER — DEXAMETHASONE SODIUM PHOSPHATE 10 MG/ML IJ SOLN
INTRAMUSCULAR | Status: DC | PRN
  Administered 2020-08-23: 8 mg via INTRAVENOUS

## 2020-08-23 SURGICAL SUPPLY — 40 items
APL SKNCLS STERI-STRIP NONHPOA (GAUZE/BANDAGES/DRESSINGS)
BAG SPEC THK2 15X12 ZIP CLS (MISCELLANEOUS)
BAG ZIPLOCK 12X15 (MISCELLANEOUS) IMPLANT
BENZOIN TINCTURE PRP APPL 2/3 (GAUZE/BANDAGES/DRESSINGS) IMPLANT
BLADE SAW SGTL 18X1.27X75 (BLADE) ×2 IMPLANT
COVER PERINEAL POST (MISCELLANEOUS) ×2 IMPLANT
COVER SURGICAL LIGHT HANDLE (MISCELLANEOUS) ×2 IMPLANT
COVER WAND RF STERILE (DRAPES) ×1 IMPLANT
DRAPE STERI IOBAN 125X83 (DRAPES) ×2 IMPLANT
DRAPE U-SHAPE 47X51 STRL (DRAPES) ×4 IMPLANT
DRSG AQUACEL AG ADV 3.5X10 (GAUZE/BANDAGES/DRESSINGS) ×2 IMPLANT
DURAPREP 26ML APPLICATOR (WOUND CARE) ×2 IMPLANT
ELECT REM PT RETURN 15FT ADLT (MISCELLANEOUS) ×2 IMPLANT
GAUZE XEROFORM 1X8 LF (GAUZE/BANDAGES/DRESSINGS) ×2 IMPLANT
GLOVE SRG 8 PF TXTR STRL LF DI (GLOVE) ×2 IMPLANT
GLOVE SURG ENC MOIS LTX SZ7.5 (GLOVE) ×2 IMPLANT
GLOVE SURG LTX SZ8 (GLOVE) ×2 IMPLANT
GLOVE SURG UNDER POLY LF SZ8 (GLOVE) ×4
GOWN STRL REUS W/TWL XL LVL3 (GOWN DISPOSABLE) ×4 IMPLANT
HANDPIECE INTERPULSE COAX TIP (DISPOSABLE) ×2
HEAD M SROM 36MM PLUS 1.5 (Hips) IMPLANT
HOLDER FOLEY CATH W/STRAP (MISCELLANEOUS) ×2 IMPLANT
KIT TURNOVER KIT A (KITS) ×2 IMPLANT
LINER ACETAB NEUTRAL 36ID 520D (Liner) ×1 IMPLANT
PACK ANTERIOR HIP CUSTOM (KITS) ×2 IMPLANT
PENCIL SMOKE EVACUATOR (MISCELLANEOUS) IMPLANT
PIN SECTOR W/GRIP ACE CUP 52MM (Hips) ×1 IMPLANT
SET HNDPC FAN SPRY TIP SCT (DISPOSABLE) ×1 IMPLANT
SROM M HEAD 36MM PLUS 1.5 (Hips) ×2 IMPLANT
STAPLER VISISTAT 35W (STAPLE) ×1 IMPLANT
STEM CORAIL HC SZ14 135 (Stem) ×1 IMPLANT
STRIP CLOSURE SKIN 1/2X4 (GAUZE/BANDAGES/DRESSINGS) IMPLANT
SUT ETHIBOND NAB CT1 #1 30IN (SUTURE) ×2 IMPLANT
SUT ETHILON 2 0 PS N (SUTURE) ×1 IMPLANT
SUT MNCRL AB 4-0 PS2 18 (SUTURE) IMPLANT
SUT VIC AB 0 CT1 36 (SUTURE) ×2 IMPLANT
SUT VIC AB 1 CT1 36 (SUTURE) ×2 IMPLANT
SUT VIC AB 2-0 CT1 27 (SUTURE) ×4
SUT VIC AB 2-0 CT1 TAPERPNT 27 (SUTURE) ×2 IMPLANT
TRAY FOLEY MTR SLVR 14FR STAT (SET/KITS/TRAYS/PACK) ×1 IMPLANT

## 2020-08-23 NOTE — Evaluation (Signed)
Physical Therapy Evaluation Patient Details Name: [name] MRN: [id number] DOB: 23-Jul-1950 Today's Date: 08/23/2020   History of Present Illness  Patient is 70 y.o. female s/p Lt THA anterior approach on 08/23/20 with PMH significant for OA, fibromyalgia, obesity, bil TKA.    Clinical Impression  [name] is a 70 y.o. female POD 0 s/p Lt THA. Patient reports indepedence with mobility at baseline. Patient is now limited by functional impairments (see PT problem list below) and requires min assist for transfers and gait with RW. Patient was able to ambulate ~7 feet with RW and min assist; distance limited by pain. Patient instructed in exercise to facilitate circulation to manage edema; pt wiggling Lt toes 2/2 ankle fixation. Patient will benefit from continued skilled PT interventions to address impairments and progress towards PLOF. Acute PT will follow to progress mobility and stair training in preparation for safe discharge home.     Follow Up Recommendations Follow surgeon's recommendation for DC plan and follow-up therapies;Home health PT    Equipment Recommendations  Rolling walker with 5" wheels;3in1 (PT)    Recommendations for Other Services       Precautions / Restrictions Precautions Precautions: Fall Restrictions Weight Bearing Restrictions: No      Mobility  Bed Mobility Overal bed mobility: Needs Assistance Bed Mobility: Supine to Sit     Supine to sit: Min assist;HOB elevated     General bed mobility comments: cues for sequencing use of bed rail and assist to bring Lt LE off EOB. min assist with bed pad to scoot to EOB.    Transfers Overall transfer level: Needs assistance Equipment used: Rolling walker (2 wheeled) Transfers: Sit to/from Stand Sit to Stand: Min assist;From elevated surface         General transfer comment: cues required for safe hand placement for power up, pt took 3 rocks to initiate power up and min assist needed to rise. no pt  steady once standing.  Ambulation/Gait Ambulation/Gait assistance: Min assist Gait Distance (Feet): 7 Feet Assistive device: Rolling walker (2 wheeled) Gait Pattern/deviations: Decreased stride length;Step-to pattern;Decreased step length - left;Decreased weight shift to left Gait velocity: decr   General Gait Details: distance limited by hip pain with Lt LE advancement. cues required for step pattern and to maintain safe proximity to RW.  Stairs            Wheelchair Mobility    Modified Rankin (Stroke Patients Only)       Balance Overall balance assessment: Needs assistance Sitting-balance support: Feet supported;Bilateral upper extremity supported;Single extremity supported Sitting balance-Leahy Scale: Fair     Standing balance support: During functional activity;Bilateral upper extremity supported Standing balance-Leahy Scale: Poor                               Pertinent Vitals/Pain Pain Assessment: 0-10 Pain Score: 5  Pain Location: Lt hip Pain Descriptors / Indicators: Aching Pain Intervention(s): Limited activity within patient's tolerance;Monitored during session;Repositioned;Ice applied    Home Living Family/patient expects to be discharged to:: Private residence Living Arrangements: Other relatives (twin sister) Available Help at Discharge: Family Type of Home: House Home Access: Stairs to enter Entrance Stairs-Rails: None Entrance Stairs-Number of Steps: 1 Home Layout: One level Home Equipment: Walker - 4 wheels;Grab bars - toilet;Toilet riser      Prior Function Level of Independence: Independent  Hand Dominance   Dominant Hand: Right    Extremity/Trunk Assessment   Upper Extremity Assessment Upper Extremity Assessment: Overall WFL for tasks assessed    Lower Extremity Assessment Lower Extremity Assessment: LLE deficits/detail LLE Deficits / Details: good quad activation, 4/5 for ankle dorsi/plantar  flexion. LLE Sensation: WNL LLE Coordination: WNL    Cervical / Trunk Assessment Cervical / Trunk Assessment: Other exceptions Cervical / Trunk Exceptions: habitus  Communication   Communication: No difficulties  Cognition Arousal/Alertness: Awake/alert Behavior During Therapy: WFL for tasks assessed/performed Overall Cognitive Status: Within Functional Limits for tasks assessed                                        General Comments General comments (skin integrity, edema, etc.): pt with fused Lt ankle    Exercises Total Joint Exercises Ankle Circles/Pumps: AROM;Right;20 reps;Seated (Lt toes moving)   Assessment/Plan    PT Assessment Patient needs continued PT services  PT Problem List Decreased strength;Decreased range of motion;Decreased activity tolerance;Decreased balance;Decreased mobility;Decreased knowledge of use of DME;Decreased knowledge of precautions;Pain       PT Treatment Interventions DME instruction;Gait training;Stair training;Functional mobility training;Therapeutic activities;Therapeutic exercise;Balance training;Patient/family education    PT Goals (Current goals can be found in the Care Plan section)  Acute Rehab PT Goals Patient Stated Goal: get back home and start bulding up strength PT Goal Formulation: With patient Time For Goal Achievement: 08/30/20 Potential to Achieve Goals: Good    Frequency 7X/week   Barriers to discharge        Co-evaluation               AM-PAC PT "6 Clicks" Mobility  Outcome Measure Help needed turning from your back to your side while in a flat bed without using bedrails?: None Help needed moving from lying on your back to sitting on the side of a flat bed without using bedrails?: [name] needed moving to and from a bed to a chair (including a wheelchair)?: [name] needed standing up from a chair using your arms (e.g., wheelchair or bedside chair)?: [name] needed to walk  in hospital room?: [name] needed climbing 3-5 steps with a railing? : A Little 6 Click Score: 19    End of Session Equipment Utilized During Treatment: Gait belt Activity Tolerance: Patient tolerated treatment well Patient left: with call bell/phone within reach;in chair;with chair alarm set Nurse Communication: Mobility status PT Visit Diagnosis: Muscle weakness (generalized) (M62.81);Difficulty in walking, not elsewhere classified (R26.2)    Time: 2426-8341 PT Time Calculation (min) (ACUTE ONLY): 23 min   Charges:   PT Evaluation $PT Eval Low Complexity: 1 Low PT Treatments $Gait Training: 8-22 mins        Verner Mould, DPT Acute Rehabilitation Services Office [phone] Pager [phone]    Jacques Navy 08/23/2020, 2:15 PM

## 2020-08-23 NOTE — Anesthesia Postprocedure Evaluation (Signed)
Anesthesia Post Note  Patient: [name]  Procedure(s) Performed: LEFT TOTAL HIP ARTHROPLASTY ANTERIOR APPROACH (Left Hip)     Patient location during evaluation: PACU Anesthesia Type: MAC Level of consciousness: awake and alert Pain management: pain level controlled Vital Signs Assessment: post-procedure vital signs reviewed and stable Respiratory status: spontaneous breathing, nonlabored ventilation, respiratory function stable and patient connected to nasal cannula oxygen Cardiovascular status: blood pressure returned to baseline and stable Postop Assessment: no apparent nausea or vomiting Anesthetic complications: no   No complications documented.  Last Vitals:  Vitals:   08/23/20 1202 08/23/20 1624  BP: 135/83 122/79  Pulse: 95 93  Resp: 18 17  Temp: 36.4 C 36.7 C  SpO2: 100% 93%    Last Pain:  Vitals:   08/23/20 1649  TempSrc:   PainSc: 4                  Mei Suits COKER

## 2020-08-23 NOTE — Care Plan (Signed)
Ortho Bundle Case Management Note  Patient Details  Name: MELANY WIESMAN MRN: [id number] Date of Birth: 1951-04-18     [facility] met with patient in office prior to her upcoming Left total hip arthroplasty on 08/23/20 with Dr. Ninfa Linden. She is an Ortho bundle patient through [facility] and is agreeable to case management. She will have family assisting after discharge home (Sister and Son). She will need a FWW as well as 3in1/BSC. These will be ordered through [employer] to be delivered to her hospital room prior to discharge. Reviewed all post-op care instructions and provided copy for her review. Anticipate HHPT will be needed after short hospital stay. Patient requested [place] for therapy. Referral made to liaison, Donella Stade prior to surgery. Will continue to follow for needs.                  DME Arranged:  3-N-1,Walker rolling DME Agency:  [employer]  HH Arranged:  PT [place] Agency:  [facility] ([place]) (Referral and order given to Donella Stade with [facility] prior to surgery.)  Additional Comments: Please contact me with any questions of if this plan should need to change.  Jamse Arn, RN, BSN, [employer]  [phone] 08/23/2020, 10:29 AM

## 2020-08-23 NOTE — Interval H&P Note (Signed)
History and Physical Interval Note: Patient understands fully that she is here today for a left total hip replacement to treat her left hip osteoarthritis.  There is been no acute or interval change in her medical status.  See recent H&P.  The risks and benefits of surgery have been explained in detail and informed consent is obtained.  The left hip has been marked.  08/23/2020 7:04 AM  Alanson Aly  has presented today for surgery, with the diagnosis of osteoarthritis left hip.  The various methods of treatment have been discussed with the patient and family. After consideration of risks, benefits and other options for treatment, the patient has consented to  Procedure(s): LEFT TOTAL HIP ARTHROPLASTY ANTERIOR APPROACH (Left) as a surgical intervention.  The patient's history has been reviewed, patient examined, no change in status, stable for surgery.  I have reviewed the patient's chart and labs.  Questions were answered to the patient's satisfaction.     Mcarthur Rossetti

## 2020-08-23 NOTE — Op Note (Signed)
[name], [name] MEDICAL RECORD NO: [id number] ACCOUNT NO: [id number] DATE OF BIRTH: 1951/04/09 FACILITY: [facility]: [id number] PHYSICIAN: Lind Guest. Ninfa Linden, MD  Operative Report   DATE OF PROCEDURE: 08/23/2020   PREOPERATIVE DIAGNOSIS:  Primary osteoarthritis and degenerative joint disease, left hip.  POSTOPERATIVE DIAGNOSIS:  Primary osteoarthritis and degenerative joint disease, left hip.  PROCEDURE:  Left total hip arthroplasty through direct anterior approach.  IMPLANTS:  DePuy sector Gription acetabular component size 52, size 36+0 neutral polyethylene liner, size 14 Corail femoral component with high offset, size 36+1.5 metal hip ball.  SURGEON:  Jean Rosenthal, M.D.  ASSISTANT:  Erskine Emery, PA-C .  ANESTHESIA:  Spinal.  ANTIBIOTICS:  2 g IV Ancef.  ESTIMATED BLOOD LOSS:  465 mL  COMPLICATIONS:  None.  INDICATIONS:  The patient is a 70 year old female who used to have a BMI of over 40.  She is now down to below 40 and has severe debilitating arthritis involving her left hip.  She has had bilateral knee replacements and unfortunately, has a left ankle  fusion from previous trauma.  Her left hip pain is well documented in terms of the pain she has been having, an x-ray showing significant and severe arthritis.  The hip was very stiff.  At this point, it is detrimentally affecting her mobility, her  quality of life and her activities of daily living to the point she does wish to proceed with a total hip arthroplasty.  We talked about the risk of acute blood loss anemia, nerve or vessel injury, fracture, infection, DVT, dislocation, implant failure  and skin and soft tissue issues.  All these are heightened risk given her continued obesity.  She understands our goals are to decrease pain, improve mobility and overall improve quality of life.  DESCRIPTION OF PROCEDURE:  After informed consent was obtained, appropriate left hip was marked.  She was brought to  the operating room and sat up on a stretcher where spinal anesthesia was obtained.  She was laid in supine position on the stretcher.   Foley catheter was placed and then we placed traction boots on both her feet.  Next, she was placed supine on the Hana fracture table, the perineal post in place and both legs in line skeletal traction device and no traction applied.  Her left operative  hip was prepped and draped in DuraPrep and sterile drapes.  A timeout was called and she was identified correct patient, correct left hip.  I then made an incision just inferior and posterior to the anterior superior iliac spine and carried this  obliquely down the leg.  We dissected down tensor fascia lata muscle.  Tensor fascia was then divided longitudinally to proceed with direct anterior approach to the hip.  We identified and cauterized circumflex vessels and identified the hip capsule,  opened the hip capsule in L-type format finding a moderate joint effusion.  I placed Cobra retractors within the medial and lateral femoral neck within the capsule and made our femoral neck cut with an oscillating saw proximal to the lesser trochanter  and completed this on an osteotome.  We placed a corkscrew guide in the femoral head and removed the femoral head in its entirety and found a wide area devoid of cartilage.  I then placed a bent Hohmann over the medial acetabular rim and removed  periarticular osteophytes around the acetabulum as well as remnants of acetabular labrum and other debris.  I then began reaming under direct visualization from a size 43  reamer going all the way up to a size 51 with all reamers placed under direct  visualization.  Last reamer was placed under direct fluoroscopy, so we could obtain our depth of reaming, our inclination and anteversion.  I then placed a real DePuy Delta Gription acetabular component, size 52 with a 36+0 neutral polyethylene liner.   Attention was then turned to the femur with the  leg externally rotated to 120 degrees and extended and adducted, we were able to place a Mueller retractor medially and Hohman retractor behind the greater trochanter, we released lateral joint capsule and  used a box cutting osteotome to enter the femoral canal and a rongeur to lateralize, we then began broaching using the Corail broaching system from a size 8 going up to size 14, with a 14 in place, we trialed a standard offset femoral neck and a 32+15  hip ball, reduced this in the acetabulum.  It was stable on exam, but we felt like radiographically, we needed more offset.  We dislocated the hip, removed the trial components.  I then placed the real Corail femoral component, size 4 tibia with a high  offset stem and femoral neck.  We went with a real 36+1.5 metal hip ball and again reduced this in acetabulum.  We were pleased with stability.  We also assessed radiographically and mechanically.  We then irrigated the soft tissue with normal saline  solution using pulsatile lavage.  We dried the hip really well and were then closed remnants of the joint capsule with interrupted #1 Ethibond, #1 Vicryl was used to close the tensor fascia.  0 Vicryl was used to close the deep tissue and 2-0 Vicryl was  used to close subcutaneous tissue.  The skin was reapproximated with staples.  An Aquacel dressing was applied.  She was taken off the Hana table and taken to recovery in stable condition with all final counts being correct and no complications noted.   Of note, Benita Stabile, PA-C did assist during the entire case and assistance was crucial for facilitating all aspects of this case.   Elián.Darby D: 08/23/2020 8:37:09 am T: 08/23/2020 11:12:00 am  JOB: [id number]/ [id number]

## 2020-08-23 NOTE — Transfer of Care (Signed)
Immediate Anesthesia Transfer of Care Note  Patient: [name]  Procedure(s) Performed: Procedure(s): LEFT TOTAL HIP ARTHROPLASTY ANTERIOR APPROACH (Left)  Patient Location: PACU  Anesthesia Type:Spinal  Level of Consciousness:  sedated, patient cooperative and responds to stimulation  Airway & Oxygen Therapy:Patient Spontanous Breathing and Patient connected to face mask oxgen  Post-op Assessment:  Report given to PACU RN and Post -op Vital signs reviewed and stable  Post vital signs:  Reviewed and stable  Last Vitals:  Vitals:   08/23/20 0627  BP: (!) 148/82  Pulse: 81  Resp: 18  Temp: 37.1 C  SpO2: 53%    Complications: No apparent anesthesia complications

## 2020-08-23 NOTE — Discharge Instructions (Addendum)
Information on my medicine - ELIQUIS (apixaban)  This medication education was reviewed with me or my healthcare representative as part of my discharge preparation.  The pharmacist that spoke with me during my hospital stay was:    Why was Eliquis prescribed for you? Eliquis was prescribed for you to reduce the risk of blood clots forming after orthopedic surgery.    What do You need to know about Eliquis? Take your Eliquis TWICE DAILY - one tablet in the morning and one tablet in the evening with or without food.  It would be best to take the dose about the same time each day.  If you have difficulty swallowing the tablet whole please discuss with your pharmacist how to take the medication safely.  Take Eliquis exactly as prescribed by your doctor and DO NOT stop taking Eliquis without talking to the doctor who prescribed the medication.  Stopping without other medication to take the place of Eliquis may increase your risk of developing a clot.  After discharge, you should have regular check-up appointments with your healthcare provider that is prescribing your Eliquis.  What do you do if you miss a dose? If a dose of ELIQUIS is not taken at the scheduled time, take it as soon as possible on the same day and twice-daily administration should be resumed.  The dose should not be doubled to make up for a missed dose.  Do not take more than one tablet of ELIQUIS at the same time.  Important Safety Information A possible side effect of Eliquis is bleeding. You should call your healthcare provider right away if you experience any of the following: ? Bleeding from an injury or your nose that does not stop. ? Unusual colored urine (red or dark brown) or unusual colored stools (red or black). ? Unusual bruising for unknown reasons. ? A serious fall or if you hit your head (even if there is no bleeding).  Some medicines may interact with Eliquis and might increase your risk of bleeding  or clotting while on Eliquis. To help avoid this, consult your healthcare provider or pharmacist prior to using any new prescription or non-prescription medications, including herbals, vitamins, non-steroidal anti-inflammatory drugs (NSAIDs) and supplements.  This website has more information on Eliquis (apixaban): http://www.eliquis.com/eliquis/home  INSTRUCTIONS AFTER JOINT REPLACEMENT   o Remove items at home which could result in a fall. This includes throw rugs or furniture in walking pathways o ICE to the affected joint every three hours while awake for 30 minutes at a time, for at least the first 3-5 days, and then as needed for pain and swelling.  Continue to use ice for pain and swelling. You may notice swelling that will progress down to the foot and ankle.  This is normal after surgery.  Elevate your leg when you are not up walking on it.   o Continue to use the breathing machine you got in the hospital (incentive spirometer) which will help keep your temperature down.  It is common for your temperature to cycle up and down following surgery, especially at night when you are not up moving around and exerting yourself.  The breathing machine keeps your lungs expanded and your temperature down.   DIET:  As you were doing prior to hospitalization, we recommend a well-balanced diet.  DRESSING / WOUND CARE / SHOWERING  Keep the surgical dressing until follow up.  The dressing is water proof, so you can shower without any extra covering.  IF THE DRESSING  FALLS OFF or the wound gets wet inside, change the dressing with sterile gauze.  Please use good hand washing techniques before changing the dressing.  Do not use any lotions or creams on the incision until instructed by your surgeon.    ACTIVITY  o Increase activity slowly as tolerated, but follow the weight bearing instructions below.   o No driving for 6 weeks or until further direction given by your physician.  You cannot drive while  taking narcotics.  o No lifting or carrying greater than 10 lbs. until further directed by your surgeon. o Avoid periods of inactivity such as sitting longer than an hour when not asleep. This helps prevent blood clots.  o You may return to work once you are authorized by your doctor.     WEIGHT BEARING   Weight bearing as tolerated with assist device (walker, cane, etc) as directed, use it as long as suggested by your surgeon or therapist, typically at least 4-6 weeks.   EXERCISES  Results after joint replacement surgery are often greatly improved when you follow the exercise, range of motion and muscle strengthening exercises prescribed by your doctor. Safety measures are also important to protect the joint from further injury. Any time any of these exercises cause you to have increased pain or swelling, decrease what you are doing until you are comfortable again and then slowly increase them. If you have problems or questions, call your caregiver or physical therapist for advice.   Rehabilitation is important following a joint replacement. After just a few days of immobilization, the muscles of the leg can become weakened and shrink (atrophy).  These exercises are designed to build up the tone and strength of the thigh and leg muscles and to improve motion. Often times heat used for twenty to thirty minutes before working out will loosen up your tissues and help with improving the range of motion but do not use heat for the first two weeks following surgery (sometimes heat can increase post-operative swelling).   These exercises can be done on a training (exercise) mat, on the floor, on a table or on a bed. Use whatever works the best and is most comfortable for you.    Use music or television while you are exercising so that the exercises are a pleasant break in your day. This will make your life better with the exercises acting as a break in your routine that you can look forward to.   Perform  all exercises about fifteen times, three times per day or as directed.  You should exercise both the operative leg and the other leg as well.  Exercises include:   . Quad Sets - Tighten up the muscle on the front of the thigh (Quad) and hold for 5-10 seconds.   . Straight Leg Raises - With your knee straight (if you were given a brace, keep it on), lift the leg to 60 degrees, hold for 3 seconds, and slowly lower the leg.  Perform this exercise against resistance later as your leg gets stronger.  . Leg Slides: Lying on your back, slowly slide your foot toward your buttocks, bending your knee up off the floor (only go as far as is comfortable). Then slowly slide your foot back down until your leg is flat on the floor again.  Glenard Haring Wings: Lying on your back spread your legs to the side as far apart as you can without causing discomfort.  . Hamstring Strength:  Lying on your  back, push your heel against the floor with your leg straight by tightening up the muscles of your buttocks.  Repeat, but this time bend your knee to a comfortable angle, and push your heel against the floor.  You may put a pillow under the heel to make it more comfortable if necessary.   A rehabilitation program following joint replacement surgery can speed recovery and prevent re-injury in the future due to weakened muscles. Contact your doctor or a physical therapist for more information on knee rehabilitation.    CONSTIPATION  Constipation is defined medically as fewer than three stools per week and severe constipation as less than one stool per week.  Even if you have a regular bowel pattern at home, your normal regimen is likely to be disrupted due to multiple reasons following surgery.  Combination of anesthesia, postoperative narcotics, change in appetite and fluid intake all can affect your bowels.   YOU MUST use at least one of the following options; they are listed in order of increasing strength to get the job done.   They are all available over the counter, and you may need to use some, POSSIBLY even all of these options:    Drink plenty of fluids (prune juice may be helpful) and high fiber foods Colace 100 mg by mouth twice a day  Senokot for constipation as directed and as needed Dulcolax (bisacodyl), take with full glass of water  Miralax (polyethylene glycol) once or twice a day as needed.  If you have tried all these things and are unable to have a bowel movement in the first 3-4 days after surgery call either your surgeon or your primary doctor.    If you experience loose stools or diarrhea, hold the medications until you stool forms back up.  If your symptoms do not get better within 1 week or if they get worse, check with your doctor.  If you experience "the worst abdominal pain ever" or develop nausea or vomiting, please contact the office immediately for further recommendations for treatment.   ITCHING:  If you experience itching with your medications, try taking only a single pain pill, or even half a pain pill at a time.  You can also use Benadryl over the counter for itching or also to help with sleep.   TED HOSE STOCKINGS:  Use stockings on both legs until for at least 2 weeks or as directed by physician office. They may be removed at night for sleeping.  MEDICATIONS:  See your medication summary on the "After Visit Summary" that nursing will review with you.  You may have some home medications which will be placed on hold until you complete the course of blood thinner medication.  It is important for you to complete the blood thinner medication as prescribed.  PRECAUTIONS:  If you experience chest pain or shortness of breath - call 911 immediately for transfer to the hospital emergency department.   If you develop a fever greater that 101 F, purulent drainage from wound, increased redness or drainage from wound, foul odor from the wound/dressing, or calf pain - CONTACT YOUR SURGEON.                                                    FOLLOW-UP APPOINTMENTS:  If you do not already have a post-op appointment, please call  the office for an appointment to be seen by your surgeon.  Guidelines for how soon to be seen are listed in your "After Visit Summary", but are typically between 1-4 weeks after surgery.  OTHER INSTRUCTIONS:   Knee Replacement:  Do not place pillow under knee, focus on keeping the knee straight while resting. CPM instructions: 0-90 degrees, 2 hours in the morning, 2 hours in the afternoon, and 2 hours in the evening. Place foam block, curve side up under heel at all times except when in CPM or when walking.  DO NOT modify, tear, cut, or change the foam block in any way.  POST-OPERATIVE OPIOID TAPER INSTRUCTIONS: . It is important to wean off of your opioid medication as soon as possible. If you do not need pain medication after your surgery it is ok to stop day one. Marland Kitchen Opioids include: o Codeine, Hydrocodone(Norco, Vicodin), Oxycodone(Percocet, oxycontin) and hydromorphone amongst others.  . Long term and even short term use of opiods can cause: o Increased pain response o Dependence o Constipation o Depression o Respiratory depression o And more.  . Withdrawal symptoms can include o Flu like symptoms o Nausea, vomiting o And more . Techniques to manage these symptoms o Hydrate well o Eat regular healthy meals o Stay active o Use relaxation techniques(deep breathing, meditating, yoga) . Do Not substitute Alcohol to help with tapering . If you have been on opioids for less than two weeks and do not have pain than it is ok to stop all together.  . Plan to wean off of opioids o This plan should start within one week post op of your joint replacement. o Maintain the same interval or time between taking each dose and first decrease the dose.  o Cut the total daily intake of opioids by one tablet each day o Next start to increase the time between doses. o The last  dose that should be eliminated is the evening dose.     MAKE SURE YOU:  . Understand these instructions.  . Get help right away if you are not doing well or get worse.    Thank you for letting us be a part of your medical care team.  It is a privilege we respect greatly.  We hope these instructions will help you stay on track for a fast and full recovery!

## 2020-08-23 NOTE — Brief Op Note (Signed)
08/23/2020  8:38 AM  PATIENT:  [name]  70 y.o. female  PRE-OPERATIVE DIAGNOSIS:  osteoarthritis left hip  POST-OPERATIVE DIAGNOSIS:  osteoarthritis left hip  PROCEDURE:  Procedure(s): LEFT TOTAL HIP ARTHROPLASTY ANTERIOR APPROACH (Left)  SURGEON:  Surgeon(s) and Role:    Mcarthur Rossetti, MD - Primary  PHYSICIAN ASSISTANT:  Benita Stabile, PA-C  ANESTHESIA:   spinal  EBL:  250 mL   COUNTS:  YES  DICTATION: .Other Dictation: Dictation Number [id number]  PLAN OF CARE: Admit for overnight observation  PATIENT DISPOSITION:  PACU - hemodynamically stable.   Delay start of Pharmacological VTE agent (>24hrs) due to surgical blood loss or risk of bleeding: no

## 2020-08-23 NOTE — Anesthesia Preprocedure Evaluation (Signed)
Anesthesia Evaluation  Patient identified by MRN, date of birth, ID band Patient awake    Reviewed: Allergy & Precautions, NPO status , Patient's Chart, lab work & pertinent test results  Airway Mallampati: II  TM Distance: >3 FB Neck ROM: Full    Dental  (+) Teeth Intact   Pulmonary    breath sounds clear to auscultation       Cardiovascular  Rhythm:Regular Rate:Normal     Neuro/Psych    GI/Hepatic   Endo/Other    Renal/GU      Musculoskeletal   Abdominal (+) + obese,   Peds  Hematology   Anesthesia Other Findings   Reproductive/Obstetrics                             Anesthesia Physical Anesthesia Plan  ASA: III  Anesthesia Plan: MAC and Spinal   Post-op Pain Management:    Induction:   PONV Risk Score and Plan: Ondansetron and Dexamethasone  Airway Management Planned: Simple Face Mask and Natural Airway  Additional Equipment:   Intra-op Plan:   Post-operative Plan: Extubation in OR  Informed Consent: I have reviewed the patients History and Physical, chart, labs and discussed the procedure including the risks, benefits and alternatives for the proposed anesthesia with the patient or authorized representative who has indicated his/her understanding and acceptance.     Dental advisory given  Plan Discussed with: CRNA and Anesthesiologist  Anesthesia Plan Comments:         Anesthesia Quick Evaluation

## 2020-08-23 NOTE — Anesthesia Procedure Notes (Signed)
Spinal  Start time: 08/23/2020 7:25 AM End time: 08/23/2020 7:30 AM Staffing Performed: anesthesiologist  Anesthesiologist: Roberts Gaudy, MD Spinal Block Patient position: sitting Prep: ChloraPrep Patient monitoring: heart rate, cardiac monitor, continuous pulse ox and blood pressure Approach: midline Location: L3-4 Needle Needle type: Tuohy  Needle gauge: 22 G Assessment Sensory level: T6 Additional Notes 2 cc 0.75% Bupivacaine injected easily. Clear CSF

## 2020-08-24 LAB — BASIC METABOLIC PANEL
Anion gap: 9 (ref 5–15)
BUN: 9 mg/dL (ref 8–23)
CO2: 22 mmol/L (ref 22–32)
Calcium: 8.6 mg/dL — ABNORMAL LOW (ref 8.9–10.3)
Chloride: 105 mmol/L (ref 98–111)
Creatinine, Ser: 0.49 mg/dL (ref 0.44–1.00)
GFR, Estimated: >60 mL/min (ref >60)
Glucose, Bld: 121 mg/dL — ABNORMAL HIGH (ref 70–99)
Potassium: 4.4 mmol/L (ref 3.5–5.1)
Sodium: 136 mmol/L (ref 135–145)

## 2020-08-24 LAB — CBC
HCT: 39 % (ref 36.0–46.0)
Hemoglobin: 12.5 g/dL (ref 12.0–15.0)
MCH: 29.6 pg (ref 26.0–34.0)
MCHC: 32.1 g/dL (ref 30.0–36.0)
MCV: 92.2 fL (ref 80.0–100.0)
Platelets: 163 10*3/uL (ref 150–400)
RBC: 4.23 MIL/uL (ref 3.87–5.11)
RDW: 13.1 % (ref 11.5–15.5)
WBC: 9.2 10*3/uL (ref 4.0–10.5)
nRBC: 0 % (ref 0.0–0.2)

## 2020-08-24 MED ORDER — APIXABAN 2.5 MG PO TABS: 2.5000 mg | ORAL_TABLET | Freq: Two times a day (BID) | ORAL | 0 refills | Status: DC

## 2020-08-24 MED ORDER — HYDROCODONE-ACETAMINOPHEN 5-325 MG PO TABS: 1.0000 | ORAL_TABLET | ORAL | 0 refills | Status: DC | PRN

## 2020-08-24 MED ORDER — METHOCARBAMOL 500 MG PO TABS: 500.0000 mg | ORAL_TABLET | Freq: Four times a day (QID) | ORAL | 1 refills | Status: DC | PRN

## 2020-08-24 NOTE — Progress Notes (Signed)
Subjective: 1 Day Post-Op Procedure(s) (LRB): LEFT TOTAL HIP ARTHROPLASTY ANTERIOR APPROACH (Left) Patient reports pain as mild.  Complaining of IV in hand.   Objective: Vital signs in last 24 hours: Temp:  [97.5 F (36.4 C)-99 F (37.2 C)] 99 F (37.2 C) (04/09 0945) Pulse Rate:  [70-100] 71 (04/09 0945) Resp:  [16-19] 19 (04/09 0945) BP: (106-136)/(52-82) 106/61 (04/09 0945) SpO2:  [93 %-96 %] 94 % (04/09 0945)  Intake/Output from previous day: 04/08 0701 - 04/09 0700 In: 1533.7 [I.V.:1434.8; IV Piggyback:98.9] Out: 1040 [Urine:790; Blood:250] Intake/Output this shift: Total I/O In: 1979.3 [P.O.:720; I.V.:1259.3] Out: 850 [Urine:850]  Recent Labs    08/24/20 0247  HGB 12.5   Recent Labs    08/24/20 0247  WBC 9.2  RBC 4.23  HCT 39.0  PLT 163   Recent Labs    08/24/20 0247  NA 136  K 4.4  CL 105  CO2 22  BUN 9  CREATININE 0.49  GLUCOSE 121*  CALCIUM 8.6*   No results for input(s): LABPT, INR in the last 72 hours.  Left lower extremity: Incision: dressing C/D/I Compartment soft   Assessment/Plan: 1 Day Post-Op Procedure(s) (LRB): LEFT TOTAL HIP ARTHROPLASTY ANTERIOR APPROACH (Left) Up with therapy Will d/c IV left hand.  Will see how patient does with PT this afternoon. Most likely will need to stay till tomorrow but if does exceptionally well this afternoon could go home.      Sharline Lehane 08/24/2020, 1:13 PM

## 2020-08-24 NOTE — Plan of Care (Signed)
  Problem: Pain Management: Goal: Pain level will decrease with appropriate interventions Outcome: Progressing   

## 2020-08-24 NOTE — Plan of Care (Signed)
Plan of care reviewed and discussed with the patient. 

## 2020-08-24 NOTE — TOC Progression Note (Addendum)
Transition of Care [facility]) - Progression Note    Patient Details  Name: [name] MRN: [id number] Date of Birth: 03-Mar-1951  [facility] CM/SW Contact  Joaquin Courts, RN Phone Number: 08/24/2020, 10:10 AM  Clinical Narrative:    CM spoke with patient who reports she still needs rolling walker and 3in1. [employer] per [place] given referral and will deliver to bedside for home use, patient will need bariatric bedside commode.   Expected Discharge Plan: [place] Barriers to Discharge: No Barriers Identified  Expected Discharge Plan and Services Expected Discharge Plan: [place]   Discharge Planning Services: CM Consult Post Acute Care Choice: [place] arrangements for the past 2 months: Single Family Home                 DME Arranged: 3-N-1,Walker rolling DME Agency: Other - Comment [facility]) Date DME Agency Contacted: 08/24/20 Time DME Agency Contacted: [phone] Representative spoke with at DME Agency: Brenton Grills HH Arranged: PT [place]: [place] ([place]) (Referral and order given to Donella Stade with [facility] prior to surgery.)         Social Determinants of Health (SDOH) Interventions    Readmission Risk Interventions No flowsheet data found.

## 2020-08-24 NOTE — Progress Notes (Signed)
Physical Therapy Treatment Patient Details Name: [name] MRN: [id number] DOB: 1951-02-15 Today's Date: 08/24/2020    History of Present Illness Patient is 70 y.o. female s/p Lt THA anterior approach on 08/23/20 with PMH significant for OA, fibromyalgia, obesity, bil TKA.    PT Comments    Pt with high pain this session, motivated to progress. Min A to power up to stand with rocking momentum and BUE assisting. Pt limited in ambulation, tolerates 8' with RW, cues for upright posture, high pain limited L LE progression, able to slide it forward 50% of the time. Pt is limited by high pain with limited mobility. Plan to continue progressing gait, strengthening and mobility tomorrow with hopes for d/c home with family to assist pending progress.   Follow Up Recommendations  Follow surgeon's recommendation for DC plan and follow-up therapies;Home health PT     Equipment Recommendations  Rolling walker with 5" wheels;3in1 (PT)    Recommendations for Other Services       Precautions / Restrictions Precautions Precautions: Fall Restrictions Weight Bearing Restrictions: No    Mobility  Bed Mobility  General bed mobility comments: in chair    Transfers Overall transfer level: Needs assistance Equipment used: Rolling walker (2 wheeled) Transfers: Sit to/from Stand Sit to Stand: Min assist  General transfer comment: min A with BUE assisting to power up and rocking momentum, cues for glute/quad activation  Ambulation/Gait Ambulation/Gait assistance: Min assist Gait Distance (Feet): 8 Feet Assistive device: Rolling walker (2 wheeled) Gait Pattern/deviations: Decreased stride length;Step-to pattern;Decreased step length - left;Decreased weight shift to left;Shuffle;Trunk flexed Gait velocity: decreased   General Gait Details: slow steps, difficulty progressing L foot forward requiring 50% sliding foot progression, cues for upright posture, educated on RW height and sized personal  one appropriately, limited due to LLE pain   Stairs             Wheelchair Mobility    Modified Rankin (Stroke Patients Only)       Balance Overall balance assessment: Needs assistance Sitting-balance support: Feet supported;Bilateral upper extremity supported;Single extremity supported Sitting balance-Leahy Scale: Good Sitting balance - Comments: seated EOB   Standing balance support: During functional activity;Bilateral upper extremity supported Standing balance-Leahy Scale: Poor Standing balance comment: reliant on UE support     Cognition Arousal/Alertness: Awake/alert Behavior During Therapy: WFL for tasks assessed/performed Overall Cognitive Status: Within Functional Limits for tasks assessed       Exercises      General Comments        Pertinent Vitals/Pain Pain Assessment: 0-10 Pain Score: 10-Worst pain ever Pain Location: Lt hip Pain Descriptors / Indicators: Tightness;Cramping;Aching Pain Intervention(s): Limited activity within patient's tolerance;Monitored during session;Premedicated before session;Ice applied    Home Living                      Prior Function            PT Goals (current goals can now be found in the care plan section) Acute Rehab PT Goals Patient Stated Goal: get back home and start bulding up strength PT Goal Formulation: With patient Time For Goal Achievement: 08/30/20 Potential to Achieve Goals: Good Progress towards PT goals: Progressing toward goals    Frequency    7X/week      PT Plan Current plan remains appropriate    Co-evaluation              AM-PAC PT "6 Clicks" Mobility   Outcome Measure  Help needed turning from your back to your side while in a flat bed without using bedrails?: None Help needed moving from lying on your back to sitting on the side of a flat bed without using bedrails?: [name] needed moving to and from a bed to a chair (including a wheelchair)?: A  Little Help needed standing up from a chair using your arms (e.g., wheelchair or bedside chair)?: [name] needed to walk in hospital room?: [name] needed climbing 3-5 steps with a railing? : A Lot 6 Click Score: 18    End of Session Equipment Utilized During Treatment: Gait belt Activity Tolerance: Patient limited by pain Patient left: with call bell/phone within reach;in chair;with chair alarm set Nurse Communication: Mobility status PT Visit Diagnosis: Muscle weakness (generalized) (M62.81);Difficulty in walking, not elsewhere classified (R26.2)     Time: 8286-7519 PT Time Calculation (min) (ACUTE ONLY): 20 min  Charges:  $Therapeutic Activity: 8-22 mins                      Tori Majel Giel PT, DPT 08/24/20, 2:46 PM

## 2020-08-24 NOTE — Progress Notes (Addendum)
Physical Therapy Treatment Patient Details Name: [name] MRN: [id number] DOB: 12-Jun-1950 Today's Date: 08/24/2020    History of Present Illness Patient is 70 y.o. female s/p Lt THA anterior approach on 08/23/20 with PMH significant for OA, fibromyalgia, obesity, bil TKA.    PT Comments    Pt initially hesitant to mobilize, verbalizes pain and difficulty, but agreeable with encouragement. Pt cued on BLE strengthening exercise, heavy mind-body connection cues along with symmetrical movements allowing RLE to perform LLE to improve AROM with good carryover. Pt using personal belt to assist in LLE mobilizing over to EOB with significant increased time. Pt able to power up with rocking momentum and min A, c/o IV site discomfort in hand with pressure. Pt limited with ambulation, cued for upright posture, UE assisting on RW to minimize WB pain and LLE placement. Pt returns to sitting in chair, cued for LLE positioning to decrease pain with rising and lowering to seating. Nursing entered room to provide pain medication once pt in pain, all needs in reach. Pt slowly progressing with therapy, plan for afternoon session to improve tolerance to activity, strengthening and gait training.   Follow Up Recommendations  Follow surgeon's recommendation for DC plan and follow-up therapies;Home health PT     Equipment Recommendations  Rolling walker with 5" wheels;3in1 (PT)    Recommendations for Other Services       Precautions / Restrictions Precautions Precautions: Fall Restrictions Weight Bearing Restrictions: No    Mobility  Bed Mobility Overal bed mobility: Needs Assistance Bed Mobility: Supine to Sit  Supine to sit: Min guard;HOB elevated  General bed mobility comments: pt using personal belt around LLE to assist in sliding LLE over to EOB, cues for sequening and hand placement, min G and no physical assist    Transfers Overall transfer level: Needs assistance Equipment used: Rolling  walker (2 wheeled) Transfers: Sit to/from Stand Sit to Stand: Min assist;From elevated surface  General transfer comment: cues for hand placement, using rocking momentum to power up and cues for quad/glute activation, min A to power up to standing wih good steadiness upon rising  Ambulation/Gait Ambulation/Gait assistance: Min assist Gait Distance (Feet): 5 Feet Assistive device: Rolling walker (2 wheeled) Gait Pattern/deviations: Decreased stride length;Step-to pattern;Decreased step length - left;Decreased weight shift to left;Trunk flexed Gait velocity: decreased   General Gait Details: pt limited in ambulation this session, difficulty advancing LLE, cues for UE assist to decrease LLE pain, cues for upright posture   Stairs             Wheelchair Mobility    Modified Rankin (Stroke Patients Only)       Balance Overall balance assessment: Needs assistance Sitting-balance support: Feet supported;Bilateral upper extremity supported;Single extremity supported Sitting balance-Leahy Scale: Good Sitting balance - Comments: seated EOB   Standing balance support: During functional activity;Bilateral upper extremity supported Standing balance-Leahy Scale: Poor Standing balance comment: reliant on UE support     Cognition Arousal/Alertness: Awake/alert Behavior During Therapy: WFL for tasks assessed/performed Overall Cognitive Status: Within Functional Limits for tasks assessed     Exercises Total Joint Exercises Ankle Circles/Pumps: Supine;AROM;Both;20 reps Quad Sets: Supine;AROM;Strengthening;Left;10 reps Heel Slides: Supine;AAROM;Strengthening;Both;10 reps (with strap LLE) Hip ABduction/ADduction: Supine;AAROM;Strengthening;Both;10 reps (with strap LLE)    General Comments        Pertinent Vitals/Pain Pain Assessment: 0-10 Pain Score: 5  Pain Location: Lt hip Pain Descriptors / Indicators: Aching Pain Intervention(s): Limited activity within patient's  tolerance;Monitored during session;Repositioned;RN gave pain meds during session  Home Living                      Prior Function            PT Goals (current goals can now be found in the care plan section) Acute Rehab PT Goals Patient Stated Goal: get back home and start bulding up strength PT Goal Formulation: With patient Time For Goal Achievement: 08/30/20 Potential to Achieve Goals: Good Progress towards PT goals: Progressing toward goals    Frequency    7X/week      PT Plan Current plan remains appropriate    Co-evaluation              AM-PAC PT "6 Clicks" Mobility   Outcome Measure  Help needed turning from your back to your side while in a flat bed without using bedrails?: None Help needed moving from lying on your back to sitting on the side of a flat bed without using bedrails?: [name] needed moving to and from a bed to a chair (including a wheelchair)?: [name] needed standing up from a chair using your arms (e.g., wheelchair or bedside chair)?: [name] needed to walk in hospital room?: [name] needed climbing 3-5 steps with a railing? : A Lot 6 Click Score: 18    End of Session Equipment Utilized During Treatment: Gait belt Activity Tolerance: Patient tolerated treatment well Patient left: with call bell/phone within reach;in chair;with nursing/sitter in room Nurse Communication: Mobility status PT Visit Diagnosis: Muscle weakness (generalized) (M62.81);Difficulty in walking, not elsewhere classified (R26.2)     Time: 3254-9826 PT Time Calculation (min) (ACUTE ONLY): 38 min  Charges:  $Gait Training: 8-22 mins $Therapeutic Exercise: 8-22 mins $Therapeutic Activity: 8-22 mins                      Tori Zander Ingham PT, DPT 08/24/20, 10:48 AM

## 2020-08-24 NOTE — Discharge Summary (Signed)
Patient ID: [name] MRN: [id number] DOB/AGE: 02-06-1951 70 y.o.  Admit date: 08/23/2020 Discharge date: 08/24/2020  Admission Diagnoses:  Principal Problem:   Unilateral primary osteoarthritis, left hip Active Problems:   Status post left hip replacement   Discharge Diagnoses:  Same  Past Medical History:  Diagnosis Date  . Chronic knee pain   . Complication of anesthesia    unable to urinate after right rotator cuff   . Diverticulitis   . Fibromyalgia   . Heart murmur   . Obesity   . Osteoarthritis   . Postmenopausal   . Rosacea   . Spinal stenosis of lumbar region     Surgeries: Procedure(s): LEFT TOTAL HIP ARTHROPLASTY ANTERIOR APPROACH on 08/23/2020   Consultants:   Discharged Condition: Improved  Hospital Course: [name] is an 70 y.o. female who was admitted 08/23/2020 for operative treatment ofUnilateral primary osteoarthritis, left hip. Patient has severe unremitting pain that affects sleep, daily activities, and work/hobbies. After pre-op clearance the patient was taken to the operating room on 08/23/2020 and underwent  Procedure(s): LEFT TOTAL HIP ARTHROPLASTY ANTERIOR APPROACH.    Patient was given perioperative antibiotics:  Anti-infectives (From admission, onward)   Start     Dose/Rate Route Frequency Ordered Stop   08/23/20 1300  ceFAZolin (ANCEF) IVPB 1 g/50 mL premix        1 g 100 mL/hr over 30 Minutes Intravenous Every 6 hours 08/23/20 1010 08/23/20 2030   08/23/20 0600  ceFAZolin (ANCEF) IVPB 2g/100 mL premix        2 g 200 mL/hr over 30 Minutes Intravenous On call to O.R. 08/23/20 0544 08/23/20 0732       Patient was given sequential compression devices, early ambulation, and chemoprophylaxis to prevent DVT.  Patient benefited maximally from hospital stay and there were no complications.    Recent vital signs:  Patient Vitals for the past 24 hrs:  BP Temp Temp src Pulse Resp SpO2  08/24/20 0945 106/61 99 F (37.2 C) Oral 71 19 94 %   08/24/20 0505 120/62 98 F (36.7 C) -- 70 16 93 %  08/24/20 0046 111/67 98 F (36.7 C) -- 84 16 94 %  08/23/20 2040 (!) 106/52 (!) 97.5 F (36.4 C) Oral 88 16 93 %  08/23/20 1738 136/82 98.5 F (36.9 C) Oral 100 -- 96 %  08/23/20 1624 122/79 98 F (36.7 C) Oral 93 17 93 %     Recent laboratory studies:  Recent Labs    08/24/20 0247  WBC 9.2  HGB 12.5  HCT 39.0  PLT 163  NA 136  K 4.4  CL 105  CO2 22  BUN 9  CREATININE 0.49  GLUCOSE 121*  CALCIUM 8.6*     Discharge Medications:     Diagnostic Studies: DG Pelvis Portable  Result Date: 08/23/2020 CLINICAL DATA:  Status post left total hip replacement. Anterior broach. EXAM: PORTABLE PELVIS 1-2 VIEWS COMPARISON:  Intraoperative radiographs 08/23/2020. FINDINGS: Single AP view demonstrates left total hip arthroplasty. Components are stable in position. No acute fracture is present. Foley catheter is in place. IMPRESSION: Left total hip arthroplasty without radiographic evidence for complication. Electronically Signed   By: San Morelle M.D.   On: 08/23/2020 11:11   DG C-Arm 1-60 Min-No Report  Result Date: 08/23/2020 Fluoroscopy was utilized by the requesting physician.  No radiographic interpretation.   DG HIP OPERATIVE UNILAT W OR W/O PELVIS LEFT  Result Date: 08/23/2020 CLINICAL DATA:  Left hip replacement.  EXAM: OPERATIVE left HIP (WITH PELVIS IF PERFORMED) 3 VIEWS TECHNIQUE: Fluoroscopic spot image(s) were submitted for interpretation post-operatively. Radiation exposure index: 3.2381 mGy. COMPARISON:  April 04, 2020. FINDINGS: Three intraoperative fluoroscopic images were obtained of the left hip. The left acetabular and femoral components are well situated. IMPRESSION: Fluoroscopic guidance provided during left total hip arthroplasty. Electronically Signed   By: Marijo Conception M.D.   On: 08/23/2020 08:58    Disposition:      Follow-up Information    Mcarthur Rossetti, MD. Go on 09/05/2020.    Specialty: Orthopedic Surgery Why: at 1:00 pm for your 2 week in office post-op appointment Contact information: [place] [address] [phone]                Signed: Erskine Emery 08/24/2020, 1:30 PM

## 2020-08-24 NOTE — Plan of Care (Signed)
  Problem: Clinical Measurements: Goal: Respiratory complications will improve Outcome: Progressing   Problem: Clinical Measurements: Goal: Cardiovascular complication will be avoided Outcome: Progressing   Problem: Activity: Goal: Risk for activity intolerance will decrease Outcome: Progressing   Problem: Nutrition: Goal: Adequate nutrition will be maintained Outcome: Progressing   Problem: Safety: Goal: Ability to remain free from injury will improve Outcome: Progressing   Problem: Activity: Goal: Ability to avoid complications of mobility impairment will improve Outcome: Progressing   Problem: Skin Integrity: Goal: Will show signs of wound healing Outcome: Progressing

## 2020-08-25 DIAGNOSIS — E669 Obesity, unspecified: Secondary | ICD-10-CM | POA: Diagnosis present

## 2020-08-25 DIAGNOSIS — Z79899 Other long term (current) drug therapy: Secondary | ICD-10-CM | POA: Diagnosis not present

## 2020-08-25 DIAGNOSIS — Z96642 Presence of left artificial hip joint: Secondary | ICD-10-CM

## 2020-08-25 DIAGNOSIS — Z6838 Body mass index (BMI) 38.0-38.9, adult: Secondary | ICD-10-CM | POA: Diagnosis not present

## 2020-08-25 DIAGNOSIS — M1612 Unilateral primary osteoarthritis, left hip: Secondary | ICD-10-CM | POA: Diagnosis present

## 2020-08-25 DIAGNOSIS — Z981 Arthrodesis status: Secondary | ICD-10-CM | POA: Diagnosis not present

## 2020-08-25 DIAGNOSIS — M797 Fibromyalgia: Secondary | ICD-10-CM | POA: Diagnosis present

## 2020-08-25 DIAGNOSIS — Z886 Allergy status to analgesic agent status: Secondary | ICD-10-CM | POA: Diagnosis not present

## 2020-08-25 DIAGNOSIS — E559 Vitamin D deficiency, unspecified: Secondary | ICD-10-CM | POA: Diagnosis present

## 2020-08-25 DIAGNOSIS — Z96653 Presence of artificial knee joint, bilateral: Secondary | ICD-10-CM | POA: Diagnosis present

## 2020-08-25 DIAGNOSIS — M8580 Other specified disorders of bone density and structure, unspecified site: Secondary | ICD-10-CM | POA: Diagnosis present

## 2020-08-25 DIAGNOSIS — Z8249 Family history of ischemic heart disease and other diseases of the circulatory system: Secondary | ICD-10-CM | POA: Diagnosis not present

## 2020-08-25 NOTE — Progress Notes (Signed)
Physical Therapy Treatment Patient Details Name: [name] MRN: [id number] DOB: [birth year] Today's Date: 08/25/2020    History of Present Illness Patient is 70 y.o. female s/p Lt THA anterior approach on 08/23/20 with PMH significant for OA, fibromyalgia, obesity, bil TKA.    PT Comments    Pt very cooperative but anxious regarding lack of progress prior to today.  Pt expressing desire to avoid SNF and return home if at all possible.  Pt with marked improvement in activity tolerance this date and up to ambulate 3x distance from yesterday.   Follow Up Recommendations  Follow surgeon's recommendation for DC plan and follow-up therapies;Home health PT     Equipment Recommendations  Rolling walker with 5" wheels;3in1 (PT)    Recommendations for Other Services       Precautions / Restrictions Precautions Precautions: Fall Restrictions Weight Bearing Restrictions: No LLE Weight Bearing: Weight bearing as tolerated    Mobility  Bed Mobility               General bed mobility comments: in chair    Transfers Overall transfer level: Needs assistance Equipment used: Rolling walker (2 wheeled) Transfers: Sit to/from Stand Sit to Stand: Min assist         General transfer comment: min A with BUE assisting to power up and rocking momentum, cues for glute/quad activation  Ambulation/Gait Ambulation/Gait assistance: Min assist Gait Distance (Feet): 25 Feet Assistive device: Rolling walker (2 wheeled) Gait Pattern/deviations: Decreased stride length;Step-to pattern;Decreased step length - left;Decreased weight shift to left;Shuffle;Trunk flexed Gait velocity: decreased   General Gait Details: min cues for sequence, posture and position from RW.  Socks sans grips on L to allow pt to initially slide foot on floor (she has been doing this method for over a year) but pt noted to clear foot on several steps   Stairs             Wheelchair Mobility    Modified  Rankin (Stroke Patients Only)       Balance Overall balance assessment: Needs assistance Sitting-balance support: Feet supported;Bilateral upper extremity supported;Single extremity supported Sitting balance-Leahy Scale: Good     Standing balance support: No upper extremity supported Standing balance-Leahy Scale: Fair Standing balance comment: Pt able to don mask with both hands in standing                            Cognition Arousal/Alertness: Awake/alert Behavior During Therapy: WFL for tasks assessed/performed Overall Cognitive Status: Within Functional Limits for tasks assessed                                        Exercises Total Joint Exercises Ankle Circles/Pumps: Supine;AROM;Both;20 reps Quad Sets: Supine;AROM;Strengthening;Left;10 reps Heel Slides: Supine;AAROM;Strengthening;Both;20 reps Hip ABduction/ADduction: Supine;AAROM;Strengthening;Both;15 reps    General Comments General comments (skin integrity, edema, etc.): Pt with fused L ankle      Pertinent Vitals/Pain Pain Assessment: 0-10 Pain Score: 8  Pain Location: Lt hip with activity but min pain at rest Pain Descriptors / Indicators: Tightness;Cramping;Aching;Burning Pain Intervention(s): Limited activity within patient's tolerance;Monitored during session;Premedicated before session;Ice applied    Home Living                      Prior Function            PT Goals (  current goals can now be found in the care plan section) Acute Rehab PT Goals Patient Stated Goal: get back home and start bulding up strength PT Goal Formulation: With patient Time For Goal Achievement: 08/30/20 Potential to Achieve Goals: Good Progress towards PT goals: Progressing toward goals    Frequency    7X/week      PT Plan Current plan remains appropriate    Co-evaluation              AM-PAC PT "6 Clicks" Mobility   Outcome Measure  Help needed turning from your back to  your side while in a flat bed without using bedrails?: None Help needed moving from lying on your back to sitting on the side of a flat bed without using bedrails?: [name] needed moving to and from a bed to a chair (including a wheelchair)?: [name] needed standing up from a chair using your arms (e.g., wheelchair or bedside chair)?: [name] needed to walk in hospital room?: [name] needed climbing 3-5 steps with a railing? : A Lot 6 Click Score: 18    End of Session Equipment Utilized During Treatment: Gait belt Activity Tolerance: Patient limited by pain;Patient tolerated treatment well;Patient limited by fatigue Patient left: with call bell/phone within reach;in chair;with chair alarm set Nurse Communication: Mobility status PT Visit Diagnosis: Muscle weakness (generalized) (M62.81);Difficulty in walking, not elsewhere classified (R26.2)     Time: 3536-1443 PT Time Calculation (min) (ACUTE ONLY): 42 min  Charges:  $Gait Training: 8-22 mins $Therapeutic Exercise: 8-22 mins $Therapeutic Activity: 8-22 mins                     Debe Coder PT Acute Rehabilitation Services Pager [phone] Office [phone]    [name] 08/25/2020, 11:03 AM

## 2020-08-25 NOTE — Progress Notes (Signed)
Physical Therapy Treatment Patient Details Name: [name] MRN: [id number] DOB: 1950-12-14 Today's Date: 08/25/2020    History of Present Illness Patient is 70 y.o. female s/p Lt THA anterior approach on 08/23/20 with PMH significant for OA, fibromyalgia, obesity, bil TKA.    PT Comments    Marked improvement in activity tolerance noted with pt ambulating ~60' in hall before requiring seated break.   Follow Up Recommendations  Follow surgeon's recommendation for DC plan and follow-up therapies;Home health PT     Equipment Recommendations  Rolling walker with 5" wheels;3in1 (PT)    Recommendations for Other Services       Precautions / Restrictions Precautions Precautions: Fall Restrictions Weight Bearing Restrictions: No LLE Weight Bearing: Weight bearing as tolerated    Mobility  Bed Mobility Overal bed mobility: Needs Assistance Bed Mobility: Supine to Sit     Supine to sit: Min guard;HOB elevated     General bed mobility comments: Increased time with cues for sequence and use of R LE to self assist.  Pt exiting bed toward weaker side and utilizing belt to assist L LE as well as second belt on footboard to assist in bring trunk upright.    Transfers Overall transfer level: Needs assistance Equipment used: Rolling walker (2 wheeled) Transfers: Sit to/from Stand Sit to Stand: Min assist         General transfer comment: min A with BUE assisting to power up and rocking momentum, cues for glute/quad activation  Ambulation/Gait Ambulation/Gait assistance: Min guard Gait Distance (Feet): 60 Feet Assistive device: Rolling walker (2 wheeled) Gait Pattern/deviations: Decreased stride length;Step-to pattern;Decreased step length - left;Decreased weight shift to left;Shuffle;Trunk flexed Gait velocity: decreased   General Gait Details: min cues for sequence, posture and position from RW.  Socks sans grips on L to allow pt to initially slide foot on floor (she has  been doing this method for over a year) but pt noted to clear foot intermittently   Stairs             Wheelchair Mobility    Modified Rankin (Stroke Patients Only)       Balance Overall balance assessment: Needs assistance Sitting-balance support: Feet supported;Bilateral upper extremity supported;Single extremity supported Sitting balance-Leahy Scale: Good Sitting balance - Comments: seated EOB   Standing balance support: No upper extremity supported Standing balance-Leahy Scale: Fair Standing balance comment: Pt able to don mask with both hands in standing                            Cognition Arousal/Alertness: Awake/alert Behavior During Therapy: WFL for tasks assessed/performed Overall Cognitive Status: Within Functional Limits for tasks assessed                                        Exercises      General Comments General comments (skin integrity, edema, etc.): Pt with fused L ankle      Pertinent Vitals/Pain Pain Assessment: 0-10 Pain Score: 7  Pain Location: Lt hip with activity but min pain at rest Pain Descriptors / Indicators: Tightness;Cramping;Aching;Burning Pain Intervention(s): Limited activity within patient's tolerance;Monitored during session;Premedicated before session    Home Living                      Prior Function  PT Goals (current goals can now be found in the care plan section) Acute Rehab PT Goals Patient Stated Goal: get back home and start bulding up strength PT Goal Formulation: With patient Time For Goal Achievement: 08/30/20 Potential to Achieve Goals: Good Progress towards PT goals: Progressing toward goals    Frequency    7X/week      PT Plan Current plan remains appropriate    Co-evaluation              AM-PAC PT "6 Clicks" Mobility   Outcome Measure  Help needed turning from your back to your side while in a flat bed without using bedrails?:  None Help needed moving from lying on your back to sitting on the side of a flat bed without using bedrails?: [name] needed moving to and from a bed to a chair (including a wheelchair)?: [name] needed standing up from a chair using your arms (e.g., wheelchair or bedside chair)?: [name] needed to walk in hospital room?: [name] needed climbing 3-5 steps with a railing? : A Lot 6 Click Score: 18    End of Session Equipment Utilized During Treatment: Gait belt Activity Tolerance: Patient tolerated treatment well Patient left: in chair;with call bell/phone within reach Nurse Communication: Mobility status PT Visit Diagnosis: Muscle weakness (generalized) (M62.81);Difficulty in walking, not elsewhere classified (R26.2)     Time: 3710-6269 PT Time Calculation (min) (ACUTE ONLY): 29 min  Charges:  $Gait Training: 8-22 mins $Therapeutic Activity: 8-22 mins                     [facility] Pager [phone] Office [phone]    Anson Peddie 08/25/2020, 4:09 PM

## 2020-08-25 NOTE — Plan of Care (Signed)
  Problem: Clinical Measurements: Goal: Diagnostic test results will improve Outcome: Progressing   Problem: Clinical Measurements: Goal: Respiratory complications will improve Outcome: Progressing   Problem: Clinical Measurements: Goal: Cardiovascular complication will be avoided Outcome: Progressing   Problem: Activity: Goal: Risk for activity intolerance will decrease Outcome: Progressing   Problem: Nutrition: Goal: Adequate nutrition will be maintained Outcome: Progressing

## 2020-08-25 NOTE — Plan of Care (Signed)
  Problem: Pain Managment: Goal: General experience of comfort will improve Outcome: Progressing   Problem: Safety: Goal: Ability to remain free from injury will improve Outcome: Progressing   

## 2020-08-25 NOTE — Progress Notes (Signed)
Physical Therapy Treatment Patient Details Name: [name] MRN: [id number] DOB: Jul 09, 1950 Today's Date: 08/25/2020    History of Present Illness Patient is 70 y.o. female s/p Lt THA anterior approach on 08/23/20 with PMH significant for OA, fibromyalgia, obesity, bil TKA.    PT Comments    Pt continues to make significant progress with each session this date.  Pt demonstrating marked improvement in activity tolerance, noted increased speed and confidence of movement and requiring decreased assist for all tasks.  Pt hopeful for dc home with family tomorrow.  Follow Up Recommendations  Follow surgeon's recommendation for DC plan and follow-up therapies;Home health PT     Equipment Recommendations  Rolling walker with 5" wheels;3in1 (PT)    Recommendations for Other Services       Precautions / Restrictions Precautions Precautions: Fall Restrictions Weight Bearing Restrictions: No LLE Weight Bearing: Weight bearing as tolerated    Mobility  Bed Mobility Overal bed mobility: Needs Assistance Bed Mobility: Sit to Supine     Supine to sit: Min guard;HOB elevated Sit to supine: Min guard   General bed mobility comments: Increased time with min cues for sequence and use of R LE to self assist.Pt entering bed with R LE first and utilizing belt to assist L LE    Transfers Overall transfer level: Needs assistance Equipment used: Rolling walker (2 wheeled) Transfers: Sit to/from Stand Sit to Stand: Min assist         General transfer comment: min A with BUE assisting to power up and rocking momentum, cues for glute/quad activation  Ambulation/Gait Ambulation/Gait assistance: Min guard Gait Distance (Feet): 60 Feet Assistive device: Rolling walker (2 wheeled) Gait Pattern/deviations: Decreased stride length;Step-to pattern;Decreased step length - left;Decreased weight shift to left;Shuffle;Trunk flexed Gait velocity: decreased   General Gait Details: min cues for  sequence, posture and position from RW.  Socks sans grips on L to allow pt to initially slide foot on floor (she has been doing this method for over a year) but pt noted to clear foot intermittently   Stairs             Wheelchair Mobility    Modified Rankin (Stroke Patients Only)       Balance Overall balance assessment: Needs assistance Sitting-balance support: Feet supported;Bilateral upper extremity supported;Single extremity supported Sitting balance-Leahy Scale: Good Sitting balance - Comments: seated EOB   Standing balance support: No upper extremity supported Standing balance-Leahy Scale: Fair Standing balance comment: Pt able to don mask with both hands in standing                            Cognition Arousal/Alertness: Awake/alert Behavior During Therapy: WFL for tasks assessed/performed Overall Cognitive Status: Within Functional Limits for tasks assessed                                        Exercises      General Comments General comments (skin integrity, edema, etc.): Pt with fused L ankle      Pertinent Vitals/Pain Pain Assessment: 0-10 Pain Score: 7  Pain Location: Lt hip with activity but min pain at rest Pain Descriptors / Indicators: Tightness;Cramping;Aching;Burning Pain Intervention(s): Limited activity within patient's tolerance;Monitored during session;Premedicated before session    Home Living  Prior Function            PT Goals (current goals can now be found in the care plan section) Acute Rehab PT Goals Patient Stated Goal: get back home and start bulding up strength PT Goal Formulation: With patient Time For Goal Achievement: 08/30/20 Potential to Achieve Goals: Good Progress towards PT goals: Progressing toward goals    Frequency    7X/week      PT Plan Current plan remains appropriate    Co-evaluation              AM-PAC PT "6 Clicks" Mobility    Outcome Measure  Help needed turning from your back to your side while in a flat bed without using bedrails?: None Help needed moving from lying on your back to sitting on the side of a flat bed without using bedrails?: [name] needed moving to and from a bed to a chair (including a wheelchair)?: [name] needed standing up from a chair using your arms (e.g., wheelchair or bedside chair)?: [name] needed to walk in hospital room?: [name] needed climbing 3-5 steps with a railing? : A Lot 6 Click Score: 18    End of Session Equipment Utilized During Treatment: Gait belt Activity Tolerance: Patient tolerated treatment well Patient left: in bed;with call bell/phone within reach;with bed alarm set Nurse Communication: Mobility status PT Visit Diagnosis: Muscle weakness (generalized) (M62.81);Difficulty in walking, not elsewhere classified (R26.2)     Time: 4081-4481 PT Time Calculation (min) (ACUTE ONLY): 22 min  Charges:  $Gait Training: 8-22 mins $Therapeutic Activity: 8-22 mins                     [place] Pager [phone] Office [phone]    Jamesetta Greenhalgh 08/25/2020, 4:14 PM

## 2020-08-25 NOTE — Progress Notes (Signed)
Subjective: 2 Days Post-Op Procedure(s) (LRB): LEFT TOTAL HIP ARTHROPLASTY ANTERIOR APPROACH (Left) Patient reports pain as moderate.  Slow progress with PT.   Objective: Vital signs in last 24 hours: Temp:  [98.2 F (36.8 C)-100.9 F (38.3 C)] 98.8 F (37.1 C) (04/10 0652) Pulse Rate:  [71-77] 77 (04/10 0652) Resp:  [16-20] 20 (04/10 0652) BP: (106-123)/(53-64) 116/55 (04/10 0652) SpO2:  [92 %-96 %] 92 % (04/10 0652)  Intake/Output from previous day: 04/09 0701 - 04/10 0700 In: 3659.3 [P.O.:2400; I.V.:1259.3] Out: 3250 [Urine:3250] Intake/Output this shift: No intake/output data recorded.  Recent Labs    08/24/20 0247  HGB 12.5   Recent Labs    08/24/20 0247  WBC 9.2  RBC 4.23  HCT 39.0  PLT 163   Recent Labs    08/24/20 0247  NA 136  K 4.4  CL 105  CO2 22  BUN 9  CREATININE 0.49  GLUCOSE 121*  CALCIUM 8.6*   No results for input(s): LABPT, INR in the last 72 hours.  Incision: scant drainage Compartment soft   Assessment/Plan: 2 Days Post-Op Procedure(s) (LRB): LEFT TOTAL HIP ARTHROPLASTY ANTERIOR APPROACH (Left) Up with therapy  Possible discharge home tomorrow .      Loyal Holzheimer 08/25/2020, 9:25 AM

## 2020-08-26 ENCOUNTER — Encounter (HOSPITAL_COMMUNITY): Payer: Self-pay | Admitting: Orthopaedic Surgery

## 2020-08-26 ENCOUNTER — Telehealth: Payer: Self-pay

## 2020-08-26 MED ORDER — HYDROCODONE-ACETAMINOPHEN 5-325 MG PO TABS: 1.0000 | ORAL_TABLET | Freq: Four times a day (QID) | ORAL | 0 refills | Status: DC | PRN

## 2020-08-26 NOTE — Progress Notes (Signed)
Physical Therapy Treatment Patient Details Name: [name] MRN: [id number] DOB: 1951-04-04 Today's Date: 08/26/2020    History of Present Illness Patient is 70 y.o. female s/p Lt THA anterior approach on 08/23/20 with PMH significant for OA, fibromyalgia, obesity, bil TKA.    PT Comments    POD # 3 am session Pt OOB in recliner.  Assisted with amb a functional distance Then returned to room to perform some TE's following HEP handout.  Instructed on proper tech, freq as well as use of ICE.   Addressed all mobility questions, discussed appropriate activity, educated on use of ICE.  Pt ready for D/C to home.   Follow Up Recommendations  Follow surgeon's recommendation for DC plan and follow-up therapies;Home health PT     Equipment Recommendations  Rolling walker with 5" wheels;3in1 (PT)    Recommendations for Other Services       Precautions / Restrictions Precautions Precautions: Fall Restrictions Weight Bearing Restrictions: No LLE Weight Bearing: Weight bearing as tolerated    Mobility  Bed Mobility               General bed mobility comments: OOB in recliner    Transfers Overall transfer level: Needs assistance Equipment used: Rolling walker (2 wheeled) Transfers: Sit to/from [employer] Transfers Sit to Stand: Supervision Stand pivot transfers: Supervision       General transfer comment: increased time and effort due to pain but self able  Ambulation/Gait Ambulation/Gait assistance: Supervision Gait Distance (Feet): 55 Feet Assistive device: Rolling walker (2 wheeled) Gait Pattern/deviations: Decreased stride length;Step-to pattern;Decreased step length - left;Decreased weight shift to left;Shuffle;Trunk flexed Gait velocity: decreased   General Gait Details: increased time and effort with some difficulty advancing L LE due to pain leve 8/10 with step to gait.  Tolerated a functional distance.   Stairs             Wheelchair  Mobility    Modified Rankin (Stroke Patients Only)       Balance                                            Cognition Arousal/Alertness: Awake/alert Behavior During Therapy: WFL for tasks assessed/performed Overall Cognitive Status: Within Functional Limits for tasks assessed                                 General Comments: AxO x 3 very motivated      Exercises   Total Hip Replacement TE's following HEP Handout 10 reps ankle pumps 05 reps knee presses 05 reps heel slides 05 reps SAQ's 05 reps ABD Instructed how to use a belt loop to assist  Followed by ICE     General Comments        Pertinent Vitals/Pain Pain Assessment: 0-10 Pain Score: 8  Pain Location: Lt hip with activity but min pain at rest Pain Descriptors / Indicators: Tightness;Cramping;Aching;Burning Pain Intervention(s): Monitored during session;Repositioned;Premedicated before session;Ice applied    Home Living                      Prior Function            PT Goals (current goals can now be found in the care plan section) Progress towards PT goals: Progressing toward goals  Frequency    7X/week      PT Plan Current plan remains appropriate    Co-evaluation              AM-PAC PT "6 Clicks" Mobility   Outcome Measure  Help needed turning from your back to your side while in a flat bed without using bedrails?: [name] needed moving from lying on your back to sitting on the side of a flat bed without using bedrails?: [name] needed moving to and from a bed to a chair (including a wheelchair)?: [name] needed standing up from a chair using your arms (e.g., wheelchair or bedside chair)?: [name] needed to walk in hospital room?: [name] needed climbing 3-5 steps with a railing? : A Little 6 Click Score: 18    End of Session Equipment Utilized During Treatment: Gait belt Activity Tolerance: Patient  tolerated treatment well Patient left: in chair;with call bell/phone within reach Nurse Communication: Mobility status (pt ready for D/C to home) PT Visit Diagnosis: Muscle weakness (generalized) (M62.81);Difficulty in walking, not elsewhere classified (R26.2)     Time: 5631-4970 PT Time Calculation (min) (ACUTE ONLY): 48 min  Charges:  $Gait Training: 8-22 mins $Therapeutic Exercise: 8-22 mins $Therapeutic Activity: 8-22 mins                     Rica Koyanagi  PTA Acute  Rehabilitation Services Pager      [phone] Office      [phone]

## 2020-08-26 NOTE — Telephone Encounter (Signed)
Dx given

## 2020-08-26 NOTE — Progress Notes (Signed)
Patient was given discharge instructions, and all questions were answered.  Patient was stable for discharge and was taken to the main exit by wheelchair. 

## 2020-08-26 NOTE — Telephone Encounter (Signed)
cierra from [facility] called she is requesting the diagnosis code for rx she received for hydrocodone call back: [phone]

## 2020-08-26 NOTE — Progress Notes (Signed)
Patient ID: [name], female   DOB: 1950-10-25, 70 y.o.   MRN: [id number] No acute changes.  Doing well overall.  Her left hip is stable.  She can be discharged home today.

## 2020-08-27 ENCOUNTER — Telehealth: Payer: Self-pay | Admitting: *Deleted

## 2020-08-27 NOTE — Telephone Encounter (Signed)
Ortho bundle D/C call completed. 

## 2020-08-27 NOTE — Care Plan (Signed)
Call from patient later in the day stating she is having lots of swelling in outer hip and is concerned regarding redness and warmth at outer hip. Picture taken via cell phone and sent to CM via email. This was forwarded to Dr. Ninfa Linden to visualize. Per Dr. Ninfa Linden- this is normal given her thigh size/obesity combined with being on Eliquis. Ice for surer and stop Eliquis. Go to baby aspirin twice daily.Call to patient and updated on new orders for medication change. Verbal orders also given to HHPT that was in home during phone call. Will continue to check status.

## 2020-08-27 NOTE — Telephone Encounter (Signed)
Ortho bundle call . 

## 2020-08-29 ENCOUNTER — Telehealth: Payer: Self-pay | Admitting: *Deleted

## 2020-08-29 NOTE — Telephone Encounter (Signed)
Ortho bundle call from patient. States redness in hip has definitely changed color to more bruising. She stopped Eliquis and started baby aspirin. Wants to know if she could take Ibuprofen in between or in place of pain medication dose as it's making her constipated. I told her I'd ask since she is already so bruised around the hip due to surgery and Eliquis in beginning. Ibuprofen? Or No? Thanks.

## 2020-08-29 NOTE — Telephone Encounter (Signed)
Call to patient and updated ok to take Ibuprofen if she wishes per Dr. Ninfa Linden.

## 2020-08-29 NOTE — Telephone Encounter (Signed)
I am fine with ibuprofen. Not as much of a bleeding issue with that.

## 2020-09-05 ENCOUNTER — Encounter: Payer: Self-pay | Admitting: Orthopaedic Surgery

## 2020-09-05 ENCOUNTER — Ambulatory Visit (INDEPENDENT_AMBULATORY_CARE_PROVIDER_SITE_OTHER): Payer: Medicare Other | Admitting: Orthopaedic Surgery

## 2020-09-05 ENCOUNTER — Other Ambulatory Visit: Payer: Self-pay | Admitting: Orthopaedic Surgery

## 2020-09-05 DIAGNOSIS — Z96642 Presence of left artificial hip joint: Secondary | ICD-10-CM

## 2020-09-05 MED ORDER — HYDROCODONE-ACETAMINOPHEN 5-325 MG PO TABS: 1.0000 | ORAL_TABLET | Freq: Four times a day (QID) | ORAL | 0 refills | Status: DC | PRN

## 2020-09-05 NOTE — Progress Notes (Signed)
The patient is a 70 year old female who is 2 weeks tomorrow status post a left total hip arthroplasty.  She is at a very slow gait with therapy given the fact that she has had knee surgery in the past as well as a left ankle fusion.  They are recommending 2 more weeks of home therapy.  I think this is reasonable considering she is a fall risk.  Her left hip incision looks good.  I remove the staples in place Steri-Strips.  There is a moderate hematoma but no significant seroma.  I told her I will have her go down to 1 baby aspirin a day for a week and then can stop baby aspirin.  I will send in some more hydrocodone for pain and filled out her home health information for her.  I would like to see her back in 4 weeks to see how she is doing overall but no x-rays are needed.  All questions and concerns were answered and addressed.

## 2020-09-09 ENCOUNTER — Telehealth: Payer: Self-pay | Admitting: *Deleted

## 2020-09-09 NOTE — Telephone Encounter (Signed)
Ortho bundle 14 day call completed. 

## 2020-09-19 ENCOUNTER — Telehealth: Payer: Self-pay | Admitting: Orthopaedic Surgery

## 2020-09-19 NOTE — Telephone Encounter (Signed)
Received call from [facility] with [facility] needing verbal order for HHPT 2 wk 2 for hip weakness due to having a bad gait pattern. The number to contact Cecilie Lowers is [phone]

## 2020-09-19 NOTE — Telephone Encounter (Signed)
That is ok 

## 2020-09-19 NOTE — Telephone Encounter (Signed)
Ok to give verbal ok?

## 2020-09-20 NOTE — Telephone Encounter (Signed)
Lvm informing 

## 2020-09-25 ENCOUNTER — Telehealth: Payer: Self-pay | Admitting: *Deleted

## 2020-09-25 NOTE — Telephone Encounter (Signed)
30 day Ortho bundle call completed.

## 2020-10-07 ENCOUNTER — Encounter: Payer: Self-pay | Admitting: Orthopaedic Surgery

## 2020-10-07 ENCOUNTER — Ambulatory Visit: Payer: Self-pay

## 2020-10-07 ENCOUNTER — Ambulatory Visit (INDEPENDENT_AMBULATORY_CARE_PROVIDER_SITE_OTHER): Payer: Medicare Other

## 2020-10-07 ENCOUNTER — Ambulatory Visit (INDEPENDENT_AMBULATORY_CARE_PROVIDER_SITE_OTHER): Payer: Medicare Other | Admitting: Orthopaedic Surgery

## 2020-10-07 DIAGNOSIS — Z96653 Presence of artificial knee joint, bilateral: Secondary | ICD-10-CM

## 2020-10-07 DIAGNOSIS — M25572 Pain in left ankle and joints of left foot: Secondary | ICD-10-CM

## 2020-10-07 DIAGNOSIS — Z96642 Presence of left artificial hip joint: Secondary | ICD-10-CM

## 2020-10-07 MED ORDER — HYDROCODONE-ACETAMINOPHEN 5-325 MG PO TABS: 1.0000 | ORAL_TABLET | Freq: Four times a day (QID) | ORAL | 0 refills | Status: DC | PRN

## 2020-10-07 NOTE — Progress Notes (Signed)
The patient is now 6 weeks status post a left total hip arthroplasty direct anterior approach.  She has a history of bilateral knee replacements that were done elsewhere years ago.  She also has a history of a left ankle fusion.  She has been going through home health therapy and would like to transition to outpatient physical therapy.  She would like x-rays of both her knees today and her ankle.  She says this is also per the therapist because they felt like the knee was unstable since it was hurting but she does have chronic pain from balance issues from her hip all the way to her ankle and she is had recent anterior hip surgery that can affect the knee.  Again her ankle has a history of fusion on the left side.  Her left operative hip is moving well.  Both knees were assessed and showed no instability on my exam with full range of motion.  There is clicking of both knees to be expected from knee replacement surgery.  Neither knee has an effusion and both knees are ligamentously stable.  The left ankle is in a fixed and fused position.  Her incisions at her hip and knee and ankle are all well-healed.  X-rays of both total knee arthroplasties show well-seated implants with no evidence of loosening or complicating features.  Her left ankle shows a previous fusion of the ankle joint and the subtalar joint with 3 large screws are intact.  The fusion appears to be stable.  I gave her reassurance that her knee pain on the left side is likely related to her anterior hip replacement surgery had described how this is performed.  This also has contributed to her ankle pain.  Certainly there are balance and gait issues given the severity of her ankle fusion combined with bilateral knee replacements and a left hip replacement.  There are some mild arthritis in her right hip.  I agree with her trying some outpatient physical therapy to work on her balance and coordination and strengthening.  We will give her  prescription for that.  I would like to see her back in 4 weeks to see how she is doing overall.  No x-rays are needed at that visit.

## 2020-10-09 DIAGNOSIS — M25552 Pain in left hip: Secondary | ICD-10-CM | POA: Diagnosis not present

## 2020-11-04 ENCOUNTER — Other Ambulatory Visit: Payer: Self-pay

## 2020-11-04 ENCOUNTER — Encounter: Payer: Self-pay | Admitting: Orthopaedic Surgery

## 2020-11-04 ENCOUNTER — Ambulatory Visit (INDEPENDENT_AMBULATORY_CARE_PROVIDER_SITE_OTHER): Payer: Medicare Other | Admitting: Orthopaedic Surgery

## 2020-11-04 DIAGNOSIS — Z96642 Presence of left artificial hip joint: Secondary | ICD-10-CM

## 2020-11-04 MED ORDER — HYDROCODONE-ACETAMINOPHEN 5-325 MG PO TABS: 1.0000 | ORAL_TABLET | Freq: Four times a day (QID) | ORAL | 0 refills | Status: DC | PRN

## 2020-11-04 NOTE — Progress Notes (Signed)
The patient is now 10 weeks status post a left total hip arthroplasty.  The hip is doing well and she is very pleased with it.  She does ambulate with a rolling walker.  She has a history of bilateral knee replacements done elsewhere and a left ankle fusion.  This does throw off her gait.  She also has chronic rib pain and did try outpatient physical therapy recently but that flared up her rib pain.  She is trying to walk more on her own and is going to [employer] to actually practice walking with balance and coordination using a shopping cart.  Her left operative hip moves smoothly and fluidly.  She is experiencing some right hip with motion.  From my standpoint, we do not need to see her back for 3 months.  At that visit I like a standing low AP pelvis and a lateral of both hips and she is dealing with right hip issues.  All question concerns were answered and addressed.

## 2020-11-27 ENCOUNTER — Ambulatory Visit (INDEPENDENT_AMBULATORY_CARE_PROVIDER_SITE_OTHER): Payer: Medicare Other | Admitting: Registered Nurse

## 2020-11-27 ENCOUNTER — Other Ambulatory Visit: Payer: Self-pay

## 2020-11-27 ENCOUNTER — Encounter: Payer: Self-pay | Admitting: Registered Nurse

## 2020-11-27 VITALS — BP 116/78 | HR 73 | Temp 98.4°F | Resp 18 | Ht 66.5 in | Wt 251.0 lb

## 2020-11-27 DIAGNOSIS — H60503 Unspecified acute noninfective otitis externa, bilateral: Secondary | ICD-10-CM | POA: Diagnosis not present

## 2020-11-27 DIAGNOSIS — H6123 Impacted cerumen, bilateral: Secondary | ICD-10-CM | POA: Diagnosis not present

## 2020-11-27 DIAGNOSIS — M159 Polyosteoarthritis, unspecified: Secondary | ICD-10-CM

## 2020-11-27 DIAGNOSIS — M8949 Other hypertrophic osteoarthropathy, multiple sites: Secondary | ICD-10-CM | POA: Diagnosis not present

## 2020-11-27 DIAGNOSIS — E559 Vitamin D deficiency, unspecified: Secondary | ICD-10-CM

## 2020-11-27 DIAGNOSIS — Z8639 Personal history of other endocrine, nutritional and metabolic disease: Secondary | ICD-10-CM | POA: Diagnosis not present

## 2020-11-27 DIAGNOSIS — I350 Nonrheumatic aortic (valve) stenosis: Secondary | ICD-10-CM

## 2020-11-27 LAB — COMPREHENSIVE METABOLIC PANEL
ALT: 9 U/L (ref 0–35)
AST: 17 U/L (ref 0–37)
Albumin: 4.1 g/dL (ref 3.5–5.2)
Alkaline Phosphatase: 105 U/L (ref 39–117)
BUN: 16 mg/dL (ref 6–23)
CO2: 27 mEq/L (ref 19–32)
Calcium: 9.6 mg/dL (ref 8.4–10.5)
Chloride: 104 mEq/L (ref 96–112)
Creatinine, Ser: 0.61 mg/dL (ref 0.40–1.20)
GFR: 91.18 mL/min (ref >60.00)
Glucose, Bld: 89 mg/dL (ref 70–99)
Potassium: 4.5 mEq/L (ref 3.5–5.1)
Sodium: 139 mEq/L (ref 135–145)
Total Bilirubin: 0.6 mg/dL (ref 0.2–1.2)
Total Protein: 6.4 g/dL (ref 6.0–8.3)

## 2020-11-27 LAB — LIPID PANEL
Cholesterol: 256 mg/dL — ABNORMAL HIGH (ref 0–200)
HDL: 74.6 mg/dL (ref >39.00)
LDL Cholesterol: 167 mg/dL — ABNORMAL HIGH (ref 0–99)
NonHDL: 181.66
Total CHOL/HDL Ratio: 3
Triglycerides: 72 mg/dL (ref 0.0–149.0)
VLDL: 14.4 mg/dL (ref 0.0–40.0)

## 2020-11-27 LAB — HEMOGLOBIN A1C: Hgb A1c MFr Bld: 5.5 % (ref 4.6–6.5)

## 2020-11-27 LAB — VITAMIN D 25 HYDROXY (VIT D DEFICIENCY, FRACTURES): VITD: 59.49 ng/mL (ref 30.00–100.00)

## 2020-11-27 MED ORDER — HYDROCORTISONE-ACETIC ACID 1-2 % OT SOLN
3.0000 [drp] | Freq: Three times a day (TID) | OTIC | 0 refills | Status: DC
Start: 2020-11-27 — End: 2022-04-08

## 2020-11-27 MED ORDER — HYDROCODONE-ACETAMINOPHEN 5-325 MG PO TABS: 1.0000 | ORAL_TABLET | Freq: Four times a day (QID) | ORAL | 0 refills | Status: DC | PRN
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
## 2020-11-27 NOTE — Patient Instructions (Addendum)
[name] to meet you  Medication refilled - give me a shout when you are running low and we can continue this. Can also explore other options if we want to step down from this potency Ok to continue OTC analgesic use as well - just keep an eye on tylenol daily dosing, I don't want you to exceed 3000mg  daily  Encourage continued supplement use  Can consider Wegovy (semaglutide) for weight loss in the future - I am a big fan of this medication class - see attached for reliable patient info.   Let's plan to touch base in around 3 mo, sooner if labs indicate  Thank you  Rich     If you have lab work done today you will be contacted with your lab results within the next 2 weeks.  If you have not heard from [place] then please contact us. The fastest way to get your results is to register for My Chart.   IF you received an x-ray today, you will receive an invoice from [facility]. Please contact [facility] at [phone] with questions or concerns regarding your invoice.   IF you received labwork today, you will receive an invoice from [facility]. Please contact [facility] at [phone] with questions or concerns regarding your invoice.   Our billing staff will not be able to assist you with questions regarding bills from these companies.  You will be contacted with the lab results as soon as they are available. The fastest way to get your results is to activate your My Chart account. Instructions are located on the last page of this paperwork. If you have not heard from [place] regarding the results in 2 weeks, please contact this office.

## 2020-11-28 LAB — CBC WITH DIFFERENTIAL/PLATELET
Basophils Absolute: 0.1 10*3/uL (ref 0.0–0.1)
Basophils Relative: 1.2 % (ref 0.0–3.0)
Eosinophils Absolute: 0.1 10*3/uL (ref 0.0–0.7)
Eosinophils Relative: 1.7 % (ref 0.0–5.0)
HCT: 42.5 % (ref 36.0–46.0)
Hemoglobin: 14.5 g/dL (ref 12.0–15.0)
Lymphocytes Relative: 30.6 % (ref 12.0–46.0)
Lymphs Abs: 1.4 10*3/uL (ref 0.7–4.0)
MCHC: 34.1 g/dL (ref 30.0–36.0)
MCV: 87.4 fl (ref 78.0–100.0)
Monocytes Absolute: 0.3 10*3/uL (ref 0.1–1.0)
Monocytes Relative: 6.7 % (ref 3.0–12.0)
Neutro Abs: 2.8 10*3/uL (ref 1.4–7.7)
Neutrophils Relative %: 59.8 % (ref 43.0–77.0)
Platelets: 177 10*3/uL (ref 150.0–400.0)
RBC: 4.86 Mil/uL (ref 3.87–5.11)
RDW: 13.9 % (ref 11.5–15.5)
WBC: 4.7 10*3/uL (ref 4.0–10.5)

## 2020-12-13 DIAGNOSIS — H0100A Unspecified blepharitis right eye, upper and lower eyelids: Secondary | ICD-10-CM | POA: Diagnosis not present

## 2020-12-13 DIAGNOSIS — H2513 Age-related nuclear cataract, bilateral: Secondary | ICD-10-CM | POA: Diagnosis not present

## 2020-12-13 DIAGNOSIS — H18593 Other hereditary corneal dystrophies, bilateral: Secondary | ICD-10-CM | POA: Diagnosis not present

## 2020-12-13 DIAGNOSIS — H524 Presbyopia: Secondary | ICD-10-CM | POA: Diagnosis not present

## 2020-12-21 ENCOUNTER — Encounter: Payer: Self-pay | Admitting: Registered Nurse

## 2021-01-02 DIAGNOSIS — M546 Pain in thoracic spine: Secondary | ICD-10-CM | POA: Diagnosis not present

## 2021-01-02 DIAGNOSIS — M9903 Segmental and somatic dysfunction of lumbar region: Secondary | ICD-10-CM | POA: Diagnosis not present

## 2021-01-02 DIAGNOSIS — M5417 Radiculopathy, lumbosacral region: Secondary | ICD-10-CM | POA: Diagnosis not present

## 2021-01-02 DIAGNOSIS — M542 Cervicalgia: Secondary | ICD-10-CM | POA: Diagnosis not present

## 2021-01-02 DIAGNOSIS — M9902 Segmental and somatic dysfunction of thoracic region: Secondary | ICD-10-CM | POA: Diagnosis not present

## 2021-01-02 DIAGNOSIS — M5137 Other intervertebral disc degeneration, lumbosacral region: Secondary | ICD-10-CM | POA: Diagnosis not present

## 2021-01-02 DIAGNOSIS — M62838 Other muscle spasm: Secondary | ICD-10-CM | POA: Diagnosis not present

## 2021-01-02 DIAGNOSIS — M9901 Segmental and somatic dysfunction of cervical region: Secondary | ICD-10-CM | POA: Diagnosis not present

## 2021-01-08 ENCOUNTER — Telehealth: Payer: Self-pay | Admitting: *Deleted

## 2021-01-08 NOTE — Telephone Encounter (Signed)
Ortho bundle 90 day call completed. 

## 2021-01-09 DIAGNOSIS — M9903 Segmental and somatic dysfunction of lumbar region: Secondary | ICD-10-CM | POA: Diagnosis not present

## 2021-01-09 DIAGNOSIS — M5417 Radiculopathy, lumbosacral region: Secondary | ICD-10-CM | POA: Diagnosis not present

## 2021-01-09 DIAGNOSIS — M542 Cervicalgia: Secondary | ICD-10-CM | POA: Diagnosis not present

## 2021-01-09 DIAGNOSIS — M5137 Other intervertebral disc degeneration, lumbosacral region: Secondary | ICD-10-CM | POA: Diagnosis not present

## 2021-01-09 DIAGNOSIS — M9901 Segmental and somatic dysfunction of cervical region: Secondary | ICD-10-CM | POA: Diagnosis not present

## 2021-01-09 DIAGNOSIS — M9902 Segmental and somatic dysfunction of thoracic region: Secondary | ICD-10-CM | POA: Diagnosis not present

## 2021-01-09 DIAGNOSIS — M546 Pain in thoracic spine: Secondary | ICD-10-CM | POA: Diagnosis not present

## 2021-01-09 DIAGNOSIS — M62838 Other muscle spasm: Secondary | ICD-10-CM | POA: Diagnosis not present

## 2021-01-16 DIAGNOSIS — M5417 Radiculopathy, lumbosacral region: Secondary | ICD-10-CM | POA: Diagnosis not present

## 2021-01-16 DIAGNOSIS — M546 Pain in thoracic spine: Secondary | ICD-10-CM | POA: Diagnosis not present

## 2021-01-16 DIAGNOSIS — M5137 Other intervertebral disc degeneration, lumbosacral region: Secondary | ICD-10-CM | POA: Diagnosis not present

## 2021-01-16 DIAGNOSIS — M9903 Segmental and somatic dysfunction of lumbar region: Secondary | ICD-10-CM | POA: Diagnosis not present

## 2021-01-16 DIAGNOSIS — M62838 Other muscle spasm: Secondary | ICD-10-CM | POA: Diagnosis not present

## 2021-01-16 DIAGNOSIS — M9901 Segmental and somatic dysfunction of cervical region: Secondary | ICD-10-CM | POA: Diagnosis not present

## 2021-01-16 DIAGNOSIS — M9902 Segmental and somatic dysfunction of thoracic region: Secondary | ICD-10-CM | POA: Diagnosis not present

## 2021-01-16 DIAGNOSIS — M542 Cervicalgia: Secondary | ICD-10-CM | POA: Diagnosis not present

## 2021-01-30 DIAGNOSIS — M542 Cervicalgia: Secondary | ICD-10-CM | POA: Diagnosis not present

## 2021-01-30 DIAGNOSIS — M5137 Other intervertebral disc degeneration, lumbosacral region: Secondary | ICD-10-CM | POA: Diagnosis not present

## 2021-01-30 DIAGNOSIS — M5417 Radiculopathy, lumbosacral region: Secondary | ICD-10-CM | POA: Diagnosis not present

## 2021-01-30 DIAGNOSIS — M546 Pain in thoracic spine: Secondary | ICD-10-CM | POA: Diagnosis not present

## 2021-01-30 DIAGNOSIS — M9902 Segmental and somatic dysfunction of thoracic region: Secondary | ICD-10-CM | POA: Diagnosis not present

## 2021-01-30 DIAGNOSIS — M9901 Segmental and somatic dysfunction of cervical region: Secondary | ICD-10-CM | POA: Diagnosis not present

## 2021-01-30 DIAGNOSIS — M9903 Segmental and somatic dysfunction of lumbar region: Secondary | ICD-10-CM | POA: Diagnosis not present

## 2021-01-30 DIAGNOSIS — M62838 Other muscle spasm: Secondary | ICD-10-CM | POA: Diagnosis not present

## 2021-02-13 DIAGNOSIS — M542 Cervicalgia: Secondary | ICD-10-CM | POA: Diagnosis not present

## 2021-02-13 DIAGNOSIS — M62838 Other muscle spasm: Secondary | ICD-10-CM | POA: Diagnosis not present

## 2021-02-13 DIAGNOSIS — M5417 Radiculopathy, lumbosacral region: Secondary | ICD-10-CM | POA: Diagnosis not present

## 2021-02-13 DIAGNOSIS — M5137 Other intervertebral disc degeneration, lumbosacral region: Secondary | ICD-10-CM | POA: Diagnosis not present

## 2021-02-13 DIAGNOSIS — M9901 Segmental and somatic dysfunction of cervical region: Secondary | ICD-10-CM | POA: Diagnosis not present

## 2021-02-13 DIAGNOSIS — M9903 Segmental and somatic dysfunction of lumbar region: Secondary | ICD-10-CM | POA: Diagnosis not present

## 2021-02-13 DIAGNOSIS — M546 Pain in thoracic spine: Secondary | ICD-10-CM | POA: Diagnosis not present

## 2021-02-13 DIAGNOSIS — M9902 Segmental and somatic dysfunction of thoracic region: Secondary | ICD-10-CM | POA: Diagnosis not present

## 2021-02-20 ENCOUNTER — Telehealth: Payer: Self-pay | Admitting: Family Medicine

## 2021-02-20 NOTE — Telephone Encounter (Signed)
Received request for records from [facility]. I emailed records.

## 2021-03-06 ENCOUNTER — Ambulatory Visit (INDEPENDENT_AMBULATORY_CARE_PROVIDER_SITE_OTHER): Payer: Medicare Other

## 2021-03-06 ENCOUNTER — Ambulatory Visit (INDEPENDENT_AMBULATORY_CARE_PROVIDER_SITE_OTHER): Payer: Medicare Other | Admitting: Orthopaedic Surgery

## 2021-03-06 ENCOUNTER — Encounter: Payer: Self-pay | Admitting: Orthopaedic Surgery

## 2021-03-06 ENCOUNTER — Other Ambulatory Visit: Payer: Self-pay

## 2021-03-06 DIAGNOSIS — Z96642 Presence of left artificial hip joint: Secondary | ICD-10-CM

## 2021-03-06 DIAGNOSIS — Z96653 Presence of artificial knee joint, bilateral: Secondary | ICD-10-CM

## 2021-03-06 NOTE — Progress Notes (Signed)
The patient is now just over 6 months status post a left total hip arthroplasty.  She does have a history remotely of a left knee replacement as well as a left ankle fusion.  She has been having some right hip pain.  She ambulates with a rolling walker.  Her left operative hip seems to be doing well.  It moves smoothly and fluidly with no pain at all.  Her right hip does move smoothly and fluidly but there is some pain in the groin.  An AP pelvis and lateral of the left hip shows a well-seated total hip arthroplasty with no complicating features.  The right hip does have mild arthritic changes.  Have recommended she see her primary care physician Dr. Junius Roads who she sees regularly.  I have given her a note to give him to consider an ultrasound-guided steroid injection in her right hip joint.  I would also like to send her to outpatient physical therapy at [facility] with current outpatient rehab there to work on bilateral lower extremity strengthening with working on her gait and balance and coordination.  From my standpoint, I will see her back in 3 months to see how she is doing overall but no x-rays are needed.

## 2021-03-09 NOTE — Telephone Encounter (Signed)
This concern has been previously addressed by myself and/or another provider.  If they patient has ongoing concerns, they can contact me at their convenience.  Thank you,  Rich Laniya Friedl, NP 

## 2021-04-03 ENCOUNTER — Ambulatory Visit: Payer: Medicare Other | Attending: Orthopaedic Surgery | Admitting: Physical Therapy

## 2021-04-03 ENCOUNTER — Encounter: Payer: Self-pay | Admitting: Physical Therapy

## 2021-04-03 ENCOUNTER — Other Ambulatory Visit: Payer: Self-pay

## 2021-04-03 DIAGNOSIS — Z96642 Presence of left artificial hip joint: Secondary | ICD-10-CM | POA: Insufficient documentation

## 2021-04-03 DIAGNOSIS — M6281 Muscle weakness (generalized): Secondary | ICD-10-CM | POA: Insufficient documentation

## 2021-04-03 DIAGNOSIS — R262 Difficulty in walking, not elsewhere classified: Secondary | ICD-10-CM | POA: Diagnosis not present

## 2021-04-03 DIAGNOSIS — Z96653 Presence of artificial knee joint, bilateral: Secondary | ICD-10-CM | POA: Insufficient documentation

## 2021-04-03 NOTE — Patient Instructions (Signed)
Access Code: [email or web address] URL: [email or web address] Date: 04/03/2021 Prepared by: Glenetta Hew  Exercises Sit to Stand with Counter Support - 3 x daily - 7 x weekly - 2 sets - 10 reps
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
## 2021-04-07 ENCOUNTER — Ambulatory Visit: Payer: Medicare Other

## 2021-04-07 ENCOUNTER — Other Ambulatory Visit: Payer: Self-pay

## 2021-04-07 DIAGNOSIS — R262 Difficulty in walking, not elsewhere classified: Secondary | ICD-10-CM

## 2021-04-07 DIAGNOSIS — M6281 Muscle weakness (generalized): Secondary | ICD-10-CM

## 2021-04-07 DIAGNOSIS — Z96642 Presence of left artificial hip joint: Secondary | ICD-10-CM | POA: Diagnosis not present

## 2021-04-07 DIAGNOSIS — Z96653 Presence of artificial knee joint, bilateral: Secondary | ICD-10-CM | POA: Diagnosis not present
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
## 2021-04-07 NOTE — Patient Instructions (Signed)
Access Code: [email or web address] URL: [email or web address] Date: 04/07/2021 Prepared by: Clarene Essex  Exercises Sit to Stand with Counter Support - 3 x daily - 7 x weekly - 2 sets - 10 reps Seated Long Arc Quad - 1 x daily - 7 x weekly - 3 sets - 10 reps Supine Bridge - 1 x daily - 7 x weekly - 3 sets - 10 reps Supine Hip Adduction Isometric with Ball - 1 x daily - 7 x weekly - 3 sets - 10 reps

## 2021-04-09 ENCOUNTER — Other Ambulatory Visit: Payer: Self-pay

## 2021-04-09 ENCOUNTER — Ambulatory Visit: Payer: Medicare Other | Admitting: Physical Therapy

## 2021-04-09 ENCOUNTER — Encounter: Payer: Self-pay | Admitting: Physical Therapy

## 2021-04-09 DIAGNOSIS — M6281 Muscle weakness (generalized): Secondary | ICD-10-CM | POA: Diagnosis not present

## 2021-04-09 DIAGNOSIS — R262 Difficulty in walking, not elsewhere classified: Secondary | ICD-10-CM | POA: Diagnosis not present

## 2021-04-09 DIAGNOSIS — Z96642 Presence of left artificial hip joint: Secondary | ICD-10-CM | POA: Diagnosis not present

## 2021-04-09 DIAGNOSIS — Z96653 Presence of artificial knee joint, bilateral: Secondary | ICD-10-CM | POA: Diagnosis not present
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
## 2021-04-14 ENCOUNTER — Ambulatory Visit: Payer: Medicare Other | Admitting: Physical Therapy

## 2021-04-14 ENCOUNTER — Encounter: Payer: Self-pay | Admitting: Physical Therapy

## 2021-04-14 ENCOUNTER — Other Ambulatory Visit: Payer: Self-pay

## 2021-04-14 DIAGNOSIS — M6281 Muscle weakness (generalized): Secondary | ICD-10-CM

## 2021-04-14 DIAGNOSIS — Z96653 Presence of artificial knee joint, bilateral: Secondary | ICD-10-CM | POA: Diagnosis not present

## 2021-04-14 DIAGNOSIS — R262 Difficulty in walking, not elsewhere classified: Secondary | ICD-10-CM

## 2021-04-14 DIAGNOSIS — Z96642 Presence of left artificial hip joint: Secondary | ICD-10-CM | POA: Diagnosis not present

## 2021-04-14 NOTE — Patient Instructions (Signed)
Access Code: [email or web address] URL: [email or web address] Date: 04/14/2021 Prepared by: Glenetta Hew  Exercises Seated March - 1 x daily - 7 x weekly - 2 sets - 10 reps Seated Small Alternating Straight Leg Lifts with Heel Touch - 1 x daily - 7 x weekly - 2 sets - 10 reps Seated Hip Abduction - 1 x daily - 7 x weekly - 2 sets - 10 reps Standing Hip Flexor Stretch - 1 x daily - 7 x weekly - 1 sets - 3 reps - 15 sec hold Standing Hip Extension with Counter Support - 1 x daily - 7 x weekly - 2 sets - 10 reps
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
## 2021-04-17 ENCOUNTER — Ambulatory Visit: Payer: Medicare Other | Attending: Orthopaedic Surgery

## 2021-04-17 ENCOUNTER — Other Ambulatory Visit: Payer: Self-pay

## 2021-04-17 DIAGNOSIS — M6281 Muscle weakness (generalized): Secondary | ICD-10-CM | POA: Diagnosis not present

## 2021-04-17 DIAGNOSIS — R262 Difficulty in walking, not elsewhere classified: Secondary | ICD-10-CM | POA: Insufficient documentation
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
## 2021-04-21 ENCOUNTER — Encounter: Payer: Medicare Other | Admitting: Physical Therapy

## 2021-04-24 DIAGNOSIS — M9903 Segmental and somatic dysfunction of lumbar region: Secondary | ICD-10-CM | POA: Diagnosis not present

## 2021-04-24 DIAGNOSIS — M62838 Other muscle spasm: Secondary | ICD-10-CM | POA: Diagnosis not present

## 2021-04-24 DIAGNOSIS — M9902 Segmental and somatic dysfunction of thoracic region: Secondary | ICD-10-CM | POA: Diagnosis not present

## 2021-04-24 DIAGNOSIS — M5417 Radiculopathy, lumbosacral region: Secondary | ICD-10-CM | POA: Diagnosis not present

## 2021-04-24 DIAGNOSIS — M542 Cervicalgia: Secondary | ICD-10-CM | POA: Diagnosis not present

## 2021-04-24 DIAGNOSIS — M9901 Segmental and somatic dysfunction of cervical region: Secondary | ICD-10-CM | POA: Diagnosis not present

## 2021-04-24 DIAGNOSIS — M5137 Other intervertebral disc degeneration, lumbosacral region: Secondary | ICD-10-CM | POA: Diagnosis not present

## 2021-04-24 DIAGNOSIS — M546 Pain in thoracic spine: Secondary | ICD-10-CM | POA: Diagnosis not present

## 2021-04-28 ENCOUNTER — Encounter: Payer: Medicare Other | Admitting: Physical Therapy

## 2021-05-01 ENCOUNTER — Encounter: Payer: Medicare Other | Admitting: Physical Therapy

## 2021-05-05 ENCOUNTER — Ambulatory Visit: Payer: Medicare Other | Admitting: Physical Therapy

## 2021-05-07 ENCOUNTER — Encounter: Payer: Medicare Other | Admitting: Physical Therapy

## 2021-05-27 ENCOUNTER — Encounter: Payer: Self-pay | Admitting: Orthopaedic Surgery

## 2021-06-05 ENCOUNTER — Ambulatory Visit: Payer: Medicare Other | Admitting: Orthopaedic Surgery

## 2021-08-31 IMAGING — DX DG PORTABLE PELVIS
1 series · 1 of 1 positions shown · non-contrast
Comparison: Intraoperative radiographs 08/23/2020.

CLINICAL DATA: Status post left total hip replacement. Anterior
Bir Ali.

EXAM:
PORTABLE PELVIS 1-2 VIEWS

[pelvis ap]
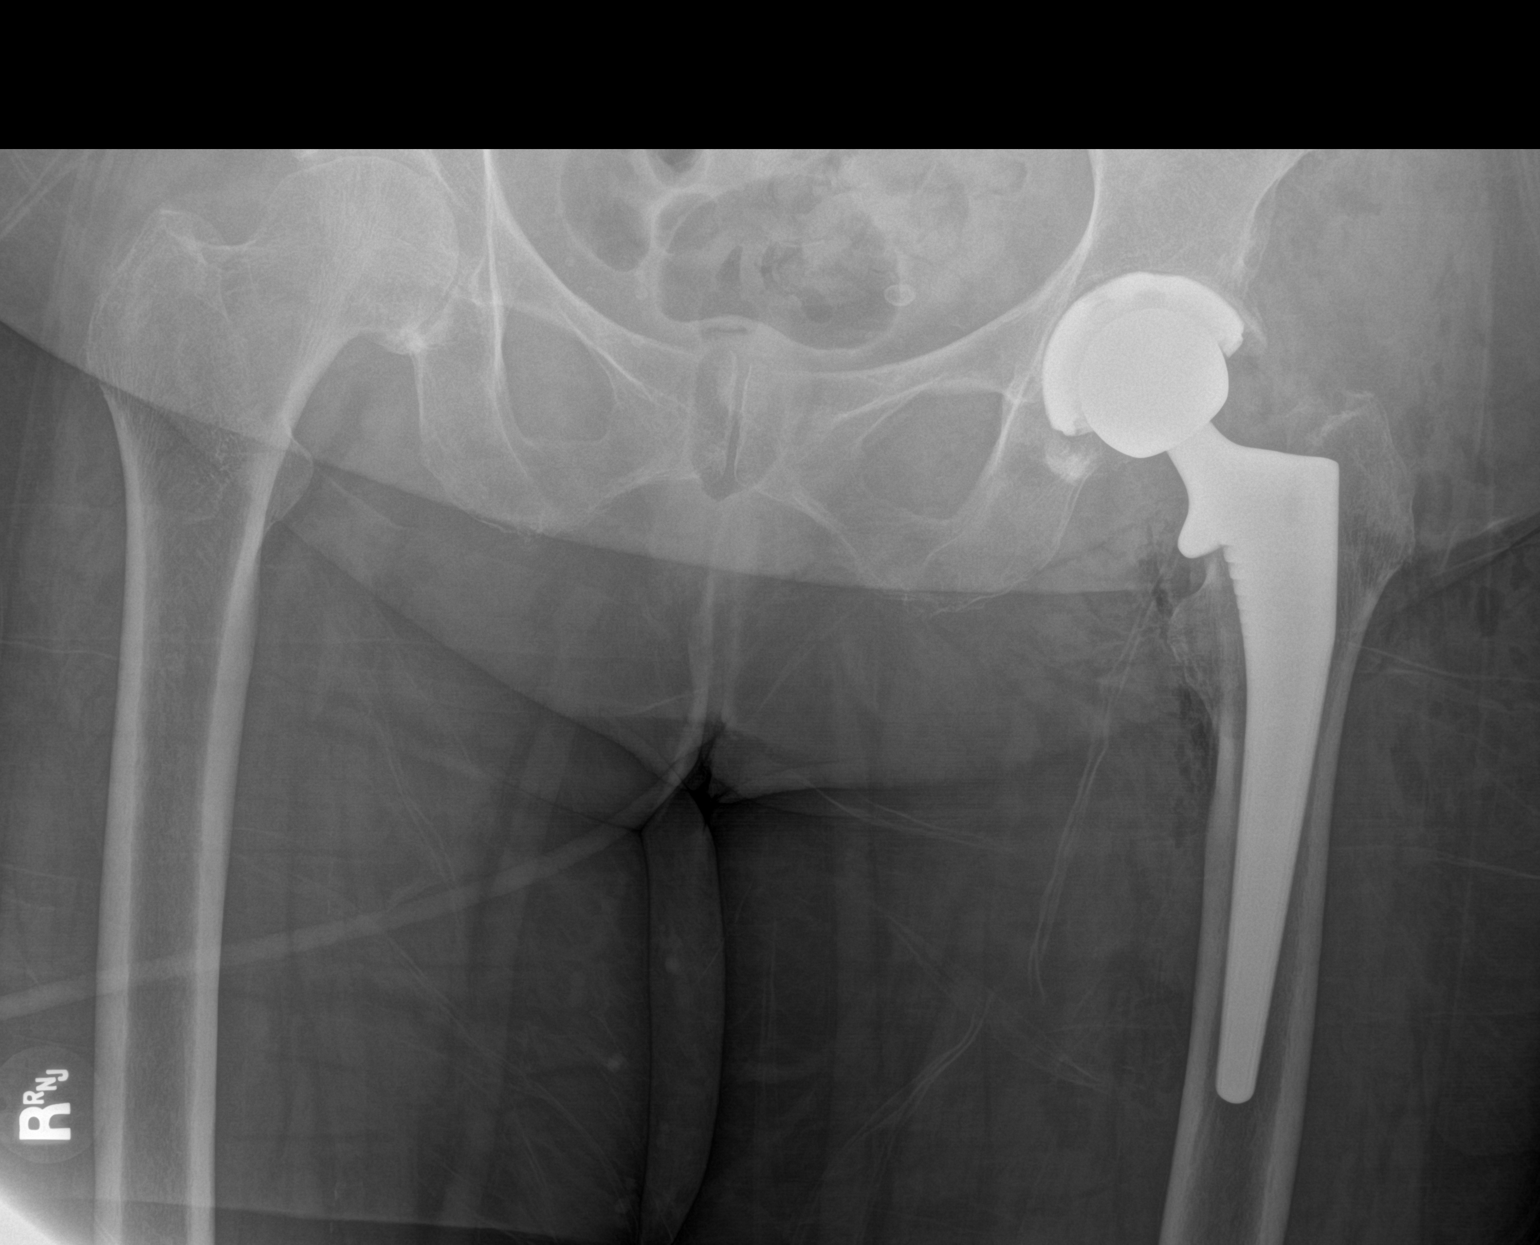

[1 of 1 positions shown; findings below may reference images not displayed]

FINDINGS: Single AP view demonstrates left total hip arthroplasty. Components
are stable in position. No acute fracture is present. Foley catheter
is in place.
IMPRESSION: Left total hip arthroplasty without radiographic evidence for
complication.

## 2021-08-31 IMAGING — RF DG C-ARM 1-60 MIN-NO REPORT
1 series · 3 of 3 positions shown · non-contrast
Comparison: April 04, 2020.

CLINICAL DATA: Left hip replacement.

EXAM:
OPERATIVE left HIP (WITH PELVIS IF PERFORMED) 3 VIEWS
TECHNIQUE: Fluoroscopic spot image(s) were submitted for interpretation
post-operatively.
Radiation exposure index: 3.2381 mGy.

[Series 1: unknown protocol · 0.20mm/px · 3 of 3 slices shown]
[im 1/3]
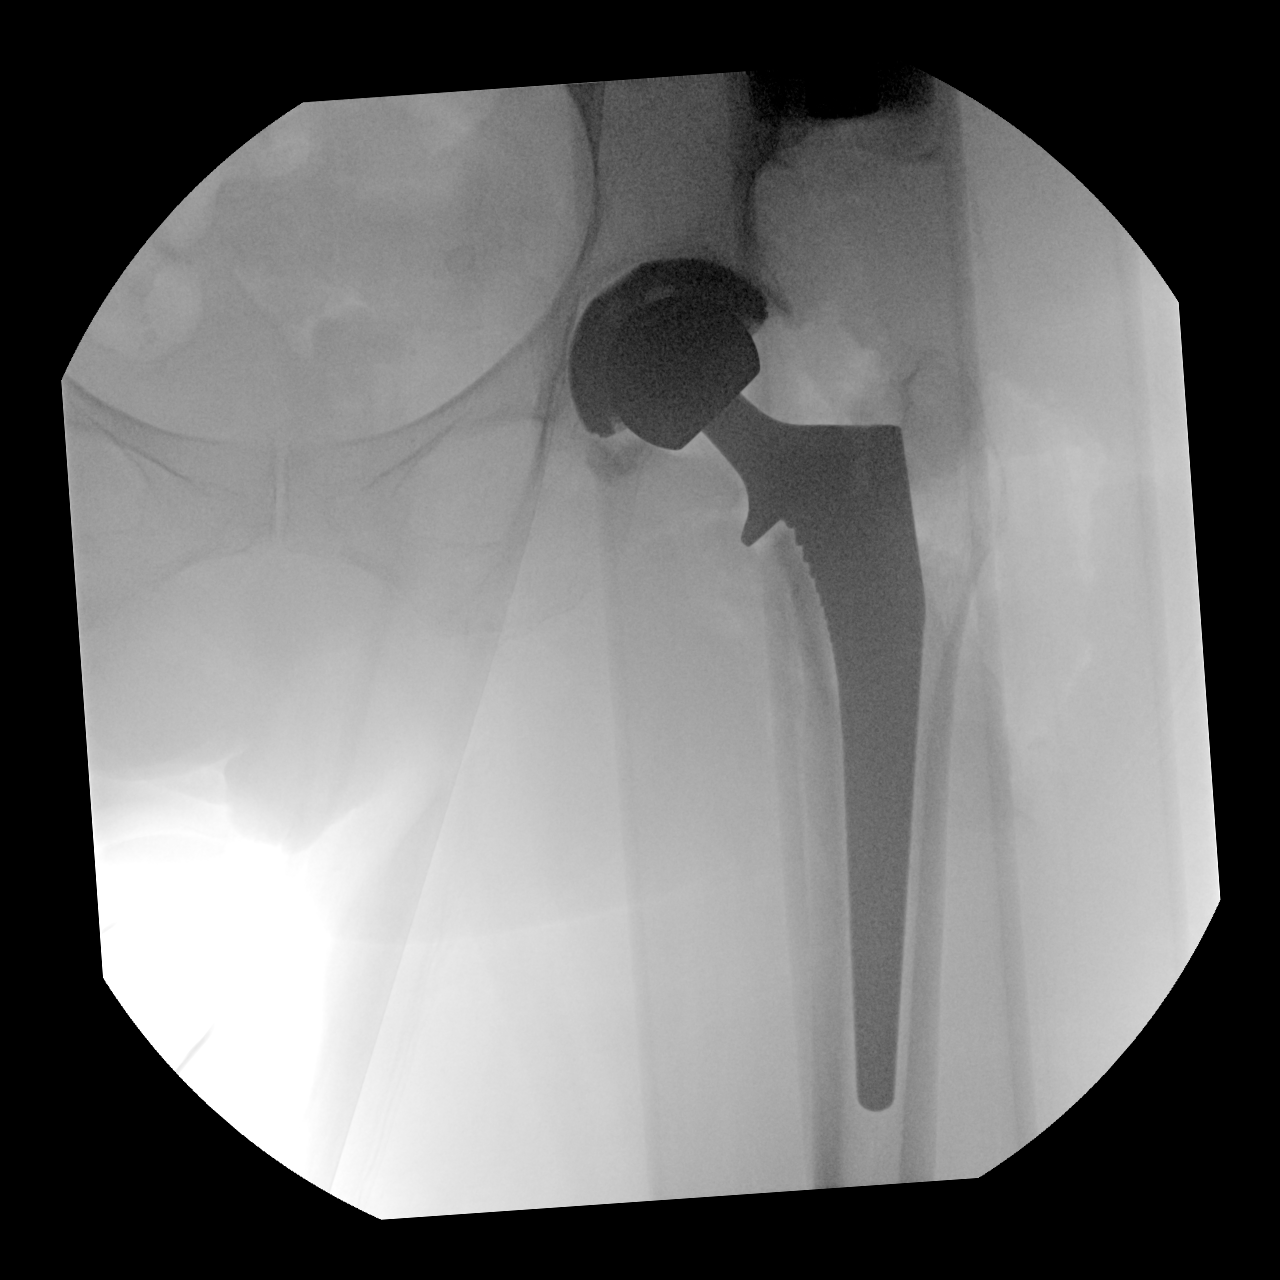
[im 2/3]
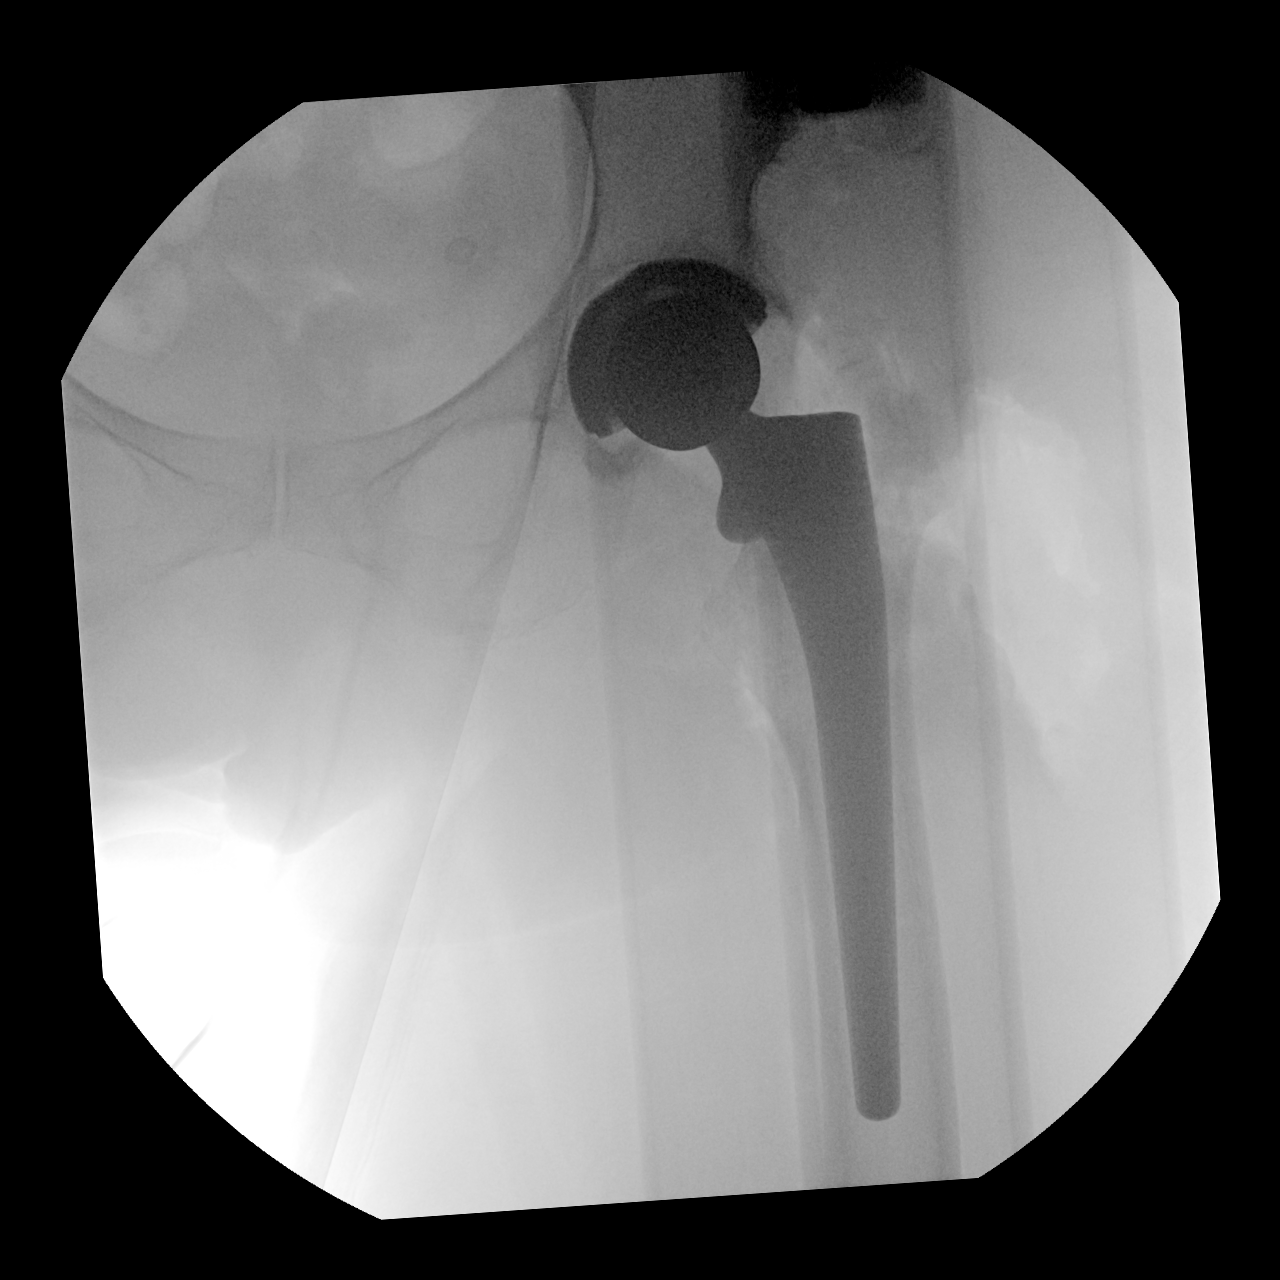
[im 3/3]
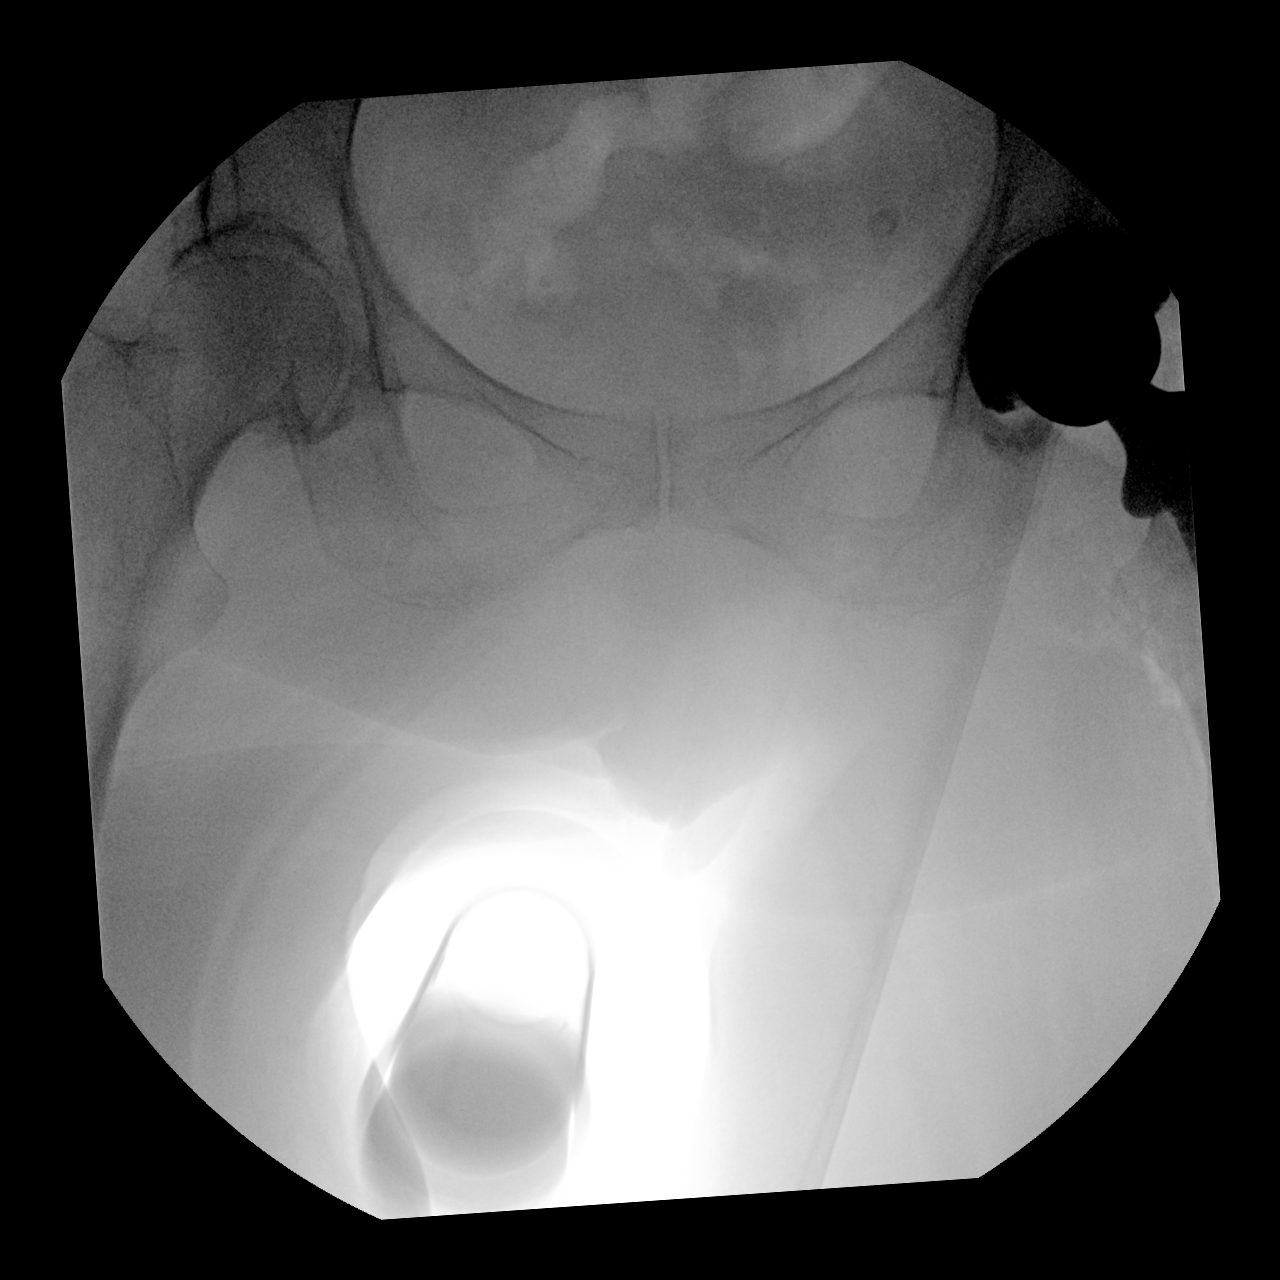

[3 of 3 positions shown; findings below may reference images not displayed]

FINDINGS: Three intraoperative fluoroscopic images were obtained of the left
hip. The left acetabular and femoral components are well situated.
IMPRESSION: Fluoroscopic guidance provided during left total hip arthroplasty.

## 2021-08-31 IMAGING — RF DG HIP (WITH PELVIS) OPERATIVE*L*
1 series · 3 of 3 positions shown · non-contrast
Comparison: April 04, 2020.

CLINICAL DATA: Left hip replacement.

EXAM:
OPERATIVE left HIP (WITH PELVIS IF PERFORMED) 3 VIEWS
TECHNIQUE: Fluoroscopic spot image(s) were submitted for interpretation
post-operatively.
Radiation exposure index: 3.2381 mGy.

[Series 1: unknown protocol · 0.20mm/px · 3 of 3 slices shown]
[im 1/3]
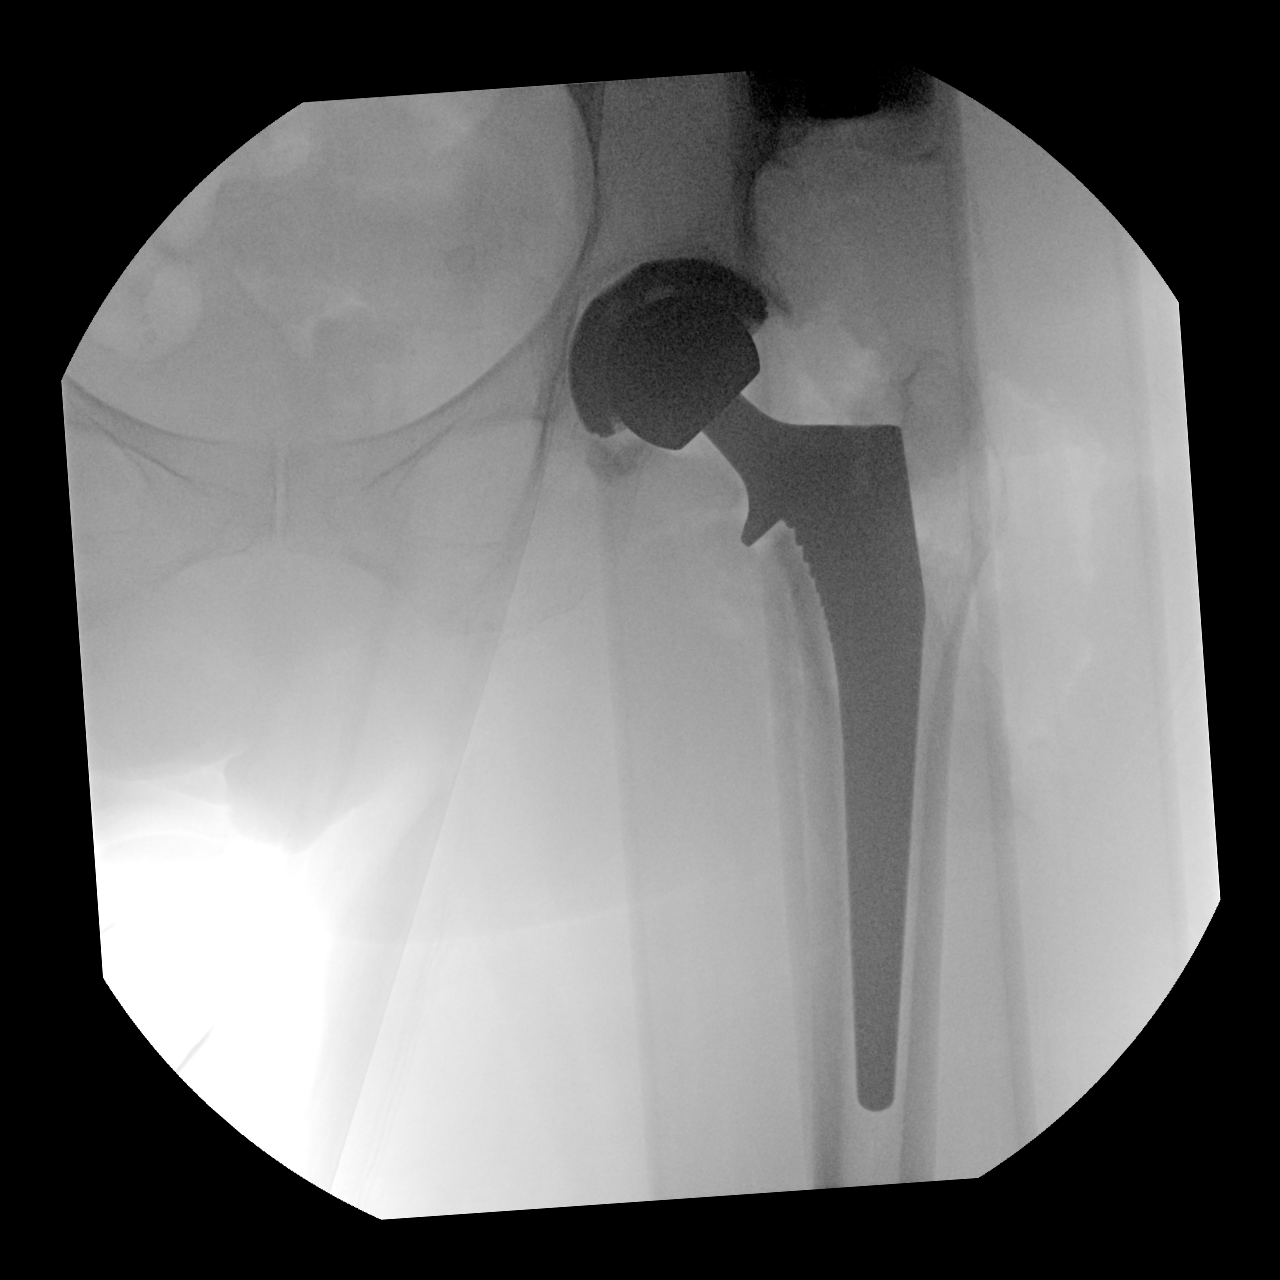
[im 2/3]
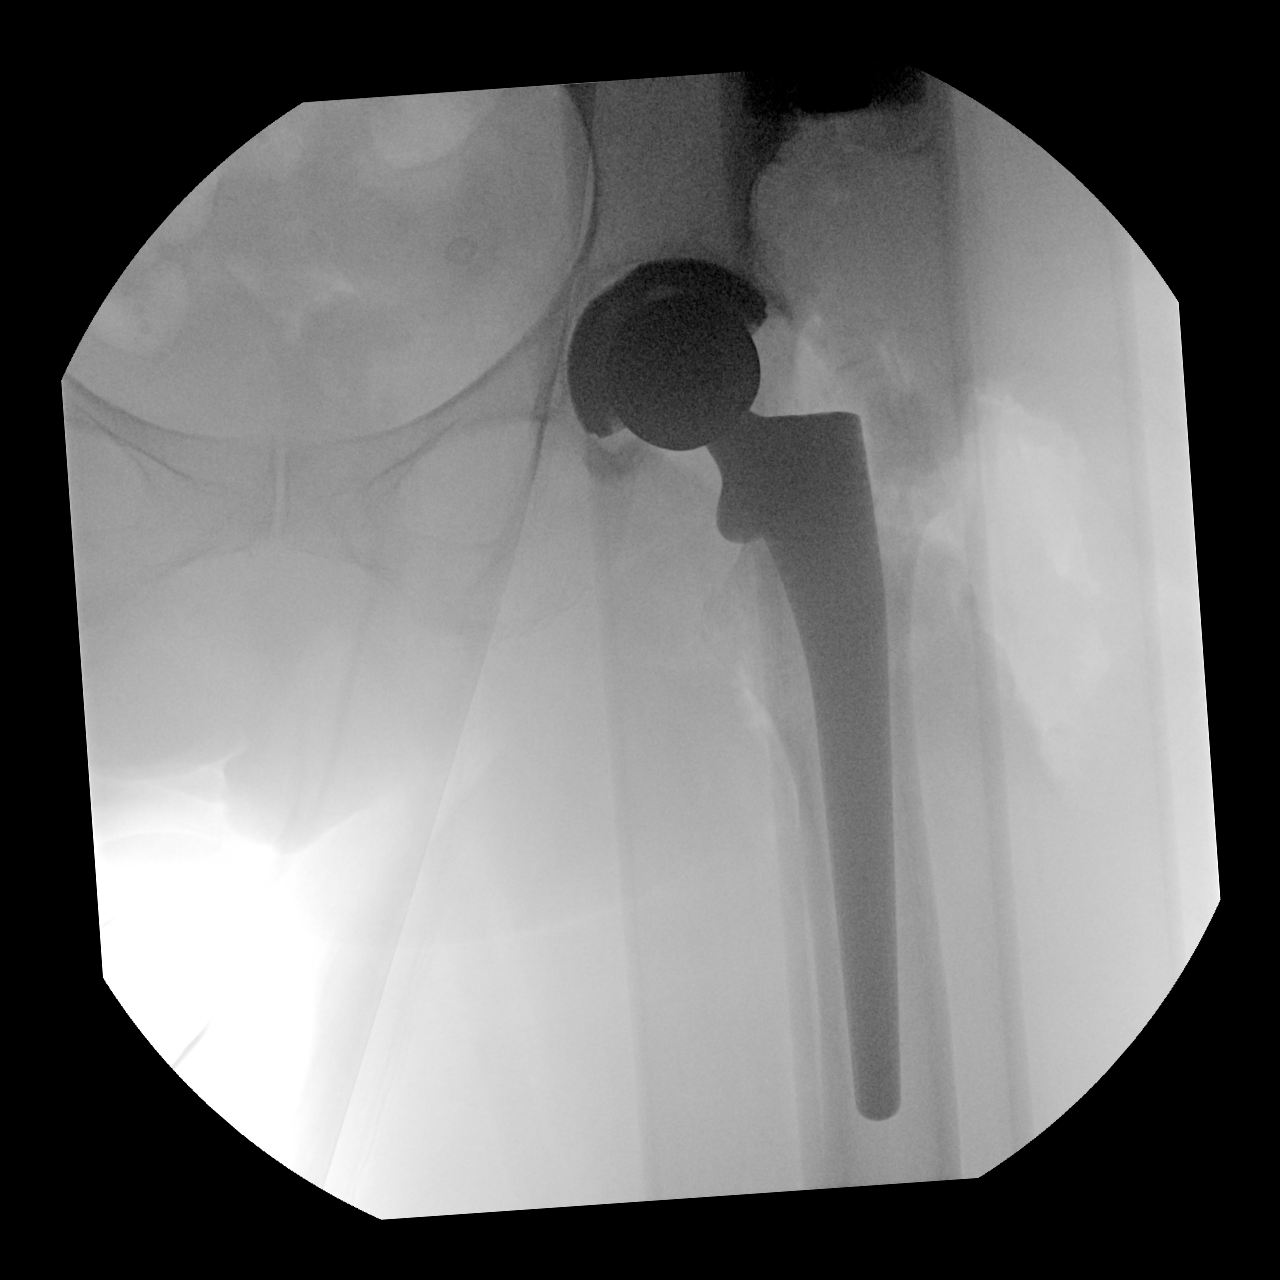
[im 3/3]
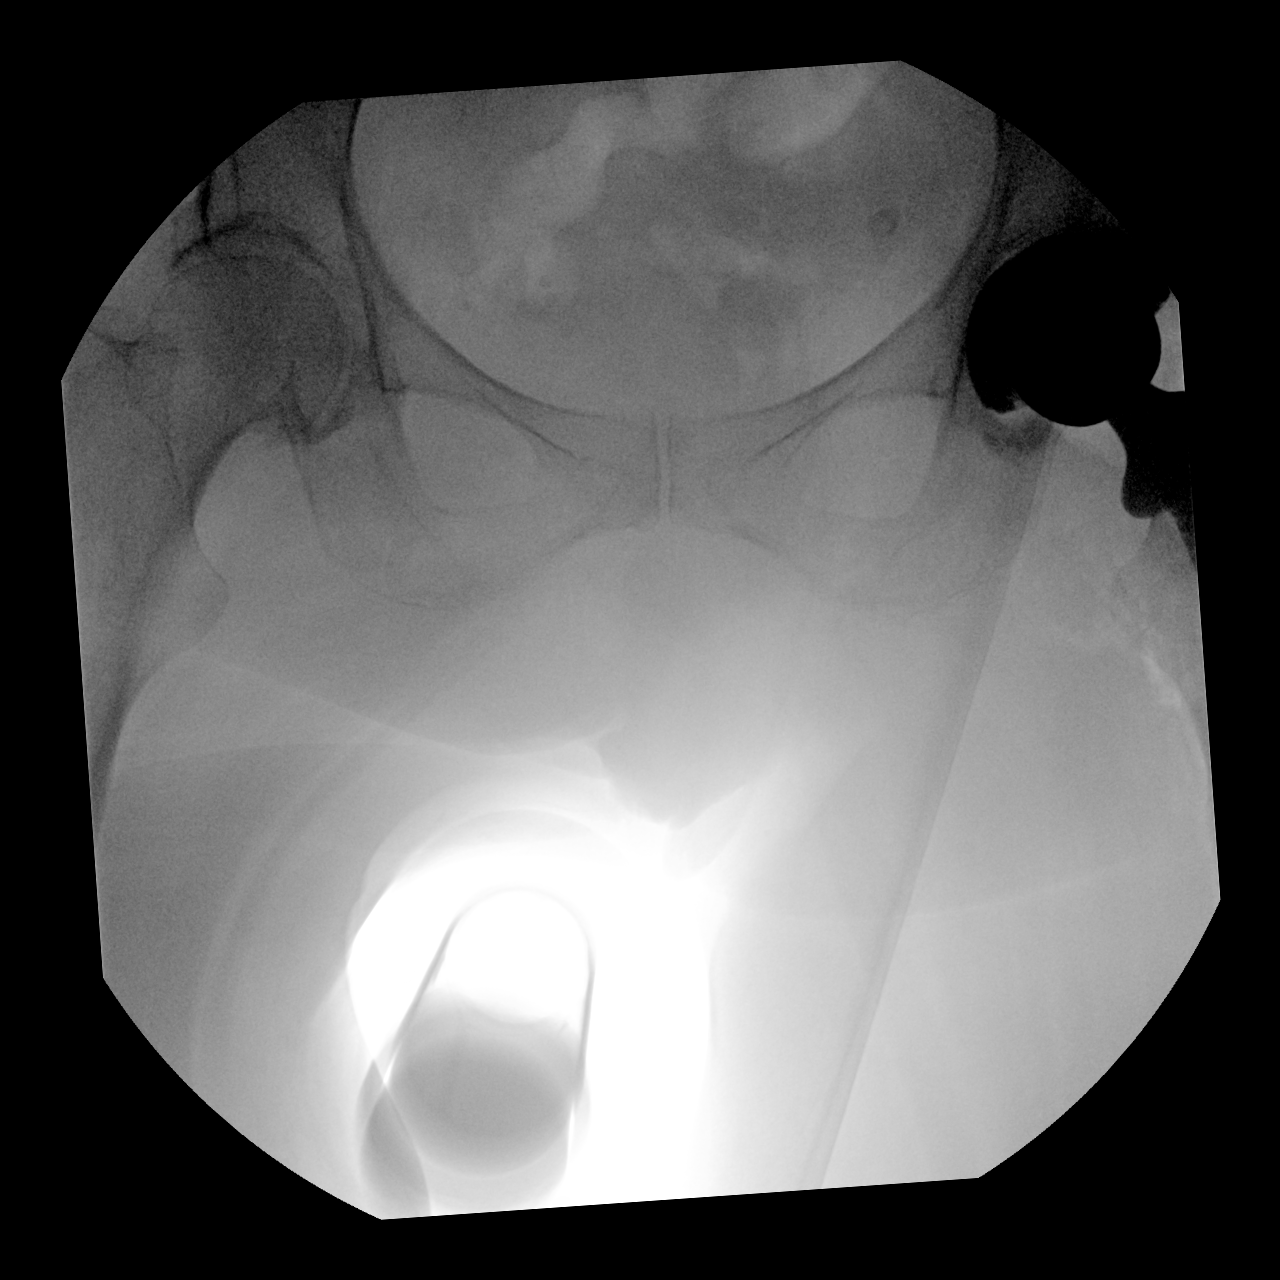

[3 of 3 positions shown; findings below may reference images not displayed]

FINDINGS: Three intraoperative fluoroscopic images were obtained of the left
hip. The left acetabular and femoral components are well situated.
IMPRESSION: Fluoroscopic guidance provided during left total hip arthroplasty.

## 2021-09-01 ENCOUNTER — Telehealth: Payer: Self-pay | Admitting: Registered Nurse

## 2021-09-01 NOTE — Telephone Encounter (Signed)
Left message for patient to call back and schedule Medicare Annual Wellness Visit (AWV).  ? ?Please offer to do virtually or by telephone.  Left office number and my jabber [phone]. ? ?Last AWV:12/12/2019 ? ?Please schedule at anytime with Nurse Health Advisor. ?  ?

## 2021-09-09 ENCOUNTER — Other Ambulatory Visit: Payer: Self-pay | Admitting: Family Medicine

## 2021-09-09 DIAGNOSIS — R1030 Lower abdominal pain, unspecified: Secondary | ICD-10-CM

## 2021-09-09 DIAGNOSIS — R102 Pelvic and perineal pain: Secondary | ICD-10-CM

## 2021-09-10 ENCOUNTER — Other Ambulatory Visit (HOSPITAL_BASED_OUTPATIENT_CLINIC_OR_DEPARTMENT_OTHER): Payer: Self-pay | Admitting: Family Medicine

## 2021-09-10 DIAGNOSIS — R1030 Lower abdominal pain, unspecified: Secondary | ICD-10-CM

## 2021-09-10 DIAGNOSIS — R102 Pelvic and perineal pain: Secondary | ICD-10-CM

## 2021-09-13 ENCOUNTER — Ambulatory Visit (HOSPITAL_BASED_OUTPATIENT_CLINIC_OR_DEPARTMENT_OTHER)
Admission: RE | Admit: 2021-09-13 | Discharge: 2021-09-13 | Disposition: A | Discharge status: Home / Self Care | Payer: Medicare Other | Source: Ambulatory Visit | Attending: Family Medicine | Admitting: Family Medicine

## 2021-09-13 DIAGNOSIS — R1030 Lower abdominal pain, unspecified: Secondary | ICD-10-CM | POA: Diagnosis not present

## 2021-09-13 DIAGNOSIS — R102 Pelvic and perineal pain: Secondary | ICD-10-CM

## 2021-11-21 ENCOUNTER — Other Ambulatory Visit (HOSPITAL_COMMUNITY): Payer: Self-pay | Admitting: Family Medicine

## 2021-11-21 ENCOUNTER — Ambulatory Visit (HOSPITAL_COMMUNITY)
Admission: RE | Admit: 2021-11-21 | Discharge: 2021-11-21 | Disposition: A | Discharge status: Home / Self Care | Payer: Medicare Other | Source: Ambulatory Visit | Attending: Family Medicine | Admitting: Family Medicine

## 2021-11-21 DIAGNOSIS — M7989 Other specified soft tissue disorders: Secondary | ICD-10-CM

## 2021-11-21 DIAGNOSIS — M79661 Pain in right lower leg: Secondary | ICD-10-CM

## 2021-12-12 ENCOUNTER — Telehealth: Payer: Self-pay | Admitting: *Deleted

## 2021-12-12 NOTE — Telephone Encounter (Signed)
Attempted 1 year call to patient to discuss her Left THA done by Dr. Ninfa Linden. Requested call back to discuss.

## 2022-01-22 ENCOUNTER — Ambulatory Visit: Payer: Medicare Other | Admitting: Orthopaedic Surgery

## 2022-02-04 ENCOUNTER — Encounter: Payer: Self-pay | Admitting: Orthopaedic Surgery

## 2022-02-04 ENCOUNTER — Ambulatory Visit (INDEPENDENT_AMBULATORY_CARE_PROVIDER_SITE_OTHER): Payer: Medicare Other

## 2022-02-04 ENCOUNTER — Ambulatory Visit (INDEPENDENT_AMBULATORY_CARE_PROVIDER_SITE_OTHER): Payer: Medicare Other | Admitting: Orthopaedic Surgery

## 2022-02-04 DIAGNOSIS — M1611 Unilateral primary osteoarthritis, right hip: Secondary | ICD-10-CM

## 2022-02-04 DIAGNOSIS — M25551 Pain in right hip: Secondary | ICD-10-CM

## 2022-02-04 NOTE — Progress Notes (Signed)
The patient is well-known to me.  We have replaced her left hip in April 2022.  She does ambulate with a rolling walker.  She has been having right hip pain and has now had at least 2 intra-articular steroid injections in her right hip joint under ultrasound guidance by her PCP Dr. Junius Roads.  She said the one in May did not really help much but the one in August just a month ago is really helped quite a bit but she still has pain in the groin.  She is not a diabetic.  She is obese with a high BMI but very active and she has done well with her hip replacement on the left side with no issues.  Her left hip moves smoothly and fluidly no issues at all.  The right hip has good range of motion but significant pain in the groin with internal and external rotation.  X-rays today of the pelvis and right hip show well-seated left total hip arthroplasty.  The right hip does show narrowing of the joint space with flattening of the femoral head superior laterally and a small periarticular osteophytes.  This has worsened since x-rays in October 2022.  At this point I am fine with proceeding with hip replacement surgery when she is ready based on her x-ray findings and clinical exam findings as well as conservative treatment options that are beginning to fail.  She will let us know when she decides to have this scheduled.  All question concerns were answered and addressed.

## 2022-02-23 ENCOUNTER — Encounter: Payer: Self-pay | Admitting: Orthopaedic Surgery

## 2022-03-18 ENCOUNTER — Other Ambulatory Visit: Payer: Self-pay

## 2022-04-08 ENCOUNTER — Other Ambulatory Visit: Payer: Self-pay | Admitting: Physician Assistant

## 2022-04-08 DIAGNOSIS — M1611 Unilateral primary osteoarthritis, right hip: Secondary | ICD-10-CM

## 2022-04-10 NOTE — Patient Instructions (Signed)
SURGICAL WAITING ROOM VISITATION Patients having surgery or a procedure may have no more than 2 support people in the waiting area - these visitors may rotate in the visitor waiting room.   Children under the age of 61 must have an adult with them who is not the patient. If the patient needs to stay at the hospital during part of their recovery, the visitor guidelines for inpatient rooms apply.  PRE-OP VISITATION  Pre-op nurse will coordinate an appropriate time for 1 support person to accompany the patient in pre-op.  This support person may not rotate.  This visitor will be contacted when the time is appropriate for the visitor to come back in the pre-op area.  Please refer to the [facility] website for the visitor guidelines for Inpatients (after your surgery is over and you are in a regular room).  You are not required to quarantine at this time prior to your surgery. However, you must do this: Hand Hygiene often Do NOT share personal items Notify your provider if you are in close contact with someone who has COVID or you develop fever 100.4 or greater, new onset of sneezing, cough, sore throat, shortness of breath or body aches.  If you test positive for Covid or have been in contact with anyone that has tested positive in the last 10 days please notify you surgeon.    Your procedure is scheduled on:  Friday   April 17, 2022  Report to [facility] Main Entrance: [place] entrance where the [employer] is available.   Report to admitting at:   05:15  AM  +++++Call this number if you have any questions or problems the morning of surgery [phone]  Do not eat food after Midnight the night prior to your surgery/procedure.  After Midnight you may have the following liquids until 04:15 AM DAY OF SURGERY  Clear Liquid Diet Water Black Coffee (sugar ok, NO MILK/CREAM OR CREAMERS)  Tea (sugar ok, NO MILK/CREAM OR CREAMERS) regular and decaf                              Plain Jell-O  with no fruit (NO RED)                                           Fruit ices (not with fruit pulp, NO RED)                                     Popsicles (NO RED)                                                                  Juice: apple, WHITE grape, WHITE cranberry Sports drinks like Gatorade or Powerade (NO RED)                    The day of surgery:  Drink ONE (1) Pre-Surgery Clear Ensure at 04:15 AM the morning of surgery. Drink in one sitting. Do not sip.  This drink was given  to you during your hospital pre-op appointment visit. Nothing else to drink after completing the Pre-Surgery Clear Ensure , No candy, chewing gum or throat lozenges.    FOLLOW  ANY ADDITIONAL PRE OP INSTRUCTIONS YOU RECEIVED FROM YOUR SURGEON'S OFFICE!!!   Oral Hygiene is also important to reduce your risk of infection.        Remember - BRUSH YOUR TEETH THE MORNING OF SURGERY WITH YOUR REGULAR TOOTHPASTE   Take ONLY these medicines the morning of surgery with A SIP OF WATER:  none                    You may not have any metal on your body including hair pins, jewelry, and body piercing  Do not wear make-up, lotions, powders, perfumes or deodorant  Do not wear nail polish including gel and S&S, artificial / acrylic nails, or any other type of covering on natural nails including finger and toenails. If you have artificial nails, gel coating, etc., that needs to be removed by a nail salon, Please have this removed prior to surgery. Not doing so may mean that your surgery could be cancelled or delayed if the Surgeon or anesthesia staff feels like they are unable to monitor you safely.   Do not shave 48 hours prior to surgery to avoid nicks in your skin which may contribute to postoperative infections.   Contacts, Hearing Aids, dentures or bridgework may not be worn into surgery.   You may bring a small overnight bag with you on the day of surgery, only pack items that are not valuable  .[name] IS NOT RESPONSIBLE   FOR VALUABLES THAT ARE LOST OR STOLEN.   Do not bring your home medications to the hospital. The Pharmacy will dispense medications listed on your medication list to you during your admission in the Hospital.  Special Instructions: Bring a copy of your healthcare power of attorney and living will documents the day of surgery, if you wish to have them scanned into your [facility] Records- EPIC  Please read over the following fact sheets you were given: IF YOU HAVE QUESTIONS ABOUT YOUR PRE-OP INSTRUCTIONS, PLEASE CALL [phone]  ([place])   [place] - Preparing for Surgery Before surgery, you can play an important role.  Because skin is not sterile, your skin needs to be as free of germs as possible.  You can reduce the number of germs on your skin by washing with CHG (chlorahexidine gluconate) soap before surgery.  CHG is an antiseptic cleaner which kills germs and bonds with the skin to continue killing germs even after washing. Please DO NOT use if you have an allergy to CHG or antibacterial soaps.  If your skin becomes reddened/irritated stop using the CHG and inform your nurse when you arrive at Short Stay. Do not shave (including legs and underarms) for at least 48 hours prior to the first CHG shower.  You may shave your face/neck.  Please follow these instructions carefully:  1.  Shower with CHG Soap the night before surgery and the  morning of surgery.  2.  If you choose to wash your hair, wash your hair first as usual with your normal  shampoo.  3.  After you shampoo, rinse your hair and body thoroughly to remove the shampoo.                             4.  Use CHG  as you would any other liquid soap.  You can apply chg directly to the skin and wash.  Gently with a scrungie or clean washcloth.  5.  Apply the CHG Soap to your body ONLY FROM THE NECK DOWN.   Do not use on face/ open                           Wound or open sores. Avoid contact with  eyes, ears mouth and genitals (private parts).                       Wash face,  Genitals (private parts) with your normal soap.             6.  Wash thoroughly, paying special attention to the area where your  surgery  will be performed.  7.  Thoroughly rinse your body with warm water from the neck down.  8.  DO NOT shower/wash with your normal soap after using and rinsing off the CHG Soap.            9.  Pat yourself dry with a clean towel.            10.  Wear clean pajamas.            11.  Place clean sheets on your bed the night of your first shower and do not  sleep with pets.  ON THE DAY OF SURGERY : Do not apply any lotions/deodorants the morning of surgery.  Please wear clean clothes to the hospital/surgery center.    FAILURE TO FOLLOW THESE INSTRUCTIONS MAY RESULT IN THE CANCELLATION OF YOUR SURGERY  PATIENT SIGNATURE_________________________________  NURSE SIGNATURE__________________________________  ________________________________________________________________________        [name]    An incentive spirometer is a tool that can help keep your lungs clear and active. This tool measures how well you are filling your lungs with each breath. Taking long deep breaths may help reverse or decrease the chance of developing breathing (pulmonary) problems (especially infection) following: A long period of time when you are unable to move or be active. BEFORE THE PROCEDURE  If the spirometer includes an indicator to show your best effort, your nurse or respiratory therapist will set it to a desired goal. If possible, sit up straight or lean slightly forward. Try not to slouch. Hold the incentive spirometer in an upright position. INSTRUCTIONS FOR USE  Sit on the edge of your bed if possible, or sit up as far as you can in bed or on a chair. Hold the incentive spirometer in an upright position. Breathe out normally. Place the mouthpiece in your mouth and seal  your lips tightly around it. Breathe in slowly and as deeply as possible, raising the piston or the ball toward the top of the column. Hold your breath for 3-5 seconds or for as long as possible. Allow the piston or ball to fall to the bottom of the column. Remove the mouthpiece from your mouth and breathe out normally. Rest for a few seconds and repeat Steps 1 through 7 at least 10 times every 1-2 hours when you are awake. Take your time and take a few normal breaths between deep breaths. The spirometer may include an indicator to show your best effort. Use the indicator as a goal to work toward during each repetition. After each set of 10 deep breaths, practice coughing to be sure your lungs  are clear. If you have an incision (the cut made at the time of surgery), support your incision when coughing by placing a pillow or rolled up towels firmly against it. Once you are able to get out of bed, walk around indoors and cough well. You may stop using the incentive spirometer when instructed by your caregiver.  RISKS AND COMPLICATIONS Take your time so you do not get dizzy or light-headed. If you are in pain, you may need to take or ask for pain medication before doing incentive spirometry. It is harder to take a deep breath if you are having pain. AFTER USE Rest and breathe slowly and easily. It can be helpful to keep track of a log of your progress. Your caregiver can provide you with a simple table to help with this. If you are using the spirometer at home, follow these instructions: [place] IF:  You are having difficultly using the spirometer. You have trouble using the spirometer as often as instructed. Your pain medication is not giving enough relief while using the spirometer. You develop fever of 100.5 F (38.1 C) or higher.                                                                                                    SEEK IMMEDIATE MEDICAL CARE IF:  You cough up bloody  sputum that had not been present before. You develop fever of 102 F (38.9 C) or greater. You develop worsening pain at or near the incision site. MAKE SURE YOU:  Understand these instructions. Will watch your condition. Will get help right away if you are not doing well or get worse. Document Released: 09/14/2006 Document Revised: 07/27/2011 Document Reviewed: 11/15/2006 [facility] Patient Information 2014 [place], [place].

## 2022-04-10 NOTE — Progress Notes (Signed)
COVID Vaccine received:  _0  No _1  Yes Date of any COVID positive Test in last 90 days:  PCP - Eunice Blase, MD  Cardiologist -   Chest x-ray - none EKG -  04-21-2019   will repeat at PST Stress Test - 2020 Epic ECHO - 2019 Epic Cardiac Cath - none  PCR screen: _2  Ordered & Completed                      _3   No Order but Needs PROFEND                      _4   N/A for this surgery  Surgery Plan:  _5  Ambulatory                            _6  Outpatient in bed                            _7  Admit  Anesthesia:    _8  General  _9  Spinal                           _10   Choice _11   MAC  Pacemaker / ICD device _12  No _13  Yes        Device order form faxed _14  No    _15   Yes      Faxed to:  History of Sleep Apnea? _16  No _17  Yes   CPAP used?- _18  No _19  Yes    Does the patient monitor blood sugar? _20  No _21  Yes  _22  N/A  Blood Thinner / Instructions: none Aspirin Instructions:  ERAS Protocol Ordered: _23  No  _24  Yes PRE-SURGERY _25  ENSURE  _26  G2  _27  No Drink Ordered  Patient is to be NPO after: 04:15 am  Comments:   Activity level: Patient can / can not climb a flight of stairs without difficulty; _28  No CP  _29  No SOB, but would have ______   Patient can / can not perform ADLs without assistance.   Anesthesia review: Palps, Mild AS, murmur, fibromyalgia, "unable to urinate after her rotator cuff surgery"  Patient denies shortness of breath, fever, cough and chest pain at PAT appointment.  Patient verbalized understanding and agreement to the Pre-Surgical Instructions that were given to them at this PAT appointment. Patient was also educated of the need to review these PAT instructions again prior to his/her surgery.I reviewed the appropriate phone numbers to call if they have any and questions or concerns.

## 2022-04-13 ENCOUNTER — Encounter (HOSPITAL_COMMUNITY)
Admission: RE | Admit: 2022-04-13 | Discharge: 2022-04-13 | Disposition: A | Discharge status: Home / Self Care | Payer: Medicare Other | Source: Ambulatory Visit | Attending: Orthopaedic Surgery | Admitting: Orthopaedic Surgery

## 2022-04-13 ENCOUNTER — Telehealth: Payer: Self-pay

## 2022-04-13 ENCOUNTER — Encounter (HOSPITAL_COMMUNITY): Payer: Self-pay

## 2022-04-13 DIAGNOSIS — I10 Essential (primary) hypertension: Secondary | ICD-10-CM

## 2022-04-13 DIAGNOSIS — I2510 Atherosclerotic heart disease of native coronary artery without angina pectoris: Secondary | ICD-10-CM

## 2022-04-13 DIAGNOSIS — Z01818 Encounter for other preprocedural examination: Secondary | ICD-10-CM

## 2022-04-13 NOTE — Telephone Encounter (Signed)
FYI - Patient has history of unstable ribs and had an injury last week.  She is postponing her THA on 04-17-22 for 6 weeks because this is the usual time it takes for healing.

## 2022-04-30 ENCOUNTER — Encounter: Payer: Medicare Other | Admitting: Orthopaedic Surgery

## 2022-05-21 ENCOUNTER — Other Ambulatory Visit: Payer: Self-pay | Admitting: Family Medicine

## 2022-05-21 ENCOUNTER — Ambulatory Visit
Admission: RE | Admit: 2022-05-21 | Discharge: 2022-05-21 | Disposition: A | Discharge status: Home / Self Care | Payer: Medicare Other | Source: Ambulatory Visit | Attending: Family Medicine | Admitting: Family Medicine

## 2022-05-21 ENCOUNTER — Encounter (HOSPITAL_COMMUNITY): Admission: RE | Admit: 2022-05-21 | Payer: Medicare Other | Source: Ambulatory Visit

## 2022-05-21 DIAGNOSIS — M25512 Pain in left shoulder: Secondary | ICD-10-CM

## 2022-06-11 ENCOUNTER — Encounter: Payer: Medicare Other | Admitting: Orthopaedic Surgery

## 2022-07-23 ENCOUNTER — Encounter: Payer: Self-pay | Admitting: Radiology

## 2022-08-21 ENCOUNTER — Ambulatory Visit (HOSPITAL_COMMUNITY): Admission: RE | Admit: 2022-08-21 | Payer: Medicare Other | Source: Ambulatory Visit | Admitting: Orthopaedic Surgery

## 2022-08-21 ENCOUNTER — Encounter (HOSPITAL_COMMUNITY): Admission: RE | Payer: Self-pay | Source: Ambulatory Visit

## 2022-08-21 SURGERY — ARTHROPLASTY, HIP, TOTAL, ANTERIOR APPROACH
Anesthesia: Spinal | Site: Hip | Laterality: Right

## 2022-09-03 ENCOUNTER — Encounter: Payer: Medicare Other | Admitting: Orthopaedic Surgery

## 2022-09-23 ENCOUNTER — Other Ambulatory Visit: Payer: Self-pay

## 2022-10-01 ENCOUNTER — Encounter (HOSPITAL_COMMUNITY): Admission: RE | Admit: 2022-10-01 | Payer: Medicare Other | Source: Ambulatory Visit

## 2022-10-08 ENCOUNTER — Ambulatory Visit: Admit: 2022-10-08 | Payer: Medicare Other | Admitting: Orthopaedic Surgery

## 2022-10-08 DIAGNOSIS — M1611 Unilateral primary osteoarthritis, right hip: Secondary | ICD-10-CM

## 2022-10-08 SURGERY — ARTHROPLASTY, HIP, TOTAL, ANTERIOR APPROACH
Anesthesia: Spinal | Site: Hip | Laterality: Right

## 2022-10-22 ENCOUNTER — Encounter: Payer: Medicare Other | Admitting: Orthopaedic Surgery

## 2023-08-01 LAB — EXTERNAL GENERIC LAB PROCEDURE: COLOGUARD: NEGATIVE

## 2023-08-01 LAB — COLOGUARD: COLOGUARD: NEGATIVE

## 2023-08-18 ENCOUNTER — Telehealth: Payer: Self-pay | Admitting: Orthopaedic Surgery

## 2023-08-18 NOTE — Telephone Encounter (Signed)
 Patient scheduled for appt with Magnus Ivan to discuss THA

## 2023-08-18 NOTE — Telephone Encounter (Signed)
 Pt states she was told by her pcp that Dr Magnus Ivan wanted her to call Morrie Sheldon B regarding her hip

## 2023-09-08 ENCOUNTER — Encounter: Payer: Self-pay | Admitting: Orthopaedic Surgery

## 2023-09-08 ENCOUNTER — Ambulatory Visit (INDEPENDENT_AMBULATORY_CARE_PROVIDER_SITE_OTHER): Admitting: Orthopaedic Surgery

## 2023-09-08 ENCOUNTER — Other Ambulatory Visit: Payer: Self-pay

## 2023-09-08 VITALS — Ht 66.5 in | Wt 258.0 lb

## 2023-09-08 DIAGNOSIS — M25551 Pain in right hip: Secondary | ICD-10-CM | POA: Diagnosis not present

## 2023-09-08 DIAGNOSIS — M1611 Unilateral primary osteoarthritis, right hip: Secondary | ICD-10-CM

## 2023-09-08 NOTE — Progress Notes (Signed)
 The patient is well-known to me.  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  We then had her set up for a right total hip arthroplasty but she had other medical issues and some rib fractures and held off on surgery appropriately.  She comes in today to consider to be evaluated again for a right total hip replacement.  She does report right groin pain.  Recently she has been having a lot of right shoulder pain and bruising with no known injury.  Examination of her right shoulder shows the think she has got a proximal biceps tendon rupture.  There is significant bruising but the shoulder otherwise functions well.  What she describes is more consistent with a rupture of the biceps tendon.  I do not feel like this is a rotator cuff issue.  Examination of her right hip shows it moves smoothly and fluidly no blocks or rotation but there is pain in the groin.  Previous x-rays of her right hip show joint space narrowing but not severe.  At this point a MRI of her right hip is really warranted to assess the cartilage around the hip.  We also need to have her work on weight loss.  Today's BMI is elevated at 41.02 and we need her weight to be lower before proceeding with surgery.  She did not have any soft tissue issues on her left hip but her sister did who was also obese when I performed hip replacement surgery on her.  It was just superficial but there was issues.  We will set up an MRI of her right hip and then see her back in follow-up once we have that MRI and then can go from there.  Anytime we do see her she needs a new weight and BMI calculation.  Of note she has already had intra-articular steroid injections in the right hip as well.

## 2023-09-09 ENCOUNTER — Other Ambulatory Visit: Payer: Self-pay | Admitting: Family Medicine

## 2023-09-09 DIAGNOSIS — S46111A Strain of muscle, fascia and tendon of long head of biceps, right arm, initial encounter: Secondary | ICD-10-CM

## 2023-09-09 DIAGNOSIS — G8929 Other chronic pain: Secondary | ICD-10-CM

## 2023-09-24 ENCOUNTER — Ambulatory Visit
Admission: RE | Admit: 2023-09-24 | Discharge: 2023-09-24 | Disposition: A | Discharge status: Home / Self Care | Source: Ambulatory Visit | Attending: Orthopaedic Surgery | Admitting: Orthopaedic Surgery

## 2023-09-24 ENCOUNTER — Ambulatory Visit
Admission: RE | Admit: 2023-09-24 | Discharge: 2023-09-24 | Disposition: A | Discharge status: Home / Self Care | Source: Ambulatory Visit | Attending: Family Medicine | Admitting: Family Medicine

## 2023-09-24 DIAGNOSIS — M25551 Pain in right hip: Secondary | ICD-10-CM

## 2023-09-24 DIAGNOSIS — M1611 Unilateral primary osteoarthritis, right hip: Secondary | ICD-10-CM

## 2023-09-24 DIAGNOSIS — G8929 Other chronic pain: Secondary | ICD-10-CM

## 2023-09-24 DIAGNOSIS — S46111A Strain of muscle, fascia and tendon of long head of biceps, right arm, initial encounter: Secondary | ICD-10-CM

## 2023-10-25 ENCOUNTER — Ambulatory Visit (INDEPENDENT_AMBULATORY_CARE_PROVIDER_SITE_OTHER): Admitting: Orthopaedic Surgery

## 2023-10-25 ENCOUNTER — Encounter: Payer: Self-pay | Admitting: Orthopaedic Surgery

## 2023-10-25 VITALS — Wt 250.0 lb

## 2023-10-25 DIAGNOSIS — M25551 Pain in right hip: Secondary | ICD-10-CM

## 2023-10-25 DIAGNOSIS — M1611 Unilateral primary osteoarthritis, right hip: Secondary | ICD-10-CM

## 2023-10-25 NOTE — Progress Notes (Signed)
 The patient is very well-known to us .  We replaced her left hip successfully through direct and her approach back in April 2022.  She comes in today to go over MRI of her right hip.  We wanted to obtain this MRI because of the pain that she was having in her right hip and her groin.  She had tried and failed conservative treatment for that hip.  She continues on the weight loss journey and continues to lose weight.  Her BMI today is 39.75.  Her right hip does hurt in the groin with internal and external rotation.  The MRI of the right hip does confirm significant arthritis of the right hip with cartilage loss of the weightbearing surface of the femoral head and acetabulum.  There is edema in the bone and osteophytes around the right hip.  She does wish to consider a right hip replacement.  She would like to wait potentially until a lot of construction is being done inside her house is finished.  She is thinking maybe later in August or so.  With that being said I did give her our surgery scheduler's card to give us  a call when she would like to have her right hip replacement scheduled.  She has tried and failed all forms conservative treatment.  Having had a hip replaced before on her left side she is fully aware what the surgery involves as well as the risks and benefits.

## 2023-12-23 ENCOUNTER — Telehealth: Payer: Self-pay | Admitting: Orthopaedic Surgery

## 2023-12-23 NOTE — Telephone Encounter (Signed)
 Patient would like to move forward with right total hip arthroplasty.  She does have a preference for [facility]ryle Law and having the surgery on Friday.  She is hoping for September 12th if possible.  Please call patient [phone].

## 2023-12-28 NOTE — Telephone Encounter (Signed)
I called patient and left voice mail for return call. °

## 2023-12-30 NOTE — Telephone Encounter (Signed)
 Spoke with patient and scheduled surgery.

## 2024-01-11 ENCOUNTER — Other Ambulatory Visit: Payer: Self-pay | Admitting: Physician Assistant

## 2024-01-11 DIAGNOSIS — Z01818 Encounter for other preprocedural examination: Secondary | ICD-10-CM

## 2024-01-18 ENCOUNTER — Encounter: Payer: Self-pay | Admitting: Orthopaedic Surgery

## 2024-01-20 NOTE — Patient Instructions (Signed)
 SURGICAL WAITING ROOM VISITATION  Patients having surgery or a procedure may have no more than 2 support people in the waiting area - these visitors may rotate.    Children under the age of 53 must have an adult with them who is not the patient.  Visitors with respiratory illnesses are discouraged from visiting and should remain at home.  If the patient needs to stay at the hospital during part of their recovery, the visitor guidelines for inpatient rooms apply. Pre-op nurse will coordinate an appropriate time for 1 support person to accompany patient in pre-op.  This support person may not rotate.    Please refer to the [facility] website for the visitor guidelines for Inpatients (after your surgery is over and you are in a regular room).       Your procedure is scheduled on: 01-28-24   Report to [facility] Main Entrance    Report to admitting at   0515  AM   Call this number if you have problems the morning of surgery [phone]   Do not eat food :After Midnight.   After Midnight you may have the following liquids until __0415____ AM/ DAY OF SURGERY   then nothing by mouth  Water  Non-Citrus Juices (without pulp, NO RED-Apple, White grape, White cranberry) Black Coffee (NO MILK/CREAM OR CREAMERS, sugar ok)  Clear Tea (NO MILK/CREAM OR CREAMERS, sugar ok) regular and decaf                             Plain Jell-O (NO RED)                                           Fruit ices (not with fruit pulp, NO RED)                                     Popsicles (NO RED)                                                               Sports drinks like Gatorade (NO RED)                     The day of surgery:  Drink ONE (1) Pre-Surgery Clear Ensure BY 0415   AM the morning of surgery. Drink in one sitting. Do not sip.  This drink was given to you during your hospital  pre-op appointment visit. Nothing else to drink after completing the  Pre-Surgery Clear Ensure.          If  you have questions, please contact your surgeon's office.   FOLLOW  ADDITIONAL PRE OP INSTRUCTIONS YOU RECEIVED FROM YOUR SURGEON'S OFFICE!!!     Oral Hygiene is also important to reduce your risk of infection.                                    Remember - BRUSH YOUR TEETH THE MORNING OF SURGERY WITH YOUR REGULAR TOOTHPASTE  DENTURES WILL BE REMOVED PRIOR TO SURGERY PLEASE DO NOT APPLY Poly grip OR ADHESIVES!!!   Do NOT smoke after Midnight   Stop all vitamins and herbal supplements 7 days before surgery.   Take these medicines the morning of surgery with A SIP OF WATER : NONE    Bring CPAP mask and tubing day of surgery.                              You may not have any metal on your body including hair pins, jewelry, and body piercing             Do not wear make-up, lotions, powders, perfumes/cologne, or deodorant  Do not wear nail polish including gel and S&S, artificial/acrylic nails, or any other type of covering on natural nails including finger and toenails. If you have artificial nails, gel coating, etc. that needs to be removed by a nail salon please have this removed prior to surgery or surgery may need to be canceled/ delayed if the surgeon/ anesthesia feels like they are unable to be safely monitored.   Do not shave 5 days prior to surgery.               Do not bring valuables to the hospital. [facility] IS NOT             RESPONSIBLE   FOR VALUABLES.   Contacts, glasses, dentures or bridgework may not be worn into surgery.   Bring small overnight bag day of surgery.   DO NOT BRING YOUR HOME MEDICATIONS TO THE HOSPITAL. PHARMACY WILL DISPENSE MEDICATIONS LISTED ON YOUR MEDICATION LIST TO YOU DURING YOUR ADMISSION IN THE HOSPITAL!    Patients discharged on the day of surgery will not be allowed to drive home.  Someone NEEDS to stay with you for the first 24 hours after anesthesia.   Special Instructions: Bring a copy of your healthcare power of attorney and  living will documents the day of surgery if you haven't scanned them before.              Please read over the following fact sheets you were given: IF YOU HAVE QUESTIONS ABOUT YOUR PRE-OP INSTRUCTIONS PLEASE CALL [phone].    If you test positive for Covid or have been in contact with anyone that has tested positive in the last 10 days please notify you surgeon.      Pre-operative 5 CHG Bath Instructions   You can play a key role in reducing the risk of infection after surgery. Your skin needs to be as free of germs as possible. You can reduce the number of germs on your skin by washing with CHG (chlorhexidine  gluconate) soap before surgery. CHG is an antiseptic soap that kills germs and continues to kill germs even after washing.   DO NOT use if you have an allergy to chlorhexidine /CHG or antibacterial soaps. If your skin becomes reddened or irritated, stop using the CHG and notify one of our RNs at [phone].   Please shower with the CHG soap starting 4 days before surgery using the following schedule:     Please keep in mind the following:  DO NOT shave, including legs and underarms, starting the day of your first shower.   You may shave your face at any point before/day of surgery.  Place clean sheets on your bed the day you start using CHG soap. Use a clean washcloth (not  used since being washed) for each shower. DO NOT sleep with pets once you start using the CHG.   CHG Shower Instructions:  If you choose to wash your hair and private area, wash first with your normal shampoo/soap.  After you use shampoo/soap, rinse your hair and body thoroughly to remove shampoo/soap residue.  Turn the water  OFF and apply about 3 tablespoons (45 ml) of CHG soap to a CLEAN washcloth.  Apply CHG soap ONLY FROM YOUR NECK DOWN TO YOUR TOES (washing for 3-5 minutes)  DO NOT use CHG soap on face, private areas, open wounds, or sores.  Pay special attention to the area where your surgery is being  performed.  If you are having back surgery, having someone wash your back for you may be helpful. Wait 2 minutes after CHG soap is applied, then you may rinse off the CHG soap.  Pat dry with a clean towel  Put on clean clothes/pajamas   If you choose to wear lotion, please use ONLY the CHG-compatible lotions on the back of this paper.     Additional instructions for the day of surgery: DO NOT APPLY any lotions, deodorants, cologne, or perfumes.   Put on clean/comfortable clothes.  Brush your teeth.  Ask your nurse before applying any prescription medications to the skin.      CHG Compatible Lotions   Aveeno Moisturizing lotion  Cetaphil Moisturizing Cream  Cetaphil Moisturizing Lotion  Clairol Herbal Essence Moisturizing Lotion, Dry Skin  Clairol Herbal Essence Moisturizing Lotion, Extra Dry Skin  Clairol Herbal Essence Moisturizing Lotion, Normal Skin  Curel Age Defying Therapeutic Moisturizing Lotion with Alpha Hydroxy  Curel Extreme Care Body Lotion  Curel Soothing Hands Moisturizing Hand Lotion  Curel Therapeutic Moisturizing Cream, Fragrance-Free  Curel Therapeutic Moisturizing Lotion, Fragrance-Free  Curel Therapeutic Moisturizing Lotion, Original Formula  Eucerin Daily Replenishing Lotion  Eucerin Dry Skin Therapy Plus Alpha Hydroxy Crme  Eucerin Dry Skin Therapy Plus Alpha Hydroxy Lotion  Eucerin Original Crme  Eucerin Original Lotion  Eucerin Plus Crme Eucerin Plus Lotion  Eucerin TriLipid Replenishing Lotion  Keri Anti-Bacterial Hand Lotion  Keri Deep Conditioning Original Lotion Dry Skin Formula Softly Scented  Keri Deep Conditioning Original Lotion, Fragrance Free Sensitive Skin Formula  Keri Lotion Fast Absorbing Fragrance Free Sensitive Skin Formula  Keri Lotion Fast Absorbing Softly Scented Dry Skin Formula  Keri Original Lotion  Keri Skin Renewal Lotion Keri Silky Smooth Lotion  Keri Silky Smooth Sensitive Skin Lotion  Nivea Body Creamy Conditioning  Oil  Nivea Body Extra Enriched Teacher, adult education Moisturizing Lotion Nivea Crme  Nivea Skin Firming Lotion  NutraDerm 30 Skin Lotion  NutraDerm Skin Lotion  NutraDerm Therapeutic Skin Cream  NutraDerm Therapeutic Skin Lotion  ProShield Protective Hand Cream  WHAT IS A BLOOD TRANSFUSION? Blood Transfusion Information  A transfusion is the replacement of blood or some of its parts. Blood is made up of multiple cells which provide different functions. Red blood cells carry oxygen and are used for blood loss replacement. White blood cells fight against infection. Platelets control bleeding. Plasma helps clot blood. Other blood products are available for specialized needs, such as hemophilia or other clotting disorders. BEFORE THE TRANSFUSION  Who gives blood for transfusions?  Healthy volunteers who are fully evaluated to make sure their blood is safe. This is blood bank blood. Transfusion therapy is the safest it has ever been in the practice of medicine. Before blood is taken from a donor,  a complete history is taken to make sure that person has no history of diseases nor engages in risky social behavior (examples are intravenous drug use or sexual activity with multiple partners). The donor's travel history is screened to minimize risk of transmitting infections, such as malaria. The donated blood is tested for signs of infectious diseases, such as HIV and hepatitis. The blood is then tested to be sure it is compatible with you in order to minimize the chance of a transfusion reaction. If you or a relative donates blood, this is often done in anticipation of surgery and is not appropriate for emergency situations. It takes many days to process the donated blood. RISKS AND COMPLICATIONS Although transfusion therapy is very safe and saves many lives, the main dangers of transfusion include:  Getting an infectious disease. Developing a transfusion reaction.  This is an allergic reaction to something in the blood you were given. Every precaution is taken to prevent this. The decision to have a blood transfusion has been considered carefully by your caregiver before blood is given. Blood is not given unless the benefits outweigh the risks. AFTER THE TRANSFUSION Right after receiving a blood transfusion, you will usually feel much better and more energetic. This is especially true if your red blood cells have gotten low (anemic). The transfusion raises the level of the red blood cells which carry oxygen, and this usually causes an energy increase. The nurse administering the transfusion will monitor you carefully for complications. HOME CARE INSTRUCTIONS  No special instructions are needed after a transfusion. You may find your energy is better. Speak with your caregiver about any limitations on activity for underlying diseases you may have. SEEK MEDICAL CARE IF:  Your condition is not improving after your transfusion. You develop redness or irritation at the intravenous (IV) site. SEEK IMMEDIATE MEDICAL CARE IF:  Any of the following symptoms occur over the next 12 hours: Shaking chills. You have a temperature by mouth above 102 F (38.9 C), not controlled by medicine. Chest, back, or muscle pain. People around you feel you are not acting correctly or are confused. Shortness of breath or difficulty breathing. Dizziness and fainting. You get a rash or develop hives. You have a decrease in urine output. Your urine turns a dark color or changes to pink, red, or brown. Any of the following symptoms occur over the next 10 days: You have a temperature by mouth above 102 F (38.9 C), not controlled by medicine. Shortness of breath. Weakness after normal activity. The white part of the eye turns yellow (jaundice). You have a decrease in the amount of urine or are urinating less often. Your urine turns a dark color or changes to pink, red, or  brown. Document Released: 05/01/2000 Document Revised: 07/27/2011 Document Reviewed: 12/19/2007 ExitCare Patient Information 2014 [place], [place].  _______________________________________________________________________  Incentive Spirometer  An incentive spirometer is a tool that can help keep your lungs clear and active. This tool measures how well you are filling your lungs with each breath. Taking long deep breaths may help reverse or decrease the chance of developing breathing (pulmonary) problems (especially infection) following: A long period of time when you are unable to move or be active. BEFORE THE PROCEDURE  If the spirometer includes an indicator to show your best effort, your nurse or respiratory therapist will set it to a desired goal. If possible, sit up straight or lean slightly forward. Try not to slouch. Hold the incentive spirometer in an upright position. INSTRUCTIONS  FOR USE  Sit on the edge of your bed if possible, or sit up as far as you can in bed or on a chair. Hold the incentive spirometer in an upright position. Breathe out normally. Place the mouthpiece in your mouth and seal your lips tightly around it. Breathe in slowly and as deeply as possible, raising the piston or the ball toward the top of the column. Hold your breath for 3-5 seconds or for as long as possible. Allow the piston or ball to fall to the bottom of the column. Remove the mouthpiece from your mouth and breathe out normally. Rest for a few seconds and repeat Steps 1 through 7 at least 10 times every 1-2 hours when you are awake. Take your time and take a few normal breaths between deep breaths. The spirometer may include an indicator to show your best effort. Use the indicator as a goal to work toward during each repetition. After each set of 10 deep breaths, practice coughing to be sure your lungs are clear. If you have an incision (the cut made at the time of surgery), support your incision when  coughing by placing a pillow or rolled up towels firmly against it. Once you are able to get out of bed, walk around indoors and cough well. You may stop using the incentive spirometer when instructed by your caregiver.  RISKS AND COMPLICATIONS Take your time so you do not get dizzy or light-headed. If you are in pain, you may need to take or ask for pain medication before doing incentive spirometry. It is harder to take a deep breath if you are having pain. AFTER USE Rest and breathe slowly and easily. It can be helpful to keep track of a log of your progress. Your caregiver can provide you with a simple table to help with this. If you are using the spirometer at home, follow these instructions: SEEK MEDICAL CARE IF:  You are having difficultly using the spirometer. You have trouble using the spirometer as often as instructed. Your pain medication is not giving enough relief while using the spirometer. You develop fever of 100.5 F (38.1 C) or higher. SEEK IMMEDIATE MEDICAL CARE IF:  You cough up bloody sputum that had not been present before. You develop fever of 102 F (38.9 C) or greater. You develop worsening pain at or near the incision site. MAKE SURE YOU:  Understand these instructions. Will watch your condition. Will get help right away if you are not doing well or get worse. Document Released: 09/14/2006 Document Revised: 07/27/2011 Document Reviewed: 11/15/2006 [facility] Patient Information 2014 [place], [place].   ________________________________________________________________________

## 2024-01-20 NOTE — Progress Notes (Signed)
 PCP - Dr.    Ozell Dopp Cardiologist - Sharmon Canterbury PA-C 04-21-19 epic   PPM/ICD -  Device Orders -  Rep Notified -   Chest x-ray -  EKG - preop 01-21-24 Stress Test - 2019 epic ECHO - 04-25-2019  epic Cardiac Cath -   Sleep Study -  CPAP -   Fasting Blood Sugar -  Checks Blood Sugar _n/a____ times a day  Blood Thinner Instructions:n/a Aspirin  Instructions:n/a  ERAS Protcol - PRE-SURGERY Ensure    COVID vaccine -no  Activity--Able to walk with walker with no CP or SOB  Anesthesia review: Murmur,mild aortic valve stenosis per echo 2020  Patient denies shortness of breath, fever, cough and chest pain at PAT appointment   All instructions explained to the patient, with a verbal understanding of the material. Patient agrees to go over the instructions while at home for a better understanding. Patient also instructed to self quarantine after being tested for COVID-19. The opportunity to ask questions was provided.

## 2024-01-21 ENCOUNTER — Other Ambulatory Visit: Payer: Self-pay

## 2024-01-21 ENCOUNTER — Encounter (HOSPITAL_COMMUNITY)
Admission: RE | Admit: 2024-01-21 | Discharge: 2024-01-21 | Disposition: A | Discharge status: Home / Self Care | Source: Ambulatory Visit | Attending: Orthopaedic Surgery | Admitting: Orthopaedic Surgery

## 2024-01-21 ENCOUNTER — Encounter (HOSPITAL_COMMUNITY): Payer: Self-pay

## 2024-01-21 VITALS — BP 154/93 | HR 60 | Temp 98.3°F | Resp 16 | Ht 66.5 in | Wt 247.0 lb

## 2024-01-21 DIAGNOSIS — Z01818 Encounter for other preprocedural examination: Secondary | ICD-10-CM | POA: Diagnosis not present

## 2024-01-21 DIAGNOSIS — I350 Nonrheumatic aortic (valve) stenosis: Secondary | ICD-10-CM | POA: Insufficient documentation

## 2024-01-21 HISTORY — DX: Nonrheumatic aortic (valve) stenosis: I35.0

## 2024-01-21 HISTORY — DX: Atherosclerotic heart disease of native coronary artery without angina pectoris: I25.10

## 2024-01-21 HISTORY — DX: Personal history of other diseases of the digestive system: Z87.19

## 2024-01-21 LAB — BASIC METABOLIC PANEL WITH GFR
Anion gap: 10 (ref 5–15)
BUN: 9 mg/dL (ref 8–23)
CO2: 25 mmol/L (ref 22–32)
Calcium: 9.6 mg/dL (ref 8.9–10.3)
Chloride: 103 mmol/L (ref 98–111)
Creatinine, Ser: 0.56 mg/dL (ref 0.44–1.00)
GFR, Estimated: >60 mL/min (ref >60)
Glucose, Bld: 92 mg/dL (ref 70–99)
Potassium: 4.7 mmol/L (ref 3.5–5.1)
Sodium: 139 mmol/L (ref 135–145)

## 2024-01-21 LAB — CBC
HCT: 46.8 % — ABNORMAL HIGH (ref 36.0–46.0)
Hemoglobin: 15.2 g/dL — ABNORMAL HIGH (ref 12.0–15.0)
MCH: 29.9 pg (ref 26.0–34.0)
MCHC: 32.5 g/dL (ref 30.0–36.0)
MCV: 91.9 fL (ref 80.0–100.0)
Platelets: 203 K/uL (ref 150–400)
RBC: 5.09 MIL/uL (ref 3.87–5.11)
RDW: 12.6 % (ref 11.5–15.5)
WBC: 5 K/uL (ref 4.0–10.5)
nRBC: 0 % (ref 0.0–0.2)

## 2024-01-21 LAB — SURGICAL PCR SCREEN
MRSA, PCR: NEGATIVE
Staphylococcus aureus: NEGATIVE

## 2024-01-24 ENCOUNTER — Telehealth: Payer: Self-pay

## 2024-01-24 NOTE — Telephone Encounter (Signed)
 Received call from [place] at [facility] Anesthesia stating that patient needs another ECHO.  Last one was done in 2020 which showed aortic stenosis.  Anesthesia wants them repeated at least every five years to check if stable.  She would only need cardiac clearance if ECHO shows worsening stenosis   I called Dr. Hughie and advised.  He will try to order and contact patient.

## 2024-01-25 ENCOUNTER — Telehealth: Payer: Self-pay

## 2024-01-25 DIAGNOSIS — I350 Nonrheumatic aortic (valve) stenosis: Secondary | ICD-10-CM

## 2024-01-25 NOTE — Telephone Encounter (Signed)
 Spoke with pt regarding scheduling her echocardiogram. Pt is scheduled on Thurs, 9/11 at 2pm. Pt is aware of appointment. We will follow up with her after her surgery to schedule office visit with Dr. Pietro.

## 2024-01-25 NOTE — Telephone Encounter (Signed)
 Dr. Pietro received an email from Dr. Ozell Dopp stating that pt is having hip surgery on Friday (9/12) and needs echocardiogram prior to surgery.  Left detailed message for pt to call back to further discuss.

## 2024-01-25 NOTE — Telephone Encounter (Signed)
 Spoke with pt regarding needing echocardiogram, she is willing to be flexible to get echo done prior to surgery on Friday. Will follow up with pt regarding this appointment.

## 2024-01-25 NOTE — Telephone Encounter (Signed)
 Pt returning call

## 2024-01-27 ENCOUNTER — Ambulatory Visit (HOSPITAL_BASED_OUTPATIENT_CLINIC_OR_DEPARTMENT_OTHER)
Admission: RE | Admit: 2024-01-27 | Discharge: 2024-01-27 | Disposition: A | Discharge status: Home / Self Care | Source: Ambulatory Visit | Attending: Cardiology | Admitting: Cardiology

## 2024-01-27 ENCOUNTER — Encounter (HOSPITAL_COMMUNITY): Payer: Self-pay

## 2024-01-27 DIAGNOSIS — M1611 Unilateral primary osteoarthritis, right hip: Principal | ICD-10-CM | POA: Insufficient documentation

## 2024-01-27 DIAGNOSIS — I350 Nonrheumatic aortic (valve) stenosis: Secondary | ICD-10-CM | POA: Diagnosis present

## 2024-01-27 NOTE — Anesthesia Preprocedure Evaluation (Addendum)
 Anesthesia Evaluation  Patient identified by MRN, date of birth, ID band Patient awake    Reviewed: Allergy & Precautions, H&P , NPO status , Patient's Chart, lab work & pertinent test results  History of Anesthesia Complications (+) history of anesthetic complications  Airway Mallampati: II  TM Distance: >3 FB Neck ROM: Full    Dental no notable dental hx. (+) Teeth Intact, Dental Advisory Given   Pulmonary neg pulmonary ROS   Pulmonary exam normal breath sounds clear to auscultation       Cardiovascular + CAD  negative cardio ROS + Valvular Problems/Murmurs  Rhythm:Regular Rate:Normal + Systolic murmurs    Neuro/Psych  Neuromuscular disease negative neurological ROS  negative psych ROS   GI/Hepatic negative GI ROS, Neg liver ROS, hiatal hernia,,,  Endo/Other  negative endocrine ROS    Renal/GU negative Renal ROS  negative genitourinary   Musculoskeletal negative musculoskeletal ROS (+) Arthritis ,  Fibromyalgia -  Abdominal   Peds negative pediatric ROS (+)  Hematology negative hematology ROS (+)   Anesthesia Other Findings   Reproductive/Obstetrics negative OB ROS                              Anesthesia Physical Anesthesia Plan  ASA: 3  Anesthesia Plan: MAC, Spinal and Regional   Post-op Pain Management: Tylenol  PO (pre-op)*, Celebrex  PO (pre-op)* and Minimal or no pain anticipated   Induction: Intravenous  PONV Risk Score and Plan: 2 and Ondansetron , Dexamethasone  and Propofol  infusion  Airway Management Planned: Simple Face Mask and Natural Airway  Additional Equipment: None  Intra-op Plan:   Post-operative Plan: Extubation in OR  Informed Consent: I have reviewed the patients History and Physical, chart, labs and discussed the procedure including the risks, benefits and alternatives for the proposed anesthesia with the patient or authorized representative who has  indicated his/her understanding and acceptance.     Dental advisory given  Plan Discussed with: CRNA and Anesthesiologist  Anesthesia Plan Comments: (See PAT note from 9/5 DISCUSSION: [name] is a 73 yo female with PMH of mild aortic stenosis, CAD (by CT), hiatal hernia, fibromyalgia, arthritis, obesity (BMI 39)   Patient previously followed with Cardiology for hx of CAD by CT and mild AS. Last echo was in 2020 and showed mild AS with mean gradient of 13.5. Requested updated echo to ensure stability of her aortic stenosis.   Echo scheduled for 9/11 at 2pm. Will follow.  EKG 01/21/24:   Normal sinus rhythm Anterior infarct , age undetermined   CV:   Stress test 01/21/2018:   The left ventricular ejection fraction is normal (55-65%). Nuclear stress EF: 63%. There was no ST segment deviation noted during stress. The study is normal. This is a low risk study.   Normal resting and stress perfusion. No ischemia or infarction EF 63%      )         Anesthesia Quick Evaluation
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
## 2024-01-27 NOTE — Telephone Encounter (Signed)
 Pt called in asking that I send a message about echo and asked if she can still have surgery tomorrow. Informed her results aren't in yet but they will call as soon as they can.

## 2024-01-28 ENCOUNTER — Ambulatory Visit (HOSPITAL_COMMUNITY)

## 2024-01-28 ENCOUNTER — Ambulatory Visit (HOSPITAL_COMMUNITY): Payer: Self-pay | Admitting: Physician Assistant

## 2024-01-28 ENCOUNTER — Ambulatory Visit (HOSPITAL_COMMUNITY): Payer: Self-pay | Admitting: Anesthesiology

## 2024-01-28 ENCOUNTER — Observation Stay (HOSPITAL_COMMUNITY)
Admission: RE | Admit: 2024-01-28 | Discharge: 2024-01-30 | Disposition: A | Discharge status: Home / Self Care | Source: Ambulatory Visit | Attending: Orthopaedic Surgery | Admitting: Orthopaedic Surgery

## 2024-01-28 ENCOUNTER — Other Ambulatory Visit: Payer: Self-pay

## 2024-01-28 ENCOUNTER — Observation Stay (HOSPITAL_COMMUNITY)

## 2024-01-28 ENCOUNTER — Encounter (HOSPITAL_COMMUNITY)
Admission: RE | Disposition: A | Discharge status: Home / Self Care | Payer: Self-pay | Source: Ambulatory Visit | Attending: Orthopaedic Surgery

## 2024-01-28 ENCOUNTER — Encounter (HOSPITAL_COMMUNITY): Payer: Self-pay | Admitting: Orthopaedic Surgery

## 2024-01-28 ENCOUNTER — Ambulatory Visit: Payer: Self-pay | Admitting: Cardiology

## 2024-01-28 DIAGNOSIS — I2510 Atherosclerotic heart disease of native coronary artery without angina pectoris: Secondary | ICD-10-CM

## 2024-01-28 DIAGNOSIS — Z7982 Long term (current) use of aspirin: Secondary | ICD-10-CM | POA: Diagnosis not present

## 2024-01-28 DIAGNOSIS — Z96653 Presence of artificial knee joint, bilateral: Secondary | ICD-10-CM | POA: Insufficient documentation

## 2024-01-28 DIAGNOSIS — I350 Nonrheumatic aortic (valve) stenosis: Secondary | ICD-10-CM | POA: Diagnosis not present

## 2024-01-28 DIAGNOSIS — Z96642 Presence of left artificial hip joint: Secondary | ICD-10-CM | POA: Insufficient documentation

## 2024-01-28 DIAGNOSIS — M25551 Pain in right hip: Secondary | ICD-10-CM | POA: Diagnosis present

## 2024-01-28 DIAGNOSIS — M1611 Unilateral primary osteoarthritis, right hip: Principal | ICD-10-CM | POA: Insufficient documentation

## 2024-01-28 DIAGNOSIS — Z96641 Presence of right artificial hip joint: Secondary | ICD-10-CM

## 2024-01-28 HISTORY — PX: TOTAL HIP ARTHROPLASTY: SHX124

## 2024-01-28 LAB — ECHOCARDIOGRAM COMPLETE
AR max vel: 1.32 cm2
AV Area VTI: 1.42 cm2
AV Area mean vel: 1.35 cm2
AV Mean grad: 25 mmHg
AV Peak grad: 42 mmHg
Ao pk vel: 3.24 m/s
Area-P 1/2: 5.88 cm2
Calc EF: 71.4 %
S' Lateral: 1.7 cm
Single Plane A2C EF: 70.5 %
Single Plane A4C EF: 72.5 %

## 2024-01-28 LAB — TYPE AND SCREEN
ABO/RH(D): O POS
Antibody Screen: NEGATIVE

## 2024-01-28 SURGERY — ARTHROPLASTY, HIP, TOTAL, ANTERIOR APPROACH
Anesthesia: Monitor Anesthesia Care | Site: Hip | Laterality: Right

## 2024-01-28 MED ORDER — TRANEXAMIC ACID-NACL 1000-0.7 MG/100ML-% IV SOLN
1000.0000 mg | INTRAVENOUS | Status: AC
Start: 2024-01-28 — End: 2024-01-28
  Administered 2024-01-28: 1000 mg via INTRAVENOUS
  Filled 2024-01-28: qty 100

## 2024-01-28 MED ORDER — PROPOFOL 500 MG/50ML IV EMUL
INTRAVENOUS | Status: AC
Start: 2024-01-28 — End: 2024-01-28
  Filled 2024-01-28: qty 50

## 2024-01-28 MED ORDER — POVIDONE-IODINE 10 % EX SWAB
2.0000 | Freq: Once | CUTANEOUS | Status: DC
Start: 2024-01-28 — End: 2024-01-28

## 2024-01-28 MED ORDER — ALUM & MAG HYDROXIDE-SIMETH 200-200-20 MG/5ML PO SUSP: 30.0000 mL | ORAL | Status: DC | PRN

## 2024-01-28 MED ORDER — PANTOPRAZOLE SODIUM 40 MG PO TBEC
40.0000 mg | DELAYED_RELEASE_TABLET | Freq: Every day | ORAL | Status: DC
  Administered 2024-01-28 – 2024-01-30 (×3): 40 mg via ORAL
  Filled 2024-01-28 (×4): qty 1

## 2024-01-28 MED ORDER — MEPERIDINE HCL 100 MG/ML IJ SOLN: 6.2500 mg | INTRAMUSCULAR | Status: DC | PRN

## 2024-01-28 MED ORDER — DIPHENHYDRAMINE HCL 12.5 MG/5ML PO ELIX: 12.5000 mg | ORAL_SOLUTION | ORAL | Status: DC | PRN

## 2024-01-28 MED ORDER — ONDANSETRON HCL 4 MG PO TABS
4.0000 mg | ORAL_TABLET | Freq: Four times a day (QID) | ORAL | Status: DC | PRN
  Administered 2024-01-30: 4 mg via ORAL
  Filled 2024-01-28: qty 1

## 2024-01-28 MED ORDER — OXYCODONE HCL 5 MG PO TABS: 10.0000 mg | ORAL_TABLET | ORAL | Status: DC | PRN

## 2024-01-28 MED ORDER — METHOCARBAMOL 1000 MG/10ML IJ SOLN: 500.0000 mg | Freq: Four times a day (QID) | INTRAMUSCULAR | Status: DC | PRN

## 2024-01-28 MED ORDER — SUGAMMADEX SODIUM 200 MG/2ML IV SOLN
INTRAVENOUS | Status: AC
  Filled 2024-01-28: qty 2

## 2024-01-28 MED ORDER — PHENYLEPHRINE HCL-NACL 20-0.9 MG/250ML-% IV SOLN
INTRAVENOUS | Status: AC
  Filled 2024-01-28: qty 250

## 2024-01-28 MED ORDER — SODIUM CHLORIDE 0.9 % IR SOLN
Status: DC | PRN
  Administered 2024-01-28: 1000 mL

## 2024-01-28 MED ORDER — ASPIRIN 81 MG PO CHEW
81.0000 mg | CHEWABLE_TABLET | Freq: Two times a day (BID) | ORAL | Status: DC
  Administered 2024-01-28 – 2024-01-30 (×4): 81 mg via ORAL
  Filled 2024-01-28 (×4): qty 1

## 2024-01-28 MED ORDER — STERILE WATER FOR IRRIGATION IR SOLN
Status: DC | PRN
  Administered 2024-01-28: 2000 mL

## 2024-01-28 MED ORDER — LIDOCAINE HCL (PF) 2 % IJ SOLN
INTRAMUSCULAR | Status: AC
Start: 2024-01-28 — End: 2024-01-28
  Filled 2024-01-28: qty 5

## 2024-01-28 MED ORDER — METHOCARBAMOL 500 MG PO TABS
500.0000 mg | ORAL_TABLET | Freq: Four times a day (QID) | ORAL | Status: DC | PRN
  Administered 2024-01-28 – 2024-01-29 (×4): 500 mg via ORAL
  Filled 2024-01-28 (×4): qty 1

## 2024-01-28 MED ORDER — LACTATED RINGERS IV SOLN: INTRAVENOUS | Status: DC

## 2024-01-28 MED ORDER — DOCUSATE SODIUM 100 MG PO CAPS
100.0000 mg | ORAL_CAPSULE | Freq: Two times a day (BID) | ORAL | Status: DC
  Administered 2024-01-28 – 2024-01-30 (×4): 100 mg via ORAL
  Filled 2024-01-28 (×4): qty 1

## 2024-01-28 MED ORDER — CEFAZOLIN SODIUM-DEXTROSE 2-4 GM/100ML-% IV SOLN
2.0000 g | INTRAVENOUS | Status: AC
  Administered 2024-01-28: 2 g via INTRAVENOUS
  Filled 2024-01-28: qty 100

## 2024-01-28 MED ORDER — METOCLOPRAMIDE HCL 5 MG PO TABS: 5.0000 mg | ORAL_TABLET | Freq: Three times a day (TID) | ORAL | Status: DC | PRN

## 2024-01-28 MED ORDER — CEFAZOLIN SODIUM-DEXTROSE 2-4 GM/100ML-% IV SOLN
2.0000 g | Freq: Four times a day (QID) | INTRAVENOUS | Status: AC
  Administered 2024-01-28 (×2): 2 g via INTRAVENOUS
  Filled 2024-01-28 (×2): qty 100

## 2024-01-28 MED ORDER — DEXAMETHASONE SODIUM PHOSPHATE 10 MG/ML IJ SOLN
INTRAMUSCULAR | Status: AC
  Filled 2024-01-28: qty 1

## 2024-01-28 MED ORDER — FENTANYL CITRATE (PF) 100 MCG/2ML IJ SOLN
INTRAMUSCULAR | Status: DC | PRN
  Administered 2024-01-28: 100 ug via INTRAVENOUS

## 2024-01-28 MED ORDER — ONDANSETRON HCL 4 MG/2ML IJ SOLN: 4.0000 mg | Freq: Once | INTRAMUSCULAR | Status: DC | PRN

## 2024-01-28 MED ORDER — MENTHOL 3 MG MT LOZG: 1.0000 | LOZENGE | OROMUCOSAL | Status: DC | PRN

## 2024-01-28 MED ORDER — HYDROCODONE-ACETAMINOPHEN 5-325 MG PO TABS
1.0000 | ORAL_TABLET | ORAL | Status: DC | PRN
  Administered 2024-01-28 – 2024-01-29 (×2): 1 via ORAL
  Administered 2024-01-29 (×2): 2 via ORAL
  Administered 2024-01-29 – 2024-01-30 (×4): 1 via ORAL
  Filled 2024-01-28 (×6): qty 1
  Filled 2024-01-28: qty 2
  Filled 2024-01-28 (×2): qty 1

## 2024-01-28 MED ORDER — FENTANYL CITRATE PF 50 MCG/ML IJ SOSY
PREFILLED_SYRINGE | INTRAMUSCULAR | Status: AC
  Filled 2024-01-28: qty 1

## 2024-01-28 MED ORDER — EPHEDRINE SULFATE-NACL 50-0.9 MG/10ML-% IV SOSY
PREFILLED_SYRINGE | INTRAVENOUS | Status: DC | PRN
Start: 2024-01-28 — End: 2024-01-28
  Administered 2024-01-28: 10 mg via INTRAVENOUS

## 2024-01-28 MED ORDER — PROPOFOL 10 MG/ML IV BOLUS
INTRAVENOUS | Status: DC | PRN
Start: 2024-01-28 — End: 2024-01-28
  Administered 2024-01-28: 150 mg via INTRAVENOUS

## 2024-01-28 MED ORDER — GLYCOPYRROLATE PF 0.2 MG/ML IJ SOSY
PREFILLED_SYRINGE | INTRAMUSCULAR | Status: DC | PRN
Start: 2024-01-28 — End: 2024-01-28
  Administered 2024-01-28: .2 mg via INTRAVENOUS

## 2024-01-28 MED ORDER — PHENOL 1.4 % MT LIQD: 1.0000 | OROMUCOSAL | Status: DC | PRN

## 2024-01-28 MED ORDER — MIDAZOLAM HCL 2 MG/2ML IJ SOLN
INTRAMUSCULAR | Status: AC
  Filled 2024-01-28: qty 2

## 2024-01-28 MED ORDER — 0.9 % SODIUM CHLORIDE (POUR BTL) OPTIME
TOPICAL | Status: DC | PRN
  Administered 2024-01-28: 1000 mL

## 2024-01-28 MED ORDER — HYDROMORPHONE HCL 1 MG/ML IJ SOLN
0.5000 mg | INTRAMUSCULAR | Status: DC | PRN
  Filled 2024-01-28 (×2): qty 1

## 2024-01-28 MED ORDER — OXYCODONE HCL 5 MG PO TABS
5.0000 mg | ORAL_TABLET | Freq: Once | ORAL | Status: AC | PRN
  Administered 2024-01-28: 5 mg via ORAL

## 2024-01-28 MED ORDER — ROCURONIUM BROMIDE 10 MG/ML (PF) SYRINGE
PREFILLED_SYRINGE | INTRAVENOUS | Status: AC
Start: 2024-01-28 — End: 2024-01-28
  Filled 2024-01-28: qty 10

## 2024-01-28 MED ORDER — OXYCODONE HCL 5 MG PO TABS
5.0000 mg | ORAL_TABLET | ORAL | Status: DC | PRN
  Administered 2024-01-28: 10 mg via ORAL
  Filled 2024-01-28: qty 2

## 2024-01-28 MED ORDER — POLYETHYLENE GLYCOL 3350 17 G PO PACK: 17.0000 g | PACK | Freq: Every day | ORAL | Status: DC | PRN

## 2024-01-28 MED ORDER — FENTANYL CITRATE (PF) 100 MCG/2ML IJ SOLN
INTRAMUSCULAR | Status: AC
  Filled 2024-01-28: qty 2

## 2024-01-28 MED ORDER — ROCURONIUM BROMIDE 100 MG/10ML IV SOLN
INTRAVENOUS | Status: DC | PRN
  Administered 2024-01-28: 60 mg via INTRAVENOUS

## 2024-01-28 MED ORDER — OXYCODONE HCL 5 MG PO TABS
ORAL_TABLET | ORAL | Status: AC
  Filled 2024-01-28: qty 1

## 2024-01-28 MED ORDER — FENTANYL CITRATE PF 50 MCG/ML IJ SOSY
PREFILLED_SYRINGE | INTRAMUSCULAR | Status: AC
  Filled 2024-01-28: qty 2

## 2024-01-28 MED ORDER — METHOCARBAMOL 500 MG PO TABS
ORAL_TABLET | ORAL | Status: AC
  Filled 2024-01-28: qty 1

## 2024-01-28 MED ORDER — FENTANYL CITRATE PF 50 MCG/ML IJ SOSY
25.0000 ug | PREFILLED_SYRINGE | INTRAMUSCULAR | Status: DC | PRN
  Administered 2024-01-28 (×3): 50 ug via INTRAVENOUS

## 2024-01-28 MED ORDER — DEXAMETHASONE SODIUM PHOSPHATE 10 MG/ML IJ SOLN
INTRAMUSCULAR | Status: DC | PRN
  Administered 2024-01-28: 10 mg via INTRAVENOUS

## 2024-01-28 MED ORDER — OXYCODONE HCL 5 MG/5ML PO SOLN: 5.0000 mg | Freq: Once | ORAL | Status: AC | PRN

## 2024-01-28 MED ORDER — CHLORHEXIDINE GLUCONATE 0.12 % MT SOLN
15.0000 mL | Freq: Once | OROMUCOSAL | Status: AC
  Administered 2024-01-28: 15 mL via OROMUCOSAL

## 2024-01-28 MED ORDER — PROPOFOL 1000 MG/100ML IV EMUL
INTRAVENOUS | Status: AC
  Filled 2024-01-28: qty 100

## 2024-01-28 MED ORDER — ACETAMINOPHEN 325 MG PO TABS
325.0000 mg | ORAL_TABLET | Freq: Four times a day (QID) | ORAL | Status: DC | PRN
  Filled 2024-01-28: qty 2

## 2024-01-28 MED ORDER — ONDANSETRON HCL 4 MG/2ML IJ SOLN
INTRAMUSCULAR | Status: DC | PRN
  Administered 2024-01-28: 4 mg via INTRAVENOUS

## 2024-01-28 MED ORDER — LIDOCAINE HCL (CARDIAC) PF 100 MG/5ML IV SOSY
PREFILLED_SYRINGE | INTRAVENOUS | Status: DC | PRN
  Administered 2024-01-28: 100 mg via INTRAVENOUS

## 2024-01-28 MED ORDER — SUGAMMADEX SODIUM 200 MG/2ML IV SOLN
INTRAVENOUS | Status: DC | PRN
  Administered 2024-01-28: 200 mg via INTRAVENOUS

## 2024-01-28 MED ORDER — ACETAMINOPHEN 500 MG PO TABS
1000.0000 mg | ORAL_TABLET | Freq: Once | ORAL | Status: AC
  Administered 2024-01-28: 1000 mg via ORAL
  Filled 2024-01-28: qty 2

## 2024-01-28 MED ORDER — SODIUM CHLORIDE 0.9 % IV SOLN
INTRAVENOUS | Status: DC
Start: 2024-01-28 — End: 2024-01-30

## 2024-01-28 MED ORDER — METOCLOPRAMIDE HCL 5 MG/ML IJ SOLN: 5.0000 mg | Freq: Three times a day (TID) | INTRAMUSCULAR | Status: DC | PRN

## 2024-01-28 MED ORDER — ORAL CARE MOUTH RINSE: 15.0000 mL | Freq: Once | OROMUCOSAL | Status: AC

## 2024-01-28 MED ORDER — LACTATED RINGERS IV SOLN: INTRAVENOUS | Status: DC | PRN

## 2024-01-28 MED ORDER — CELECOXIB 200 MG PO CAPS
200.0000 mg | ORAL_CAPSULE | Freq: Once | ORAL | Status: AC
  Administered 2024-01-28: 200 mg via ORAL
  Filled 2024-01-28: qty 1

## 2024-01-28 MED ORDER — ONDANSETRON HCL 4 MG/2ML IJ SOLN
INTRAMUSCULAR | Status: AC
  Filled 2024-01-28: qty 2

## 2024-01-28 MED ORDER — ONDANSETRON HCL 4 MG/2ML IJ SOLN: 4.0000 mg | Freq: Four times a day (QID) | INTRAMUSCULAR | Status: DC | PRN

## 2024-01-28 SURGICAL SUPPLY — 36 items
BAG COUNTER SPONGE SURGICOUNT (BAG) ×1 IMPLANT
BAG ZIPLOCK 12X15 (MISCELLANEOUS) ×2 IMPLANT
BENZOIN TINCTURE PRP APPL 2/3 (GAUZE/BANDAGES/DRESSINGS) IMPLANT
BLADE SAW SGTL 18X1.27X75 (BLADE) ×1 IMPLANT
COVER PERINEAL POST (MISCELLANEOUS) ×1 IMPLANT
COVER SURGICAL LIGHT HANDLE (MISCELLANEOUS) ×2 IMPLANT
DRAPE FOOT SWITCH (DRAPES) ×2 IMPLANT
DRAPE STERI IOBAN 125X83 (DRAPES) ×1 IMPLANT
DRAPE U-SHAPE 47X51 STRL (DRAPES) ×4 IMPLANT
DRSG AQUACEL AG ADV 3.5X10 (GAUZE/BANDAGES/DRESSINGS) ×2 IMPLANT
DURAPREP 26ML APPLICATOR (WOUND CARE) ×2 IMPLANT
ELECT PENCIL ROCKER SW 15FT (MISCELLANEOUS) ×1 IMPLANT
ELECT REM PT RETURN 15FT ADLT (MISCELLANEOUS) ×2 IMPLANT
GAUZE XEROFORM 1X8 LF (GAUZE/BANDAGES/DRESSINGS) IMPLANT
GLOVE BIO SURGEON STRL SZ7.5 (GLOVE) ×2 IMPLANT
GLOVE BIOGEL PI IND STRL 8 (GLOVE) ×4 IMPLANT
GLOVE ECLIPSE 8.0 STRL XLNG CF (GLOVE) ×2 IMPLANT
GOWN STRL REUS W/ TWL XL LVL3 (GOWN DISPOSABLE) ×2 IMPLANT
HEAD M SROM 36MM PLUS 1.5 (Hips) IMPLANT
HOLDER FOLEY CATH W/STRAP (MISCELLANEOUS) ×1 IMPLANT
KIT TURNOVER KIT A (KITS) ×1 IMPLANT
LINER NEUTRAL 52X36MM PLUS 4 (Liner) IMPLANT
PACK ANTERIOR HIP CUSTOM (KITS) ×2 IMPLANT
PIN SECTOR W/GRIP ACE CUP 52MM (Hips) IMPLANT
SET HNDPC FAN SPRY TIP SCT (DISPOSABLE) ×2 IMPLANT
STAPLER SKIN PROX 35W (STAPLE) IMPLANT
STEM FEM ACTIS HIGH SZ7 (Stem) IMPLANT
STRIP CLOSURE SKIN 1/2X4 (GAUZE/BANDAGES/DRESSINGS) IMPLANT
SUT ETHIBOND NAB CT1 #1 30IN (SUTURE) ×2 IMPLANT
SUT ETHILON 2 0 PS N (SUTURE) IMPLANT
SUT MNCRL AB 4-0 PS2 18 (SUTURE) IMPLANT
SUT VIC AB 0 CT1 36 (SUTURE) ×2 IMPLANT
SUT VIC AB 1 CT1 36 (SUTURE) ×2 IMPLANT
SUT VIC AB 2-0 CT1 TAPERPNT 27 (SUTURE) ×4 IMPLANT
TRAY FOLEY MTR SLVR 16FR STAT (SET/KITS/TRAYS/PACK) ×2 IMPLANT
YANKAUER SUCT BULB TIP NO VENT (SUCTIONS) ×2 IMPLANT

## 2024-01-28 NOTE — TOC Transition Note (Addendum)
 Transition of Care [facility]) - Discharge Note   Patient Details  Name: SISSY GOETZKE MRN: [id number] Date of Birth: [birth year]  Transition of Care [facility]) CM/SW Contact:  NORMAN ASPEN, LCSW Phone Number: 01/28/2024, 1:43 PM   Clinical Narrative:     ADDENDUM: Pt declined RW when delivered as it does not fit hip width.  PT aware.  Met with pt who reports that she has a rollator in the home but it's old. Pt agreeable with TOC ordering a RW and has no DME agency preference.  Order placed with [employer] and RW to be delivered to room prior to discharge.  HHPT prearranged with [facility] via ortho MD office prior to surgery.  No further TOC needs.   Final next level of care: Home w Home Health Services Barriers to Discharge: No Barriers Identified   Patient Goals and CMS Choice Patient states their goals for this hospitalization and ongoing recovery are:: return home          Discharge Placement                       Discharge Plan and Services Additional resources added to the After Visit Summary for                  DME Arranged: Walker rolling DME Agency: [employer] Date DME Agency Contacted: 01/28/24   Representative spoke with at DME Agency: Darly[facility] HH Arranged: PT HH Agency: [facility]        Social Drivers of Health (SDOH) Interventions SDOH Screenings   Food Insecurity: No Food Insecurity (01/28/2024)  Housing: Low Risk  (01/28/2024)  Transportation Needs: No Transportation Needs (01/28/2024)  Utilities: Not At Risk (01/28/2024)  Alcohol Screen: Low Risk  (12/12/2019)  Depression (PHQ2-9): Low Risk  (11/27/2020)  Financial Resource Strain: Low Risk  (12/12/2019)  Physical Activity: Inactive (12/12/2019)  Social Connections: Moderately Isolated (01/28/2024)  Stress: No Stress Concern Present (12/12/2019)  Tobacco Use: Low Risk  (01/28/2024)     Readmission Risk Interventions     No data to display

## 2024-01-28 NOTE — Transfer of Care (Signed)
 Immediate Anesthesia Transfer of Care Note  Patient: [name]  Procedure(s) Performed: ARTHROPLASTY, HIP, TOTAL, ANTERIOR APPROACH (Right: Hip)  Patient Location: PACU  Anesthesia Type:General  Level of Consciousness: awake and drowsy  Airway & Oxygen Therapy: Patient Spontanous Breathing and Patient connected to face mask oxygen  Post-op Assessment: Report given to RN and Post -op Vital signs reviewed and stable  Post vital signs: Reviewed and stable  Last Vitals:  Vitals Value Taken Time  BP 121/65 01/28/24 08:49  Temp    Pulse 81 01/28/24 08:51  Resp 20 01/28/24 08:51  SpO2 100 % 01/28/24 08:51  Vitals shown include unfiled device data.  Last Pain:  Vitals:   01/28/24 0622  TempSrc:   PainSc: 3          Complications: No notable events documented.

## 2024-01-28 NOTE — Plan of Care (Signed)

## 2024-01-28 NOTE — Progress Notes (Signed)
 Pt started complaining of numbness/tingling and warm flushed feeling in both arms and legs after taking oxycodone . vitals signs taken BP 117/58, o2 96%, resp 16, temp 98.2, pulse 86. no SOB or wheezing noted. no hives or rashes noted. pt is alert and responding back very well. pt said she had this reaction with oxycodone  years back when she had right hip done. pt requested if oxycodone  can be discontinued and hydrocodone  can be ordered instead. Dr Vernetta notified and new order given. O2 at 2L initiated for comfort. Encouraged to drink plenty of water . Foley catheter intact and draining year yellow urine. Pt resting with call bell in reach. Daughter at bedside.

## 2024-01-28 NOTE — Interval H&P Note (Signed)
 History and Physical Interval Note: The patient understands that she is here today for a right total hip replacement to treat her significant right hip pain and arthritis.  There has been no acute or interval change in her medical status.  The risks and benefits of surgery have been discussed in detail and informed consent has been obtained.  The right operative hip has been marked.  01/28/2024 7:04 AM  [name]  has presented today for surgery, with the diagnosis of OSTEOARTHRITIS RIGHT HIP.  The various methods of treatment have been discussed with the patient and family. After consideration of risks, benefits and other options for treatment, the patient has consented to  Procedure(s): ARTHROPLASTY, HIP, TOTAL, ANTERIOR APPROACH (Right) as a surgical intervention.  The patient's history has been reviewed, patient examined, no change in status, stable for surgery.  I have reviewed the patient's chart and labs.  Questions were answered to the patient's satisfaction.     Lonni CINDERELLA Poli

## 2024-01-28 NOTE — Anesthesia Postprocedure Evaluation (Signed)
 Anesthesia Post Note  Patient: [name]  Procedure(s) Performed: ARTHROPLASTY, HIP, TOTAL, ANTERIOR APPROACH (Right: Hip)     Patient location during evaluation: PACU Anesthesia Type: General Level of consciousness: awake and alert Pain management: pain level controlled Vital Signs Assessment: post-procedure vital signs reviewed and stable Respiratory status: spontaneous breathing, nonlabored ventilation, respiratory function stable and patient connected to nasal cannula oxygen Cardiovascular status: blood pressure returned to baseline and stable Postop Assessment: no apparent nausea or vomiting Anesthetic complications: no   No notable events documented.  Last Vitals:  Vitals:   01/28/24 0924 01/28/24 0930  BP:  112/78  Pulse:  72  Resp: 11 12  Temp:    SpO2:  97%    Last Pain:  Vitals:   01/28/24 0920  TempSrc:   PainSc: 6                  Rilda Bulls

## 2024-01-28 NOTE — Progress Notes (Signed)
 PT Cancellation Note  Patient Details Name: SIARAH DELEO MRN: [id number] DOB: [birth year]   Cancelled Treatment:     PT order received but eval deferred - on arrival to room pt reporting numbness in arms and expressed concern she is having reaction to meds which has happened before - RN alerted and in to assess pt.  RN requests PT deferred this date.  Will follow in am.   Leonor Darnell 01/28/2024, 3:59 PM

## 2024-01-28 NOTE — Op Note (Signed)
 Operative Note  Date of operation: 01/28/2024 Preoperative diagnosis: Right hip primary osteoarthritis Postoperative diagnosis: Same  Procedure: Right direct anterior total hip arthroplasty  Implants: Implant Name Type Inv. Item Serial No. Manufacturer Lot No. LRB No. Used Action  LINER NEUTRAL [id number] PLUS 4 - [id number] Liner LINER NEUTRAL [id number] PLUS 4  DEPU[employer]TH[employer] [id number] Right 1 Implanted  PIN SECTOR W/GRIP ACE CUP - [id number] Hips PIN SECTOR W/GRIP ACE CUP  DEPUY ORTHOPA[employer] [id number] Right 1 Implanted  STEM FEM ACTIS HIGH SZ7 - [id number] Stem STEM FEM ACTIS HIGH SZ7  DEPUY ORTHOPAEDICS [id number] Right 1 Implanted  HEAD M SROM PLUS 1.5 - [id number] Hips HEAD M SROM PLUS 1.5  DEPUY ORTHOPA[employer] [id number] Right 1 Implanted   Surgeon: Lonni GRADE. Vernetta, MD Assistant: Tory Gaskins, PA-C  Anesthesia: General EBL: 300 cc Antibiotics: IV Ancef  Complications: None  Indications: The patient is a 73 year old female well-known to us .  She has debilitating arthritis in her right hip.  She has had a left hip replacement and both knees replaced.  At this point her right hip pain is daily and it is detrimentally fighting her mobility, her quality of life and her actives daily living to the point she does wish to proceed with a hip replacement on the right side.  We did discuss in length in detail the risks of acute blood loss anemia, nerve and vessel injury, fracture, infection, DVT, dislocation, implant failure, leg length differences and wound healing issues.  She understands that our goals are hopefully decreased pain, improved mobility and improve quality of life.  Procedure description: After informed consent was obtained and the appropriate right hip was marked, the patient was brought to the operating room and general anesthesia was obtained where she is on the stretcher.  A Foley catheter is placed.  Traction boots were then placed on both her feet and she  was placed supine on the Hana fracture table with a perineal post and placed in both legs and inline skeletal traction devices but no traction applied.  The right operative hip was assessed radiographically as well as the pelvis.  The right hip was then prepped and draped with DuraPrep and sterile drapes.  A timeout was called and she was identified as the correct patient the correct right hip.  An incision was then made just inferior and posterior to the ASIS and carried slightly obliquely down the leg.  Dissection was carried down to the tensor fascia lata muscle and the tensor fascia was then divided longitudinally to proceed with a direct anterior approach the hip.  Circumflex vessels were identified and cauterized.  The hip capsule was identified and opened up finding a moderate joint effusion.  Cobra retractors were placed around the medial and lateral femoral neck and a femoral neck cut was made with an oscillating saw and this was completed with an osteotome.  A corkscrew was placed in the femoral head and the femoral head was removed in its entirety and there is definitely cartilage wear.  A bent Hohmann was then placed over the medial acetabular rim and remnants of the acetabular labrum and other debris removed.  Reaming was then initiated from a size 43 reamer and stepwise increments going up to a size 51 reamer with all reamers placed under direct visualization and the last reamer also placed under direct fluoroscopy in order to obtain the depth of reaming, the inclination and the anteversion.  The real DePuy sector GRIPTION acetabular component size 52  was then placed without difficulty followed by a 36+4 polythene liner.  Attention was then turned to the femur.  With the right leg externally rotated to 120 degrees, extended and adducted, a Mueller retractors placed medially and Hohmann tractor by the greater trochanter.  The lateral joint capsule was released and a box cutting osteotome was used in the  femoral canal.  Broaching was then initiated using the Actis broaching system from a size 0 going up to a size 7.  With a size 7 in place we trialed a high offset femoral neck and a 36+1.5 trial head ball.  The right leg was brought over and up and with traction and internal rotation reduced the pelvis.  Based on radiographic and clinical assessment we are pleased overall with stability as well as leg length and offset.  We dislocated the hip and removed the trial components.  We then placed the real Actis femoral component with high offset size 7 and the real 36+1.5 metal head ball.  Again this was reduced in the pelvis we are pleased with radiographic and clinical assessment of the hip.  The soft tissue was then irrigated normal saline solution.  Remnants of the joint capsule were closed with interrupted #1 Ethibond suture followed by #1 Vicryl close tensor fascia.  0 Vicryl was used to close the deep tissue and 2-0 Vicryl was used to close subcutaneous tissue.  The skin was closed with interrupted 2-0 nylon suture.  An Aquacel dressing was applied.  The patient was taken off the Hana table, awakened, extubated and taken the recovery room.  Tory Gaskins, PA-C did assist in the entire case and beginning in and his assistance was crucial and medically necessary for soft tissue management and retraction, helping guide implant placement and a layered closure of the wound.

## 2024-01-28 NOTE — Anesthesia Procedure Notes (Signed)
 Procedure Name: Intubation Date/Time: 01/28/2024 7:17 AM  Performed by: [place], Corean BROCKS, CRNAPre-anesthesia Checklist: Patient identified, Emergency Drugs available, Suction available and Patient being monitored Patient Re-evaluated:Patient Re-evaluated prior to induction Oxygen Delivery Method: Circle system utilized Preoxygenation: Pre-oxygenation with 100% oxygen Induction Type: IV induction Ventilation: Mask ventilation without difficulty Laryngoscope Size: Mac and 3 Grade View: Grade I Tube type: Oral Tube size: 7.0 mm Number of attempts: 1 Airway Equipment and Method: Stylet and Oral airway Placement Confirmation: ETT inserted through vocal cords under direct vision, positive ETCO2 and breath sounds checked- equal and bilateral Secured at: 21 cm Tube secured with: Tape Dental Injury: Teeth and Oropharynx as per pre-operative assessment

## 2024-01-29 DIAGNOSIS — M1611 Unilateral primary osteoarthritis, right hip: Secondary | ICD-10-CM | POA: Diagnosis not present

## 2024-01-29 LAB — BASIC METABOLIC PANEL WITH GFR
Anion gap: 11 (ref 5–15)
BUN: 9 mg/dL (ref 8–23)
CO2: 22 mmol/L (ref 22–32)
Calcium: 8.9 mg/dL (ref 8.9–10.3)
Chloride: 104 mmol/L (ref 98–111)
Creatinine, Ser: 0.52 mg/dL (ref 0.44–1.00)
GFR, Estimated: >60 mL/min (ref >60)
Glucose, Bld: 141 mg/dL — ABNORMAL HIGH (ref 70–99)
Potassium: 4.2 mmol/L (ref 3.5–5.1)
Sodium: 137 mmol/L (ref 135–145)

## 2024-01-29 LAB — CBC
HCT: 40.3 % (ref 36.0–46.0)
Hemoglobin: 12.4 g/dL (ref 12.0–15.0)
MCH: 28.8 pg (ref 26.0–34.0)
MCHC: 30.8 g/dL (ref 30.0–36.0)
MCV: 93.7 fL (ref 80.0–100.0)
Platelets: 179 K/uL (ref 150–400)
RBC: 4.3 MIL/uL (ref 3.87–5.11)
RDW: 13 % (ref 11.5–15.5)
WBC: 10.2 K/uL (ref 4.0–10.5)
nRBC: 0 % (ref 0.0–0.2)

## 2024-01-29 MED ORDER — METHOCARBAMOL 500 MG PO TABS: 500.0000 mg | ORAL_TABLET | Freq: Four times a day (QID) | ORAL | 1 refills | Status: AC | PRN

## 2024-01-29 MED ORDER — ASPIRIN 81 MG PO CHEW: 81.0000 mg | CHEWABLE_TABLET | Freq: Two times a day (BID) | ORAL | 0 refills | Status: AC

## 2024-01-29 MED ORDER — HYDROCODONE-ACETAMINOPHEN 5-325 MG PO TABS: 1.0000 | ORAL_TABLET | Freq: Four times a day (QID) | ORAL | 0 refills | Status: DC | PRN

## 2024-01-29 MED ORDER — SODIUM CHLORIDE 0.9 % IV BOLUS
500.0000 mL | Freq: Once | INTRAVENOUS | Status: AC
  Administered 2024-01-29: 500 mL via INTRAVENOUS

## 2024-01-29 NOTE — Progress Notes (Signed)
 Subjective: 1 Day Post-Op Procedure(s) (LRB): ARTHROPLASTY, HIP, TOTAL, ANTERIOR APPROACH (Right) Patient reports pain as moderate.  She is currently in a bedside chair.  She does report a drop in blood pressure when she got up today.  Her hemoglobin is only down to 12.4.  She does feel comfortable from the surgery itself having had joint replacements before.  Objective: Vital signs in last 24 hours: Temp:  [98 F (36.7 C)-98.8 F (37.1 C)] 98.8 F (37.1 C) (09/13 1043) Pulse Rate:  [65-90] 65 (09/13 1043) Resp:  [16-18] 17 (09/13 1043) BP: (102-129)/(51-80) 102/80 (09/13 1043) SpO2:  [94 %-98 %] 94 % (09/13 1043)  Intake/Output from previous day: 09/12 0701 - 09/13 0700 In: 3203.5 [P.O.:1157; I.V.:1646.5; IV Piggyback:400] Out: 3100 [Urine:2800; Blood:300] Intake/Output this shift: No intake/output data recorded.  Recent Labs    01/29/24 0352  HGB 12.4   Recent Labs    01/29/24 0352  WBC 10.2  RBC 4.30  HCT 40.3  PLT 179   Recent Labs    01/29/24 0352  NA 137  K 4.2  CL 104  CO2 22  BUN 9  CREATININE 0.52  GLUCOSE 141*  CALCIUM 8.9   No results for input(s): LABPT, INR in the last 72 hours.  Sensation intact distally Intact pulses distally Dorsiflexion/Plantar flexion intact Incision: dressing C/D/I   Assessment/Plan: 1 Day Post-Op Procedure(s) (LRB): ARTHROPLASTY, HIP, TOTAL, ANTERIOR APPROACH (Right) Up with therapy Plan for discharge tomorrow Discharge home with home health Will definitely give her a fluid bolus this morning and plan for discharge home tomorrow.  She is otherwise comfortable and motivated.     Lonni CINDERELLA Poli 01/29/2024, 10:47 AM

## 2024-01-29 NOTE — Discharge Instructions (Signed)
 Per [facility] clinic policy, our goal is ensure optimal postoperative pain control with a multimodal pain management strategy. For all [facility] patients, our goal is to wean post-operative narcotic medications by 6 weeks post-operatively. If this is not possible due to utilization of pain medication prior to surgery, your [facility] doctor will support your acute post-operative pain control for the first 6 weeks postoperatively, with a plan to transition you back to your primary pain team following that. [name] will work to ensure a Therapist, occupational.  INSTRUCTIONS AFTER JOINT REPLACEMENT   Remove items at home which could result in a fall. This includes throw rugs or furniture in walking pathways ICE to the affected joint every three hours while awake for 30 minutes at a time, for at least the first 3-5 days, and then as needed for pain and swelling.  Continue to use ice for pain and swelling. You may notice swelling that will progress down to the foot and ankle.  This is normal after surgery.  Elevate your leg when you are not up walking on it.   Continue to use the breathing machine you got in the hospital (incentive spirometer) which will help keep your temperature down.  It is common for your temperature to cycle up and down following surgery, especially at night when you are not up moving around and exerting yourself.  The breathing machine keeps your lungs expanded and your temperature down.   DIET:  As you were doing prior to hospitalization, we recommend a well-balanced diet.  DRESSING / WOUND CARE / SHOWERING  Keep the surgical dressing until follow up.  The dressing is water  proof, so you can shower without any extra covering.  IF THE DRESSING FALLS OFF or the wound gets wet inside, change the dressing with sterile gauze.  Please use good hand washing techniques before changing the dressing.  Do not use any lotions or creams on the incision until instructed by your surgeon.     ACTIVITY  Increase activity slowly as tolerated, but follow the weight bearing instructions below.   No driving for 6 weeks or until further direction given by your physician.  You cannot drive while taking narcotics.  No lifting or carrying greater than 10 lbs. until further directed by your surgeon. Avoid periods of inactivity such as sitting longer than an hour when not asleep. This helps prevent blood clots.  You may return to work once you are authorized by your doctor.     WEIGHT BEARING   Weight bearing as tolerated with assist device (walker, cane, etc) as directed, use it as long as suggested by your surgeon or therapist, typically at least 4-6 weeks.   EXERCISES  Results after joint replacement surgery are often greatly improved when you follow the exercise, range of motion and muscle strengthening exercises prescribed by your doctor. Safety measures are also important to protect the joint from further injury. Any time any of these exercises cause you to have increased pain or swelling, decrease what you are doing until you are comfortable again and then slowly increase them. If you have problems or questions, call your caregiver or physical therapist for advice.   Rehabilitation is important following a joint replacement. After just a few days of immobilization, the muscles of the leg can become weakened and shrink (atrophy).  These exercises are designed to build up the tone and strength of the thigh and leg muscles and to improve motion. Often times heat used for twenty to thirty minutes before  working out will loosen up your tissues and help with improving the range of motion but do not use heat for the first two weeks following surgery (sometimes heat can increase post-operative swelling).   These exercises can be done on a training (exercise) mat, on the floor, on a table or on a bed. Use whatever works the best and is most comfortable for you.    Use music or television  while you are exercising so that the exercises are a pleasant break in your day. This will make your life better with the exercises acting as a break in your routine that you can look forward to.   Perform all exercises about fifteen times, three times per day or as directed.  You should exercise both the operative leg and the other leg as well.  Exercises include:   Quad Sets - Tighten up the muscle on the front of the thigh (Quad) and hold for 5-10 seconds.   Straight Leg Raises - With your knee straight (if you were given a brace, keep it on), lift the leg to 60 degrees, hold for 3 seconds, and slowly lower the leg.  Perform this exercise against resistance later as your leg gets stronger.  Leg Slides: Lying on your back, slowly slide your foot toward your buttocks, bending your knee up off the floor (only go as far as is comfortable). Then slowly slide your foot back down until your leg is flat on the floor again.  Angel Wings: Lying on your back spread your legs to the side as far apart as you can without causing discomfort.  Hamstring Strength:  Lying on your back, push your heel against the floor with your leg straight by tightening up the muscles of your buttocks.  Repeat, but this time bend your knee to a comfortable angle, and push your heel against the floor.  You may put a pillow under the heel to make it more comfortable if necessary.   A rehabilitation program following joint replacement surgery can speed recovery and prevent re-injury in the future due to weakened muscles. Contact your doctor or a physical therapist for more information on knee rehabilitation.    CONSTIPATION  Constipation is defined medically as fewer than three stools per week and severe constipation as less than one stool per week.  Even if you have a regular bowel pattern at home, your normal regimen is likely to be disrupted due to multiple reasons following surgery.  Combination of anesthesia, postoperative  narcotics, change in appetite and fluid intake all can affect your bowels.   YOU MUST use at least one of the following options; they are listed in order of increasing strength to get the job done.  They are all available over the counter, and you may need to use some, POSSIBLY even all of these options:    Drink plenty of fluids (prune juice may be helpful) and high fiber foods Colace 100 mg by mouth twice a day  Senokot for constipation as directed and as needed Dulcolax (bisacodyl), take with full glass of water  Miralax (polyethylene glycol) once or twice a day as needed.  If you have tried all these things and are unable to have a bowel movement in the first 3-4 days after surgery call either your surgeon or your primary doctor.    If you experience loose stools or diarrhea, hold the medications until you stool forms back up.  If your symptoms do not get better within 1 week  or if they get worse, check with your doctor.  If you experience the worst abdominal pain ever or develop nausea or vomiting, please contact the office immediately for further recommendations for treatment.   ITCHING:  If you experience itching with your medications, try taking only a single pain pill, or even half a pain pill at a time.  You can also use Benadryl  over the counter for itching or also to help with sleep.   TED HOSE STOCKINGS:  Use stockings on both legs until for at least 2 weeks or as directed by physician office. They may be removed at night for sleeping.  MEDICATIONS:  See your medication summary on the "After Visit Summary" that nursing will review with you.  You may have some home medications which will be placed on hold until you complete the course of blood thinner medication.  It is important for you to complete the blood thinner medication as prescribed.  PRECAUTIONS:  If you experience chest pain or shortness of breath - call 911 immediately for transfer to the hospital emergency department.    If you develop a fever greater that 101 F, purulent drainage from wound, increased redness or drainage from wound, foul odor from the wound/dressing, or calf pain - CONTACT YOUR SURGEON.                                                   FOLLOW-UP APPOINTMENTS:  If you do not already have a post-op appointment, please call the office for an appointment to be seen by your surgeon.  Guidelines for how soon to be seen are listed in your "After Visit Summary", but are typically between 1-4 weeks after surgery.  OTHER INSTRUCTIONS:   Knee Replacement:  Do not place pillow under knee, focus on keeping the knee straight while resting. CPM instructions: 0-90 degrees, 2 hours in the morning, 2 hours in the afternoon, and 2 hours in the evening. Place foam block, curve side up under heel at all times except when in CPM or when walking.  DO NOT modify, tear, cut, or change the foam block in any way.  POST-OPERATIVE OPIOID TAPER INSTRUCTIONS: It is important to wean off of your opioid medication as soon as possible. If you do not need pain medication after your surgery it is ok to stop day one. Opioids include: Codeine, Hydrocodone(Norco, Vicodin), Oxycodone (Percocet, oxycontin ) and hydromorphone  amongst others.  Long term and even short term use of opiods can cause: Increased pain response Dependence Constipation Depression Respiratory depression And more.  Withdrawal symptoms can include Flu like symptoms Nausea, vomiting And more Techniques to manage these symptoms Hydrate well Eat regular healthy meals Stay active Use relaxation techniques(deep breathing, meditating, yoga) Do Not substitute Alcohol to help with tapering If you have been on opioids for less than two weeks and do not have pain than it is ok to stop all together.  Plan to wean off of opioids This plan should start within one week post op of your joint replacement. Maintain the same interval or time between taking each dose  and first decrease the dose.  Cut the total daily intake of opioids by one tablet each day Next start to increase the time between doses. The last dose that should be eliminated is the evening dose.   MAKE SURE YOU:  Understand these instructions.  Get help right away if you are not doing well or get worse.    Thank you for letting us  be a part of your medical care team.  It is a privilege we respect greatly.  We hope these instructions will help you stay on track for a fast and full recovery!     Dental Antibiotics:  In most cases prophylactic antibiotics for Dental procdeures after total joint surgery are not necessary.  Exceptions are as follows:  1. History of prior total joint infection  2. Severely immunocompromised (Organ Transplant, cancer chemotherapy, Rheumatoid biologic meds such as Humera)  3. Poorly controlled diabetes (A1C &gt; 8.0, blood glucose over 200)  If you have one of these conditions, contact your surgeon for an antibiotic prescription, prior to your dental procedure.

## 2024-01-29 NOTE — Progress Notes (Signed)
 Physical Therapy Treatment Patient Details Name: [name] MRN: [id number] DOB: 09/30/1950 Today's Date: 01/29/2024   History of Present Illness Pt s/p R THR and with hx of L THR, bil TKR, CAD, obesity, R RCR,  L ankle fusion, and lumbar spinal stenosis    PT Comments  Pt with c/o increased pain and fatigue as well as feeling very cold.  Pt requests mobility deferred but agreeable to bed therex.  Pt still hopeful for dc home tomorrow.    If plan is discharge home, recommend the following: [name] with walking and/or transfers;[name] with bathing/dressing/bathroom;Assistance with cooking/housework;Assist for transportation;Help with stairs or ramp for entrance   Can travel by private vehicle        Equipment Recommendations  Rolling walker (2 wheels)    Recommendations for Other Services       Precautions / Restrictions Precautions Precautions: Fall Restrictions Weight Bearing Restrictions Per Provider Order: Yes RLE Weight Bearing Per Provider Order: Weight bearing as tolerated     Mobility  Bed Mobility Overal bed mobility: Needs Assistance Bed Mobility: Supine to Sit     Supine to sit: Min assist, +2 for physical assistance, +2 for safety/equipment, HOB elevated     General bed mobility comments: cues for sequence and use of L LE to self assist; physical assist to manage R LE and to control trunk.  Pt with pull straps and slide equipmet from home to self assist    Transfers Overall transfer level: Needs assistance Equipment used: Rolling walker (2 wheels) Transfers: Sit to/from Stand, Bed to chair/wheelchair/BSC Sit to Stand: Min assist, +2 physical assistance, +2 safety/equipment, From elevated surface   Step pivot transfers: Min assist, +2 physical assistance, +2 safety/equipment, From elevated surface       General transfer comment: cues for LE management and use of UEs to self assist; step pvt bed to chair with RW    Ambulation/Gait                General Gait Details: step pvt bed to chair only 2* C/O feeling suddenly weak - BP 92/57   Stairs             Wheelchair Mobility     Tilt Bed    Modified Rankin (Stroke Patients Only)       Balance Overall balance assessment: Needs assistance Sitting-balance support: Feet supported, No upper extremity supported Sitting balance-Leahy Scale: Good     Standing balance support: Bilateral upper extremity supported Standing balance-Leahy Scale: Poor                              Communication Communication Communication: No apparent difficulties  Cognition Arousal: Alert Behavior During Therapy: WFL for tasks assessed/performed   PT - Cognitive impairments: No apparent impairments                         Following commands: Intact      Cueing Cueing Techniques: Verbal cues  Exercises Total Joint Exercises Ankle Circles/Pumps: AROM, Both, 15 reps, Supine Quad Sets: AROM, Both, 10 reps, Supine Heel Slides: Right, AAROM, 20 reps, Supine Hip ABduction/ADduction: AAROM, Right, 15 reps, Supine    General Comments        Pertinent Vitals/Pain Pain Assessment Pain Assessment: 0-10 Pain Score: 5  Pain Location: R hip and thigh Pain Descriptors / Indicators: Aching, Sore Pain Intervention(s): Limited activity within patient's  tolerance, Monitored during session, Premedicated before session    Home Living Family/patient expects to be discharged to:: Private residence Living Arrangements: Other relatives;Children Available Help at Discharge: Family;Available 24 hours/day Type of Home: House Home Access: Ramped entrance       Home Layout: One level Home Equipment: Rollator (4 wheels);Toilet riser;Grab bars - toilet      Prior Function            PT Goals (current goals can now be found in the care plan section) Acute Rehab PT Goals Patient Stated Goal: Regain IND PT Goal Formulation: With patient Time For Goal  Achievement: 02/05/24 Potential to Achieve Goals: Good Progress towards PT goals: Not progressing toward goals - comment (ltd by fatigue)    Frequency    7X/week      PT Plan      Co-evaluation              AM-PAC PT 6 Clicks Mobility   Outcome Measure  Help needed turning from your back to your side while in a flat bed without using bedrails?: A Lot Help needed moving from lying on your back to sitting on the side of a flat bed without using bedrails?: A Lot Help needed moving to and from a bed to a chair (including a wheelchair)?: A Lot Help needed standing up from a chair using your arms (e.g., wheelchair or bedside chair)?: [name] needed to walk in hospital room?: Total Help needed climbing 3-5 steps with a railing? : Total 6 Click Score: 11    End of Session Equipment Utilized During Treatment: Gait belt Activity Tolerance: Patient limited by fatigue Patient left: in bed;with call bell/phone within reach   PT Visit Diagnosis: Unsteadiness on feet (R26.81);Difficulty in walking, not elsewhere classified (R26.2)     Time: 8454-8393 PT Time Calculation (min) (ACUTE ONLY): 21 min  Charges:    $Therapeutic Exercise: 8-22 mins PT General Charges $$ ACUTE PT VISIT: 1 Visit                     [facility] PT Acute Rehabilitation Services Office [phone]    [name] 01/29/2024, 4:11 PM

## 2024-01-29 NOTE — Plan of Care (Signed)

## 2024-01-29 NOTE — Evaluation (Signed)
 Physical Therapy Evaluation Patient Details Name: [name] MRN: [id number] DOB: March 08, 1951 Today's Date: 01/29/2024  History of Present Illness  Pt s/p R THR and with hx of L THR, bil TKR, CAD, obesity, R RCR,  L ankle fusion, and lumbar spinal stenosis  Clinical Impression  Pt s/p R THR  and presents with decreased R LE strength/ROM, post op pain and orthostatic hypotension (92/57 with onset sudden fatigue but no dizziness with standing) with attempt to mobilize limiting functional mobility.  Pt very cooperative and should progress to dc home with family assist.      If plan is discharge home, recommend the following: [name] with walking and/or transfers;[name] with bathing/dressing/bathroom;Assistance with cooking/housework;Assist for transportation;Help with stairs or ramp for entrance   Can travel by private vehicle        Equipment Recommendations Rolling walker (2 wheels) (Pt needs a wide RW 2* body habitus but does not qualify based on insurance guidelines for weight - pt is investigating cost of appropriate RW from outside sources)  Recommendations for Other Services       Functional Status Assessment Patient has had a recent decline in their functional status and demonstrates the ability to make significant improvements in function in a reasonable and predictable amount of time.     Precautions / Restrictions Precautions Precautions: Fall Restrictions Weight Bearing Restrictions Per Provider Order: Yes RLE Weight Bearing Per Provider Order: Weight bearing as tolerated      Mobility  Bed Mobility Overal bed mobility: Needs Assistance Bed Mobility: Supine to Sit     Supine to sit: Min assist, +2 for physical assistance, +2 for safety/equipment, HOB elevated     General bed mobility comments: cues for sequence and use of L LE to self assist; physical assist to manage R LE and to control trunk.  Pt with pull straps and slide equipmet from home to  self assist    Transfers Overall transfer level: Needs assistance Equipment used: Rolling walker (2 wheels) Transfers: Sit to/from Stand, Bed to chair/wheelchair/BSC Sit to Stand: Min assist, +2 physical assistance, +2 safety/equipment, From elevated surface   Step pivot transfers: Min assist, +2 physical assistance, +2 safety/equipment, From elevated surface       General transfer comment: cues for LE management and use of UEs to self assist; step pvt bed to chair with RW    Ambulation/Gait               General Gait Details: step pvt bed to chair only 2* C/O feeling suddenly weak - BP 92/57  Stairs            Wheelchair Mobility     Tilt Bed    Modified Rankin (Stroke Patients Only)       Balance Overall balance assessment: Needs assistance Sitting-balance support: Feet supported, No upper extremity supported Sitting balance-Leahy Scale: Good     Standing balance support: Bilateral upper extremity supported Standing balance-Leahy Scale: Poor                               Pertinent Vitals/Pain Pain Assessment Pain Assessment: 0-10 Pain Score: 5  Pain Location: R hip and thigh Pain Descriptors / Indicators: Aching, Sore Pain Intervention(s): Limited activity within patient's tolerance, Monitored during session, Premedicated before session, Ice applied    Home Living Family/patient expects to be discharged to:: Private residence Living Arrangements: Other relatives;Children Available Help at Discharge:  Family;Available 24 hours/day Type of Home: House Home Access: Ramped entrance       Home Layout: One level Home Equipment: Rollator (4 wheels);Toilet riser;Grab bars - toilet      Prior Function Prior Level of Function : Independent/Modified Independent                     Extremity/Trunk Assessment   Upper Extremity Assessment Upper Extremity Assessment: Overall WFL for tasks assessed    Lower Extremity  Assessment Lower Extremity Assessment: RLE deficits/detail RLE Deficits / Details: AAROM at hip to 85 flex and 15 abd; strength at hip 2+/5    Cervical / Trunk Assessment Cervical / Trunk Assessment: Normal  Communication   Communication Communication: No apparent difficulties    Cognition Arousal: Alert Behavior During Therapy: WFL for tasks assessed/performed   PT - Cognitive impairments: No apparent impairments                         Following commands: Intact       Cueing Cueing Techniques: Verbal cues     General Comments      Exercises Total Joint Exercises Ankle Circles/Pumps: AROM, Both, 15 reps, Supine Quad Sets: AROM, Both, 10 reps, Supine Heel Slides: Right, AAROM, 20 reps, Supine Hip ABduction/ADduction: AAROM, Right, 15 reps, Supine   Assessment/Plan    PT Assessment Patient needs continued PT services  PT Problem List Decreased strength;Decreased range of motion;Decreased activity tolerance;Decreased balance;Decreased mobility;Obesity;Pain;Decreased knowledge of use of DME       PT Treatment Interventions DME instruction;Gait training;Stair training;Functional mobility training;Therapeutic activities;Therapeutic exercise;Balance training;Patient/family education    PT Goals (Current goals can be found in the Care Plan section)  Acute Rehab PT Goals Patient Stated Goal: Regain IND PT Goal Formulation: With patient Time For Goal Achievement: 02/05/24 Potential to Achieve Goals: Good    Frequency 7X/week     Co-evaluation               AM-PAC PT 6 Clicks Mobility  Outcome Measure Help needed turning from your back to your side while in a flat bed without using bedrails?: A Lot Help needed moving from lying on your back to sitting on the side of a flat bed without using bedrails?: A Lot Help needed moving to and from a bed to a chair (including a wheelchair)?: A Lot Help needed standing up from a chair using your arms (e.g.,  wheelchair or bedside chair)?: [name] needed to walk in hospital room?: Total Help needed climbing 3-5 steps with a railing? : Total 6 Click Score: 11    End of Session Equipment Utilized During Treatment: Gait belt Activity Tolerance: Patient tolerated treatment well Patient left: in chair;with call bell/phone within reach;with chair alarm set Nurse Communication: Mobility status;Other (comment) (BP) PT Visit Diagnosis: Unsteadiness on feet (R26.81);Difficulty in walking, not elsewhere classified (R26.2)    Time: 9084-9052 PT Time Calculation (min) (ACUTE ONLY): 32 min   Charges:   PT Evaluation $PT Eval Low Complexity: 1 Low PT Treatments $Therapeutic Exercise: 8-22 mins PT General Charges $$ ACUTE PT VISIT: 1 Visit         [facility] PT Acute Rehabilitation Services Office [phone]   Shaindel Sweeten 01/29/2024, 10:08 AM

## 2024-01-29 NOTE — Plan of Care (Signed)
   Problem: Coping: Goal: Level of anxiety will decrease Outcome: Progressing   Problem: Pain Managment: Goal: General experience of comfort will improve and/or be controlled Outcome: Progressing   Problem: Safety: Goal: Ability to remain free from injury will improve Outcome: Progressing

## 2024-01-29 NOTE — Care Management Obs Status (Signed)
 MEDICARE OBSERVATION STATUS NOTIFICATION   Patient Details  Name: [name] MRN: [id number] Date of Birth: 05/13/1951   Medicare Observation Status Notification Given:  Yes    Sheri ONEIDA Sharps, LCSW 01/29/2024, 3:50 PM

## 2024-01-29 NOTE — Evaluation (Signed)
 Occupational Therapy Evaluation Patient Details Name: [name] MRN: [id number] DOB: 06/28/1950 Today's Date: 01/29/2024   History of Present Illness   Pt s/p R THR and with hx of L THR, bil TKR, CAD, obesity, R RCR,  L ankle fusion, and lumbar spinal stenosis     Clinical Impressions Pt is s/p THA resulting in the deficits listed below (see OT Problem List).  Pt will benefit from skilled acute OT to increase their safety and independence with ADL and functional mobility for ADL to facilitate discharge.         If plan is discharge home, recommend the following:   [name] with walking and/or transfers;[name] with bathing/dressing/bathroom     Functional Status Assessment   Patient has had a recent decline in their functional status and demonstrates the ability to make significant improvements in function in a reasonable and predictable amount of time.       Precautions/Restrictions   Precautions Precautions: Fall Restrictions Weight Bearing Restrictions Per Provider Order: Yes RLE Weight Bearing Per Provider Order: Weight bearing as tolerated     Mobility Bed Mobility Overal bed mobility: Needs Assistance Bed Mobility: Sit to Supine       Sit to supine: Mod assist   General bed mobility comments: cues for sequence and use of L LE to self assist; physical assist to manage R LE and to control trunk.  Pt with pull straps and slide equipmet from home to self assist    Transfers Overall transfer level: Needs assistance Equipment used: Rolling walker (2 wheels) Transfers: Sit to/from Stand, Bed to chair/wheelchair/BSC Sit to Stand: Min assist, +2 physical assistance, +2 safety/equipment, From elevated surface     Step pivot transfers: Min assist, +2 physical assistance, +2 safety/equipment, From elevated surface     General transfer comment: cues for LE management and use of UEs to self assist; step pvt bed to chair with RW      Balance  Overall balance assessment: Needs assistance Sitting-balance support: Feet supported, No upper extremity supported Sitting balance-Leahy Scale: Good     Standing balance support: Bilateral upper extremity supported Standing balance-Leahy Scale: Poor                             ADL either performed or assessed with clinical judgement   ADL Overall ADL's : Needs assistance/impaired Eating/Feeding: Set up;Sitting   Grooming: Set up;Sitting   Upper Body Bathing: Minimal assistance;Sitting   Lower Body Bathing: Maximal assistance;Sit to/from stand;Cueing for safety;Cueing for sequencing   Upper Body Dressing : Set up;Sitting   Lower Body Dressing: Maximal assistance;Sit to/from stand;Cueing for safety;Cueing for sequencing   Toilet Transfer: Moderate assistance;+2 for safety/equipment   Toileting- Clothing Manipulation and Hygiene: Moderate assistance;Sit to/from stand;Cueing for safety;Cueing for sequencing       Functional mobility during ADLs: Moderate assistance       Vision Patient Visual Report: No change from baseline              Pertinent Vitals/Pain Pain Assessment Pain Score: 4  Pain Location: R hip and thigh Pain Descriptors / Indicators: Aching, Sore Pain Intervention(s): Limited activity within patient's tolerance     Extremity/Trunk Assessment     Lower Extremity Assessment RLE Deficits / Details: AAROM at hip to 85 flex and 15 abd; strength at hip 2+/5   Cervical / Trunk Assessment Cervical / Trunk Assessment: Normal   Communication Communication Communication:  No apparent difficulties   Cognition Arousal: Alert Behavior During Therapy: WFL for tasks assessed/performed                                 Following commands: Intact       Cueing  General Comments   Cueing Techniques: Verbal cues    Pt very pleasant and willing to participate          Home Living Family/patient expects to be discharged to::  Private residence Living Arrangements: Other relatives;Children Available Help at Discharge: Family;Available 24 hours/day Type of Home: House Home Access: Ramped entrance     Home Layout: One level     Bathroom Shower/Tub: Producer, television/film/video: Standard Bathroom Accessibility: Yes   Home Equipment: Rollator (4 wheels);Toilet riser;Grab bars - toilet          Prior Functioning/Environment Prior Level of Function : Independent/Modified Independent                    OT Problem List: Decreased strength;Pain;Decreased safety awareness;Obesity   OT Treatment/Interventions: Self-care/ADL training;Patient/family education;Therapeutic exercise;Therapeutic activities      OT Goals(Current goals can be found in the care plan section)   Acute Rehab OT Goals Patient Stated Goal: get stonger and get home OT Goal Formulation: With patient Time For Goal Achievement: 02/12/24 Potential to Achieve Goals: Good ADL Goals Pt Will Perform Lower Body Dressing: (P) with modified independence;sit to/from stand Pt Will Transfer to Toilet: (P) with modified independence Pt Will Perform Toileting - Clothing Manipulation and hygiene: (P) with modified independence;sit to/from stand Pt Will Perform Tub/Shower Transfer: (P) with modified independence   OT Frequency:  Min 2X/week       AM-PAC OT 6 Clicks Daily Activity     Outcome Measure Help from another person eating meals?: [name] from another person taking care of personal grooming?: [name] from another person toileting, which includes using toliet, bedpan, or urinal?: A Lot Help from another person bathing (including washing, rinsing, drying)?: A Lot Help from another person to put on and taking off regular upper body clothing?: [name] from another person to put on and taking off regular lower body clothing?: A Lot 6 Click Score: 15   End of Session Equipment Utilized During Treatment:  Rolling walker (2 wheels);Gait belt Nurse Communication: Mobility status  Activity Tolerance: Patient tolerated treatment well Patient left: in bed;with call bell/phone within reach;with nursing/sitter in room  OT Visit Diagnosis: Unsteadiness on feet (R26.81);Other abnormalities of gait and mobility (R26.89);Pain                   Charges:  OT Evaluation $OT Eval Low Complexity: 1 Low    [name], Norvel BIRCH 01/29/2024, 1:32 PM

## 2024-01-30 DIAGNOSIS — M1611 Unilateral primary osteoarthritis, right hip: Secondary | ICD-10-CM | POA: Diagnosis not present

## 2024-01-30 NOTE — Progress Notes (Signed)
 Physical Therapy Treatment Patient Details Name: [name] MRN: [id number] DOB: March 25, 1951 Today's Date: 01/30/2024   History of Present Illness Pt s/p R THR and with hx of L THR, bil TKR, CAD, obesity, R RCR,  L ankle fusion, and lumbar spinal stenosis    PT Comments  Pt continues very cooperative and up to ambulate limited distance in hall, reviewed plan for shallow stairs into home and reviewed car transfers.  Pt with noted improvement to CGA in standing from recliner but pt reiterates plan to utilize lift chair to sleep and to move to standing. Pt with good support of son and sister in home. Pt apprehensive but also looking forward to return home.   If plan is discharge home, recommend the following: [name] with walking and/or transfers;[name] with bathing/dressing/bathroom;Assistance with cooking/housework;Assist for transportation;Help with stairs or ramp for entrance   Can travel by private vehicle        Equipment Recommendations  Rolling walker (2 wheels)    Recommendations for Other Services       Precautions / Restrictions Precautions Precautions: Fall Restrictions Weight Bearing Restrictions Per Provider Order: No RLE Weight Bearing Per Provider Order: Weight bearing as tolerated     Mobility  Bed Mobility               General bed mobility comments: Pt up in recliner with nursing - pt repeats she plans to sleep in lift chair initially on return home    Transfers Overall transfer level: Needs assistance Equipment used: Rolling walker (2 wheels) Transfers: Sit to/from Stand Sit to Stand: Contact guard assist           General transfer comment: cues for LE management and use of UEs to self assist; Pt utilizing momentum to achieve standing on third attempt with CGA for safety only    Ambulation/Gait Ambulation/Gait assistance: Contact guard assist Gait Distance (Feet): 40 Feet Assistive device: Rolling walker (2 wheels) Gait  Pattern/deviations: Step-to pattern, Decreased step length - right, Decreased step length - left, Shuffle, Trunk flexed Gait velocity: decr     General Gait Details: min cues for sequence, posture and position from RW   Stairs Stairs:  (Pt explained stair situation at home with 2 very shallow and somewhat deep stairs into home and process she has used following other surgeries.  Unable to simulate home situation but pts plan seems valid and will have assist of son and sister.)           Wheelchair Mobility     Tilt Bed    Modified Rankin (Stroke Patients Only)       Balance Overall balance assessment: Needs assistance Sitting-balance support: Feet supported, No upper extremity supported Sitting balance-Leahy Scale: Good     Standing balance support: Bilateral upper extremity supported Standing balance-Leahy Scale: Poor                              Communication Communication Communication: No apparent difficulties  Cognition Arousal: Alert Behavior During Therapy: WFL for tasks assessed/performed   PT - Cognitive impairments: No apparent impairments                         Following commands: Intact      Cueing Cueing Techniques: Verbal cues  Exercises      General Comments        Pertinent Vitals/Pain Pain Assessment  Pain Assessment: 0-10 Pain Score: 6  Pain Location: R hip and thigh Pain Descriptors / Indicators: Aching, Sore, Burning Pain Intervention(s): Limited activity within patient's tolerance, Monitored during session, Patient requesting pain meds-RN notified    Home Living                          Prior Function            PT Goals (current goals can now be found in the care plan section) Acute Rehab PT Goals Patient Stated Goal: Regain IND PT Goal Formulation: With patient Time For Goal Achievement: 02/05/24 Potential to Achieve Goals: Good Progress towards PT goals: Progressing toward goals     Frequency    7X/week      PT Plan      Co-evaluation              AM-PAC PT 6 Clicks Mobility   Outcome Measure  Help needed turning from your back to your side while in a flat bed without using bedrails?: A Lot Help needed moving from lying on your back to sitting on the side of a flat bed without using bedrails?: A Lot Help needed moving to and from a bed to a chair (including a wheelchair)?: [name] needed standing up from a chair using your arms (e.g., wheelchair or bedside chair)?: [name] needed to walk in hospital room?: [name] needed climbing 3-5 steps with a railing? : A Lot 6 Click Score: 15    End of Session Equipment Utilized During Treatment: Gait belt Activity Tolerance: Patient tolerated treatment well Patient left: in chair;with call bell/phone within reach;with chair alarm set Nurse Communication: Mobility status PT Visit Diagnosis: Unsteadiness on feet (R26.81);Difficulty in walking, not elsewhere classified (R26.2)     Time: 8674-8652 PT Time Calculation (min) (ACUTE ONLY): 22 min  Charges:    $Gait Training: 8-22 mins PT General Charges $$ ACUTE PT VISIT: 1 Visit                     [facility] PT Acute Rehabilitation Services Office [phone]    Jacquelyn Antony 01/30/2024, 3:08 PM

## 2024-01-30 NOTE — Progress Notes (Signed)
 Patient was given discharge instructions and all questions were answered. Patient was stable for discharge and taken to the main exit by wheelchair.

## 2024-01-30 NOTE — Progress Notes (Signed)
 Patient ID: [name], female   DOB: 08/11/1950, 73 y.o.   MRN: [id number] The patient is awake and alert with family at the bedside.  She barely mobilize yesterday secondary to feeling tired and some low blood pressure.  Her vital signs are stable this morning.  We encouraged her quite a bit to be able to get up and get moving is much as possible today with therapy.  She does feel like she would do better at home.  Once therapy works with her in the morning and afternoon session if she is doing well we can discharge her to home today versus keeping her an extra day.

## 2024-01-30 NOTE — Discharge Summary (Signed)
 Patient ID: JAYMA VOLPI MRN: [id number] DOB/AGE: 1950/08/26 73 y.o.  Admit date: 01/28/2024 Discharge date: 01/30/2024  Admission Diagnoses:  Principal Problem:   Unilateral primary osteoarthritis, right hip Active Problems:   Status post total replacement of right hip   Discharge Diagnoses:  Same  Past Medical History:  Diagnosis Date   CAD (coronary artery disease)    by CT   Chronic knee pain    Complication of anesthesia    unable to urinate after right rotator cuff    Diverticulitis    Fibromyalgia    no current issues   Heart murmur    History of hiatal hernia    Mild aortic stenosis    Obesity    Osteoarthritis    Postmenopausal    Rosacea    Spinal stenosis of lumbar region     Surgeries: Procedure(s): ARTHROPLASTY, HIP, TOTAL, ANTERIOR APPROACH on 01/28/2024   Consultants:   Discharged Condition: Improved  Hospital Course: [name] is an 73 y.o. female who was admitted 01/28/2024 for operative treatment ofUnilateral primary osteoarthritis, right hip. Patient has severe unremitting pain that affects sleep, daily activities, and work/hobbies. After pre-op clearance the patient was taken to the operating room on 01/28/2024 and underwent  Procedure(s): ARTHROPLASTY, HIP, TOTAL, ANTERIOR APPROACH.    Patient was given perioperative antibiotics:  Anti-infectives (From admission, onward)    Start     Dose/Rate Route Frequency Ordered Stop   01/28/24 1400  ceFAZolin  (ANCEF ) IVPB 2g/100 mL premix        2 g 200 mL/hr over 30 Minutes Intravenous Every 6 hours 01/28/24 1044 01/28/24 2154   01/28/24 0600  ceFAZolin  (ANCEF ) IVPB 2g/100 mL premix        2 g 200 mL/hr over 30 Minutes Intravenous On call to O.R. 01/28/24 9443 01/28/24 9277        Patient was given sequential compression devices, early ambulation, and chemoprophylaxis to prevent DVT.  Inpatient Morphine Milligram Equivalents Per Day 9/12 - 9/14   Values displayed are in units of MME/Day     Order Start / End Date 9/12 Yesterday Today    oxyCODONE  (Oxy IR/ROXICODONE ) immediate release tablet 5 mg 9/12 - 9/12 7.5 of Unknown -- --    oxyCODONE  (ROXICODONE ) 5 MG/5ML solution 5 mg 9/12 - 9/12 0 of Unknown -- --      Group total: 7.5 of Unknown      fentaNYL  (SUBLIMAZE ) injection 25-50 mcg 9/12 - 9/12 45 of 45-90 -- --    meperidine  (DEMEROL ) injection 6.25-12.5 mg 9/12 - 9/12 0 of 7.5-15 -- --    fentaNYL  (SUBLIMAZE ) injection 9/12 - 9/12 *30 of 30 -- --    oxyCODONE  (Oxy IR/ROXICODONE ) immediate release tablet 5-10 mg 9/12 - 9/12 15 of 15-30 -- --    oxyCODONE  (Oxy IR/ROXICODONE ) immediate release tablet 10-15 mg 9/12 - 9/12 0 of 30-45 -- --    Daily Totals  * 97.5 of Unknown (at least 127.5-210) -- --  *One-Step medication  Calculation Errors     Order Type Date Details   oxyCODONE  (Oxy IR/ROXICODONE ) immediate release tablet 5 mg Ordered Dose -- Insufficient frequency information   oxyCODONE  (ROXICODONE ) 5 MG/5ML solution 5 mg Ordered Dose -- Insufficient frequency information            Patient benefited maximally from hospital stay and there were no complications.    Recent vital signs: Patient Vitals for the past 24 hrs:  BP Temp Temp src Pulse Resp SpO2  01/30/24 0530 114/66 99.7 F (37.6 C) Oral 87 18 100 %  01/29/24 2127 93/60 99.9 F (37.7 C) Oral 85 19 96 %  01/29/24 1848 105/66 -- -- 79 -- --     Recent laboratory studies:  Recent Labs    01/29/24 0352  WBC 10.2  HGB 12.4  HCT 40.3  PLT 179  NA 137  K 4.2  CL 104  CO2 22  BUN 9  CREATININE 0.52  GLUCOSE 141*  CALCIUM 8.9     Discharge Medications:   Allergies as of 01/30/2024       Reactions   Meloxicam Other (See Comments)   Per patient Intestional cramps        Medication List     TAKE these medications    AMBULATORY NON FORMULARY MEDICATION Continue Rolfing therapy once weekly or as needed for joint pain and/or stabilizing gait.   ANTIOXIDANT FORMULA PO Take 1 capsule by  mouth daily. Super Orac   aspirin  81 MG chewable tablet Chew 1 tablet (81 mg total) by mouth 2 (two) times daily.   colchicine 0.6 MG tablet Take 0.6 mg by mouth daily as needed (gout flares).   ELDERBERRY PO Take 1 tablet by mouth daily.   Fish Oil 1000 MG Caps Take 1,000 mg by mouth daily.   GINGER PO Take 1 capsule by mouth daily.   HYDROcodone -acetaminophen  5-325 MG tablet Commonly known as: NORCO/VICODIN Take 1-2 tablets by mouth every 6 (six) hours as needed for severe pain (pain score 7-10).   ibuprofen  200 MG tablet Commonly known as: ADVIL  Take 400 mg by mouth every 6 (six) hours as needed for moderate pain (pain score 4-6) or mild pain (pain score 1-3).   methocarbamol  500 MG tablet Commonly known as: ROBAXIN  Take 1 tablet (500 mg total) by mouth every 6 (six) hours as needed for muscle spasms.   mupirocin ointment 2 % Commonly known as: BACTROBAN Apply 1 Application topically 3 (three) times daily.   OVER THE COUNTER MEDICATION Take 3 tablets by mouth daily. Balance of Nature Fruits   OVER THE COUNTER MEDICATION Take 3 tablets by mouth daily. Balance of Nature vegetables   OVER THE COUNTER MEDICATION Take 3 mg by mouth at bedtime. Gentle Move otc supplement   tobramycin -dexamethasone  ophthalmic solution Commonly known as: TobraDex  Apply 1-2 drops to affected eye twice daily as needed. What changed: additional instructions   Turmeric Curcumin Caps Take 1 capsule by mouth daily.   VITAMIN D  PO Take 4 drops by mouth daily. liquid               Durable Medical Equipment  (From admission, onward)           Start     Ordered   01/28/24 1045  DME 3 n 1  Once        01/28/24 1044   01/28/24 1045  DME Walker rolling  Once       Question Answer Comment  Walker: With 5 Inch Wheels   Patient needs a walker to treat with the following condition Status post total replacement of right hip      01/28/24 1044            Diagnostic Studies:  DG Pelvis Portable Result Date: 01/28/2024 CLINICAL DATA:  Status post right hip replacement. EXAM: PORTABLE PELVIS 1-2 VIEWS COMPARISON:  August 23, 2020. FINDINGS: Right acetabular and femoral components are well situated. Expected postoperative changes seen in the surrounding soft tissues. IMPRESSION:  Status post right total hip arthroplasty. Electronically Signed   By: Lynwood Landy Raddle M.D.   On: 01/28/2024 11:28   DG HIP UNILAT WITH PELVIS 1V RIGHT Result Date: 01/28/2024 CLINICAL DATA:  Right hip replacement. EXAM: DG HIP (WITH OR WITHOUT PELVIS) 1V RIGHT; DG C-ARM 1-60 MIN-NO REPORT Radiation exposure index: 2.5818 mGy COMPARISON:  February 04, 2022. FINDINGS: Three intraoperative fluoroscopic images were obtained of the right hip. The right acetabular and femoral components are well situated. Expected postoperative changes are seen in the surrounding soft tissues. IMPRESSION: Fluoroscopic guidance provided during right total hip arthroplasty. Electronically Signed   By: Lynwood Landy Raddle M.D.   On: 01/28/2024 11:27   DG C-Arm 1-60 Min-No Report Result Date: 01/28/2024 CLINICAL DATA:  Right hip replacement. EXAM: DG HIP (WITH OR WITHOUT PELVIS) 1V RIGHT; DG C-ARM 1-60 MIN-NO REPORT Radiation exposure index: 2.5818 mGy COMPARISON:  February 04, 2022. FINDINGS: Three intraoperative fluoroscopic images were obtained of the right hip. The right acetabular and femoral components are well situated. Expected postoperative changes are seen in the surrounding soft tissues. IMPRESSION: Fluoroscopic guidance provided during right total hip arthroplasty. Electronically Signed   By: Lynwood Landy Raddle M.D.   On: 01/28/2024 11:27   ECHOCARDIOGRAM COMPLETE Result Date: 01/28/2024    ECHOCARDIOGRAM REPORT   Patient Name:   [name] Date of Exam: 01/27/2024 Medical Rec #:  [id number]      Height:       66.5 in Accession #:    [id number]     Weight:       247.0 lb Date of Birth:  07-Feb-1951      BSA:          2.200 m  Patient Age:    73 years       BP:           154/93 mmHg Patient Gender: F              HR:           96 bpm. Exam Location:  [facility] Procedure: 2D Echo, 3D Echo, Cardiac Doppler, Color Doppler and Strain Analysis            (Both Spectral and Color Flow Doppler were utilized during            procedure). Indications:    I35.0 Nonrheumatic aortic (valve) stenosis  History:        Patient has prior history of Echocardiogram examinations, most                 recent 05/15/2019. CAD, Pre-op clearance, Aortic Valve Disease,                 Signs/Symptoms:Murmur; Risk Factors:Current Smoker.  Sonographer:    Alan Greenhouse RDMS, RVT, RDCS Referring Phys: [id number] BRIAN S CRENSHAW  Sonographer Comments: Patient is obese. Patient positioning. IMPRESSIONS  1. Left ventricular ejection fraction, by estimation, is 60 to 65%. The left ventricle has normal function. The left ventricle has no regional wall motion abnormalities. There is mild left ventricular hypertrophy. Left ventricular diastolic parameters are indeterminate. The average left ventricular global longitudinal strain is -19.4 %.  2. Right ventricular systolic function is normal. The right ventricular size is normal. Tricuspid regurgitation signal is inadequate for assessing PA pressure.  3. The mitral valve is degenerative. Trivial mitral valve regurgitation. Mild mitral stenosis. The mean mitral valve gradient is 5.1 mmHg with average heart rate of 94 bpm. Moderate mitral annular  calcification.  4. Aortic valve regurgitation is mild. Moderate aortic valve stenosis. Aortic valve area, by VTI measures 1.42 cm, mean gradient measures 25.0 mmHg, Vmax measures 3.24 m/s.  5. The inferior vena cava is normal in size with greater than 50% respiratory variability, suggesting right atrial pressure of 3 mmHg. Comparison(s): Echocardiogram done 05/15/19 showed an EF of 60-65% with mild AS and an AV Mean Grad of 13.5 mmHg. FINDINGS  Left Ventricle: Left ventricular ejection  fraction, by estimation, is 60 to 65%. The left ventricle has normal function. The left ventricle has no regional wall motion abnormalities. The average left ventricular global longitudinal strain is -19.4 %. The left ventricular internal cavity size was normal in size. There is mild left ventricular hypertrophy. Left ventricular diastolic parameters are indeterminate. Right Ventricle: The right ventricular size is normal. Right vetricular wall thickness was not well visualized. Right ventricular systolic function is normal. Tricuspid regurgitation signal is inadequate for assessing PA pressure. Left Atrium: Left atrial size was normal in size. Right Atrium: Right atrial size was normal in size. Pericardium: There is no evidence of pericardial effusion. Mitral Valve: The mitral valve is degenerative in appearance. There is mild calcification of the mitral valve leaflet(s). Moderate mitral annular calcification. Trivial mitral valve regurgitation. Mild mitral valve stenosis. The mean mitral valve gradient is 5.1 mmHg with average heart rate of 94 bpm. Tricuspid Valve: The tricuspid valve is grossly normal. Tricuspid valve regurgitation is trivial. No evidence of tricuspid stenosis. Aortic Valve: The aortic valve is tricuspid. There is moderate calcification of the aortic valve. There is moderate thickening of the aortic valve. Aortic valve regurgitation is mild. Moderate aortic stenosis is present. Aortic valve mean gradient measures 25.0 mmHg. Aortic valve peak gradient measures 42.0 mmHg. Aortic valve area, by VTI measures 1.42 cm. Pulmonic Valve: The pulmonic valve was not well visualized. Pulmonic valve regurgitation is not visualized. No evidence of pulmonic stenosis. Aorta: The aortic root and ascending aorta are structurally normal, with no evidence of dilitation. Venous: The inferior vena cava is normal in size with greater than 50% respiratory variability, suggesting right atrial pressure of 3 mmHg.  IAS/Shunts: The interatrial septum was not well visualized.  LEFT VENTRICLE PLAX 2D LVIDd:         3.40 cm     Diastology LVIDs:         1.70 cm     LV e' medial:    6.09 cm/s LV PW:         1.20 cm     LV E/e' medial:  18.6 LV IVS:        1.50 cm     LV e' lateral:   7.40 cm/s LVOT diam:     2.09 cm     LV E/e' lateral: 15.3 LV SV:         89 LV SV Index:   40          2D Longitudinal Strain LVOT Area:     3.44 cm    2D Strain GLS (A4C):   -21.7 %                            2D Strain GLS (A3C):   -18.5 %                            2D Strain GLS (A2C):   -18.1 % LV Volumes (MOD)  2D Strain GLS Avg:     -19.4 % LV vol d, MOD A2C: 44.4 ml LV vol d, MOD A4C: 42.2 ml LV vol s, MOD A2C: 13.1 ml LV vol s, MOD A4C: 11.6 ml 3D Volume EF: LV SV MOD A2C:     31.3 ml 3D EF:        69 % LV SV MOD A4C:     42.2 ml LV EDV:       163 ml LV SV MOD BP:      31.5 ml LV ESV:       51 ml                            LV SV:        112 ml RIGHT VENTRICLE RV S prime:     7.72 cm/s TAPSE (M-mode): 2.6 cm LEFT ATRIUM             Index        RIGHT ATRIUM          Index LA diam:        3.20 cm 1.45 cm/m   RA Area:     8.02 cm LA Vol (A2C):   52.6 ml 23.91 ml/m  RA Volume:   11.90 ml 5.41 ml/m LA Vol (A4C):   38.5 ml 17.50 ml/m LA Biplane Vol: 47.6 ml 21.64 ml/m  AORTIC VALVE AV Area (Vmax):    1.32 cm AV Area (Vmean):   1.35 cm AV Area (VTI):     1.42 cm AV Vmax:           324.00 cm/s AV Vmean:          234.000 cm/s AV VTI:            0.624 m AV Peak Grad:      42.0 mmHg AV Mean Grad:      25.0 mmHg LVOT Vmax:         124.00 cm/s LVOT Vmean:        92.000 cm/s LVOT VTI:          0.258 m LVOT/AV VTI ratio: 0.41  AORTA Ao Root diam: 3.50 cm Ao Asc diam:  3.00 cm MITRAL VALVE MV Area (PHT): 5.88 cm     SHUNTS MV Mean grad:  5.1 mmHg     Systemic VTI:  0.26 m MV Decel Time: 129 msec     Systemic Diam: 2.09 cm MV E velocity: 113.00 cm/s MV A velocity: 171.00 cm/s MV E/A ratio:  0.66 Sreedhar reddy Madireddy Electronically signed  by Alean reddy Madireddy Signature Date/Time: 01/28/2024/11:18:19 AM    Final     Disposition: Discharge disposition: 01-Home or Self Care          Follow-up Information     [facility] Follow up.   Specialty: Home Health Services Why: to provide home physical therapy visits Contact information: [address] [place] [address] [phone]         Vernetta Lonni GRADE, MD Follow up in 2 week(s).   Specialty: Orthopedic Surgery Contact information: [address] [place] [address] [phone]                  Signed: Lonni GRADE Vernetta 01/30/2024, 3:54 PM

## 2024-01-30 NOTE — Progress Notes (Signed)
 Physical Therapy Treatment Patient Details Name: [name] MRN: [id number] DOB: [birth year] Today's Date: 01/30/2024   History of Present Illness Pt s/p R THR and with hx of L THR, bil TKR, CAD, obesity, R RCR,  L ankle fusion, and lumbar spinal stenosis    PT Comments  Pt continues cooperative and with marked improvement in activity tolerance and decreased level of assist noted for most tasks.  With increased time, pt up from recliner to ambulate 26' in hall - pt reports walks 35' to get into home.    If plan is discharge home, recommend the following: [name] with walking and/or transfers;[name] with bathing/dressing/bathroom;Assistance with cooking/housework;Assist for transportation;Help with stairs or ramp for entrance   Can travel by private vehicle        Equipment Recommendations  Rolling walker (2 wheels)    Recommendations for Other Services       Precautions / Restrictions Precautions Precautions: Fall Restrictions Weight Bearing Restrictions Per Provider Order: No RLE Weight Bearing Per Provider Order: Weight bearing as tolerated     Mobility  Bed Mobility               General bed mobility comments: Pt up in recliner with nursing - pt repeats she plans to sleep in lift chair initially on return home    Transfers Overall transfer level: Needs assistance Equipment used: Rolling walker (2 wheels) Transfers: Sit to/from Stand Sit to Stand: Min assist           General transfer comment: cues for LE management and use of UEs to self assist; Pt utilizing momentum to achieve standing on third attempt with min assist to bring wt up and fwd    Ambulation/Gait Ambulation/Gait assistance: Contact guard assist Gait Distance (Feet): 65 Feet Assistive device: Rolling walker (2 wheels) Gait Pattern/deviations: Step-to pattern, Decreased step length - right, Decreased step length - left, Shuffle, Trunk flexed Gait velocity: decr     General  Gait Details: min cues for sequence, posture and position from [employer]             Wheelchair Mobility     Tilt Bed    Modified Rankin (Stroke Patients Only)       Balance Overall balance assessment: Needs assistance Sitting-balance support: Feet supported, No upper extremity supported Sitting balance-Leahy Scale: Good     Standing balance support: Bilateral upper extremity supported Standing balance-Leahy Scale: Poor                              Communication Communication Communication: No apparent difficulties  Cognition Arousal: Alert Behavior During Therapy: WFL for tasks assessed/performed   PT - Cognitive impairments: No apparent impairments                         Following commands: Intact      Cueing Cueing Techniques: Verbal cues  Exercises      General Comments        Pertinent Vitals/Pain Pain Assessment Pain Assessment: 0-10 Pain Score: 4  Pain Location: R hip and thigh Pain Descriptors / Indicators: Aching, Sore, Burning Pain Intervention(s): Limited activity within patient's tolerance, Monitored during session, Premedicated before session, Ice applied    Home Living                          Prior  Function            PT Goals (current goals can now be found in the care plan section) Acute Rehab PT Goals Patient Stated Goal: Regain IND PT Goal Formulation: With patient Time For Goal Achievement: 02/05/24 Potential to Achieve Goals: Good Progress towards PT goals: Progressing toward goals    Frequency    7X/week      PT Plan      Co-evaluation              AM-PAC PT 6 Clicks Mobility   Outcome Measure  Help needed turning from your back to your side while in a flat bed without using bedrails?: A Lot Help needed moving from lying on your back to sitting on the side of a flat bed without using bedrails?: A Lot Help needed moving to and from a bed to a chair (including a  wheelchair)?: [name] needed standing up from a chair using your arms (e.g., wheelchair or bedside chair)?: [name] needed to walk in hospital room?: [name] needed climbing 3-5 steps with a railing? : A Lot 6 Click Score: 15    End of Session Equipment Utilized During Treatment: Gait belt Activity Tolerance: Patient tolerated treatment well Patient left: in chair;with call bell/phone within reach;with chair alarm set Nurse Communication: Mobility status PT Visit Diagnosis: Unsteadiness on feet (R26.81);Difficulty in walking, not elsewhere classified (R26.2)     Time: 8974-8946 PT Time Calculation (min) (ACUTE ONLY): 28 min  Charges:    $Gait Training: 23-37 mins PT General Charges $$ ACUTE PT VISIT: 1 Visit                     [facility] PT Acute Rehabilitation Services Office [phone]    [name] 01/30/2024, 2:57 PM

## 2024-01-31 ENCOUNTER — Encounter (HOSPITAL_COMMUNITY): Payer: Self-pay | Admitting: Orthopaedic Surgery

## 2024-02-10 ENCOUNTER — Other Ambulatory Visit: Payer: Self-pay | Admitting: Orthopaedic Surgery

## 2024-02-10 ENCOUNTER — Ambulatory Visit (INDEPENDENT_AMBULATORY_CARE_PROVIDER_SITE_OTHER): Admitting: Orthopaedic Surgery

## 2024-02-10 ENCOUNTER — Encounter: Payer: Self-pay | Admitting: Orthopaedic Surgery

## 2024-02-10 DIAGNOSIS — Z96641 Presence of right artificial hip joint: Secondary | ICD-10-CM

## 2024-02-10 MED ORDER — HYDROCODONE-ACETAMINOPHEN 5-325 MG PO TABS: 1.0000 | ORAL_TABLET | Freq: Four times a day (QID) | ORAL | 0 refills | Status: DC | PRN

## 2024-02-10 NOTE — Progress Notes (Signed)
 The patient is a 73 year old female who is here today for her first postoperative visit status post a right total hip replacement to treat significant right hip pain and arthritis.  She has been compliant with a baby aspirin  twice a day.  She does need a refill of hydrocodone .  Home health therapy has been to her home and we feel it would be appropriate for them to continue that for another 4 to 6 weeks to work on her balance and coordination as well as mobility.  Her right hip incision looks good.  Sutures been removed and Steri-Strips applied.  Her calf is soft.  She is been wearing compressive garments as well.  I will send in 1 more prescription of hydrocodone  but she can also take Motrin .  She can stop her baby aspirin  twice daily.  We will see her back in 4 weeks to see how she is doing overall but no x-rays are needed.

## 2024-02-23 ENCOUNTER — Encounter: Payer: Self-pay | Admitting: *Deleted

## 2024-03-13 ENCOUNTER — Encounter: Payer: Self-pay | Admitting: Orthopaedic Surgery

## 2024-03-13 ENCOUNTER — Ambulatory Visit: Admitting: Orthopaedic Surgery

## 2024-03-13 DIAGNOSIS — Z96641 Presence of right artificial hip joint: Secondary | ICD-10-CM

## 2024-03-13 MED ORDER — HYDROCODONE-ACETAMINOPHEN 5-325 MG PO TABS: 1.0000 | ORAL_TABLET | Freq: Four times a day (QID) | ORAL | 0 refills | Status: AC | PRN

## 2024-03-13 NOTE — Progress Notes (Signed)
 The patient is a 73 year old who is now 6 weeks status post a right total hip arthroplasty.  She has had a left hip replacement.  She is ambulate with a rolling walker.  She is having therapy at home and has at least 2 more weeks of home therapy.  She still feels like she cannot put full weight on her right hip and feels like there is a disconnect between her thigh and her knee.  Overall though the caregiver with her states she is absolutely making progress.  She tolerates me easily putting her right hip through range of motion with no blocks to rotation.  She says it is painful and there is some stiffness to be expected postoperatively.  She will continue home therapy for 2 weeks.  Will see her back in a month to see how she is doing overall will have a standing AP pelvis at that visit.  Remain in doubt needing to extend her home therapy versus setting her up for outpatient therapy.  I have given her encouragement that she is still making good progress and I believe will continue to do well.  She is requesting another refill of hydrocodone  which is reasonable.

## 2024-03-20 ENCOUNTER — Encounter: Payer: Self-pay | Admitting: Radiology
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
## 2024-03-29 ENCOUNTER — Encounter: Payer: Self-pay | Admitting: Cardiology

## 2024-03-29 ENCOUNTER — Ambulatory Visit: Attending: Cardiology | Admitting: Cardiology

## 2024-03-29 VITALS — BP 125/79 | HR 82 | Ht 66.5 in | Wt 246.0 lb

## 2024-03-29 DIAGNOSIS — I2510 Atherosclerotic heart disease of native coronary artery without angina pectoris: Secondary | ICD-10-CM | POA: Insufficient documentation

## 2024-03-29 DIAGNOSIS — I350 Nonrheumatic aortic (valve) stenosis: Secondary | ICD-10-CM | POA: Diagnosis not present

## 2024-03-29 NOTE — Patient Instructions (Signed)
   Testing/Procedures:  Your physician has requested that you have an echocardiogram. Echocardiography is a painless test that uses sound waves to create images of your heart. It provides your doctor with information about the size and shape of your heart and how well your heart's chambers and valves are working. This procedure takes approximately one hour. There are no restrictions for this procedure. Please do NOT wear cologne, perfume, aftershave, or lotions (deodorant is allowed). Please arrive 15 minutes prior to your appointment time.  Please note: We ask at that you not bring children with you during ultrasound (echo/ vascular) testing. Due to room size and safety concerns, children are not allowed in the ultrasound rooms during exams. Our front office staff cannot provide observation of children in our lobby area while testing is being conducted. An adult accompanying a patient to their appointment will only be allowed in the ultrasound room at the discretion of the ultrasound technician under special circumstances. We apologize for any inconvenience. [facility] IN SEPTEMBER 2026  Follow-Up: At [facility], you and your health needs are our priority.  As part of our continuing mission to provide you with exceptional heart care, our providers are all part of one team.  This team includes your primary Cardiologist (physician) and Advanced Practice Providers or APPs (Physician Assistants and Nurse Practitioners) who all work together to provide you with the care you need, when you need it.  Your next appointment:    AFTER ECHO IN [facility] 2026

## 2024-04-10 ENCOUNTER — Other Ambulatory Visit (INDEPENDENT_AMBULATORY_CARE_PROVIDER_SITE_OTHER): Payer: Self-pay

## 2024-04-10 ENCOUNTER — Ambulatory Visit: Admitting: Orthopaedic Surgery

## 2024-04-10 ENCOUNTER — Encounter: Payer: Self-pay | Admitting: Orthopaedic Surgery

## 2024-04-10 DIAGNOSIS — Z96641 Presence of right artificial hip joint: Secondary | ICD-10-CM

## 2024-04-10 NOTE — Progress Notes (Signed)
 The patient is now around 10 weeks status post a right total hip arthroplasty.  She is 73 years old and active.  We replaced her left hip back in 2022.  She does still ambulate with a rolling walker and does feel like she is turned a corner and is making good progress.  She would eventually like to get rid of the assistive devices but that is possible.  She does still report some thigh weakness on the right side which is appropriate.  She has not driven yet.  She is not taking any type of narcotic pain medication.  On exam she let me look at her hip incision on the right side and looks great.  She has full and fluid range of motion of both hips and there is really no stiffness and she is doing great overall and actually ahead of a lot of our patients at this point.  Will see her back in 6 months.  If there are issues before then she will let us  know.  Will have a standing AP pelvis at that visit.

## 2024-10-02 ENCOUNTER — Ambulatory Visit: Admitting: Orthopaedic Surgery
